# Patient Record
Sex: Female | Born: 1990 | Race: White | Hispanic: No | Marital: Single | Smoking: Former smoker
Health system: Southern US, Community
[De-identification: ages and names within clinical notes are randomized; demographics above are authoritative.]

## PROBLEM LIST (undated history)

## (undated) ENCOUNTER — Inpatient Hospital Stay: Payer: Self-pay

## (undated) DIAGNOSIS — M419 Scoliosis, unspecified: Secondary | ICD-10-CM

## (undated) DIAGNOSIS — J45909 Unspecified asthma, uncomplicated: Secondary | ICD-10-CM

## (undated) DIAGNOSIS — K589 Irritable bowel syndrome without diarrhea: Secondary | ICD-10-CM

## (undated) DIAGNOSIS — L732 Hidradenitis suppurativa: Secondary | ICD-10-CM

## (undated) DIAGNOSIS — N809 Endometriosis, unspecified: Secondary | ICD-10-CM

## (undated) DIAGNOSIS — M5430 Sciatica, unspecified side: Secondary | ICD-10-CM

## (undated) DIAGNOSIS — G932 Benign intracranial hypertension: Secondary | ICD-10-CM

## (undated) DIAGNOSIS — N1330 Unspecified hydronephrosis: Secondary | ICD-10-CM

## (undated) DIAGNOSIS — G43909 Migraine, unspecified, not intractable, without status migrainosus: Secondary | ICD-10-CM

## (undated) HISTORY — DX: Unspecified hydronephrosis: N13.30

## (undated) HISTORY — PX: TONSILLECTOMY: SUR1361

## (undated) HISTORY — PX: CHOLECYSTECTOMY: SHX55

## (undated) HISTORY — DX: Irritable bowel syndrome, unspecified: K58.9

## (undated) HISTORY — DX: Scoliosis, unspecified: M41.9

## (undated) HISTORY — PX: COLONOSCOPY: SHX174

## (undated) HISTORY — DX: Hidradenitis suppurativa: L73.2

---
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
## 2012-01-17 ENCOUNTER — Ambulatory Visit: Payer: Self-pay | Admitting: Physician Assistant

## 2012-04-20 ENCOUNTER — Ambulatory Visit: Payer: Self-pay | Admitting: Obstetrics and Gynecology

## 2012-04-20 DIAGNOSIS — M4120 Other idiopathic scoliosis, site unspecified: Secondary | ICD-10-CM | POA: Insufficient documentation

## 2012-07-04 ENCOUNTER — Ambulatory Visit: Payer: Self-pay

## 2012-09-08 ENCOUNTER — Emergency Department: Payer: Self-pay | Admitting: Emergency Medicine

## 2013-09-14 ENCOUNTER — Ambulatory Visit: Payer: Self-pay | Admitting: Physician Assistant

## 2013-11-14 ENCOUNTER — Emergency Department: Payer: Self-pay | Admitting: Emergency Medicine

## 2013-12-26 ENCOUNTER — Emergency Department: Payer: Self-pay | Admitting: Emergency Medicine

## 2014-01-02 ENCOUNTER — Emergency Department: Payer: Self-pay | Admitting: Emergency Medicine

## 2014-01-04 ENCOUNTER — Emergency Department: Payer: Self-pay | Admitting: Student

## 2014-01-05 ENCOUNTER — Emergency Department: Payer: Self-pay | Admitting: Student

## 2014-01-29 ENCOUNTER — Emergency Department: Payer: Self-pay | Admitting: Emergency Medicine

## 2014-02-24 ENCOUNTER — Emergency Department: Payer: Self-pay | Admitting: Internal Medicine

## 2014-04-01 ENCOUNTER — Emergency Department: Payer: Self-pay | Admitting: Physician Assistant

## 2014-04-04 ENCOUNTER — Emergency Department: Payer: Self-pay | Admitting: Emergency Medicine

## 2014-04-04 LAB — CBC
HCT: 45.6 % (ref 35.0–47.0)
HGB: 15.1 g/dL (ref 12.0–16.0)
MCH: 30.5 pg (ref 26.0–34.0)
MCHC: 33.1 g/dL (ref 32.0–36.0)
MCV: 92 fL (ref 80–100)
Platelet: 210 10*3/uL (ref 150–440)
RBC: 4.95 10*6/uL (ref 3.80–5.20)
RDW: 13.6 % (ref 11.5–14.5)
WBC: 8.4 10*3/uL (ref 3.6–11.0)

## 2014-04-04 LAB — GC/CHLAMYDIA PROBE AMP

## 2014-04-04 LAB — WET PREP, GENITAL

## 2014-05-25 ENCOUNTER — Emergency Department: Payer: Self-pay | Admitting: Emergency Medicine

## 2014-05-25 LAB — CBC WITH DIFFERENTIAL/PLATELET
Basophil #: 0.1 10*3/uL (ref 0.0–0.1)
Basophil %: 0.7 %
Eosinophil #: 0.4 10*3/uL (ref 0.0–0.7)
Eosinophil %: 4.4 %
HCT: 44.7 % (ref 35.0–47.0)
HGB: 15.1 g/dL (ref 12.0–16.0)
Lymphocyte #: 1.5 10*3/uL (ref 1.0–3.6)
Lymphocyte %: 18 %
MCH: 30.6 pg (ref 26.0–34.0)
MCHC: 33.9 g/dL (ref 32.0–36.0)
MCV: 91 fL (ref 80–100)
MONO ABS: 0.5 x10 3/mm (ref 0.2–0.9)
Monocyte %: 5.8 %
NEUTROS ABS: 6 10*3/uL (ref 1.4–6.5)
Neutrophil %: 71.1 %
Platelet: 228 10*3/uL (ref 150–440)
RBC: 4.94 10*6/uL (ref 3.80–5.20)
RDW: 13 % (ref 11.5–14.5)
WBC: 8.5 10*3/uL (ref 3.6–11.0)

## 2014-05-25 LAB — BASIC METABOLIC PANEL
Anion Gap: 4 — ABNORMAL LOW (ref 7–16)
BUN: 14 mg/dL
CHLORIDE: 110 mmol/L
CO2: 26 mmol/L
CREATININE: 0.81 mg/dL
Calcium, Total: 9.2 mg/dL
EGFR (Non-African Amer.): >60
Glucose: 94 mg/dL
POTASSIUM: 3.9 mmol/L
Sodium: 140 mmol/L

## 2014-06-19 ENCOUNTER — Ambulatory Visit
Admit: 2014-06-19 | Disposition: A | Discharge status: Home / Self Care | Payer: Self-pay | Attending: Family Medicine | Admitting: Family Medicine

## 2014-07-18 ENCOUNTER — Other Ambulatory Visit: Payer: Self-pay | Admitting: Family Medicine

## 2014-10-17 DIAGNOSIS — J45901 Unspecified asthma with (acute) exacerbation: Secondary | ICD-10-CM | POA: Insufficient documentation

## 2014-10-17 DIAGNOSIS — Z720 Tobacco use: Secondary | ICD-10-CM | POA: Insufficient documentation

## 2014-10-17 DIAGNOSIS — R079 Chest pain, unspecified: Secondary | ICD-10-CM | POA: Diagnosis not present

## 2014-10-17 DIAGNOSIS — F329 Major depressive disorder, single episode, unspecified: Secondary | ICD-10-CM | POA: Diagnosis not present

## 2014-10-17 DIAGNOSIS — F419 Anxiety disorder, unspecified: Secondary | ICD-10-CM | POA: Insufficient documentation

## 2014-10-18 ENCOUNTER — Emergency Department
Admission: EM | Admit: 2014-10-18 | Discharge: 2014-10-18 | Disposition: A | Discharge status: Home / Self Care | Payer: 59 | Attending: Emergency Medicine | Admitting: Emergency Medicine

## 2014-10-18 ENCOUNTER — Encounter: Payer: Self-pay | Admitting: Urgent Care

## 2014-10-18 DIAGNOSIS — F32A Depression, unspecified: Secondary | ICD-10-CM

## 2014-10-18 DIAGNOSIS — F419 Anxiety disorder, unspecified: Secondary | ICD-10-CM

## 2014-10-18 DIAGNOSIS — F329 Major depressive disorder, single episode, unspecified: Secondary | ICD-10-CM

## 2014-10-18 HISTORY — DX: Endometriosis, unspecified: N80.9

## 2014-10-18 HISTORY — DX: Unspecified asthma, uncomplicated: J45.909

## 2014-10-18 LAB — BASIC METABOLIC PANEL WITH GFR
Anion gap: 6 (ref 5–15)
BUN: 13 mg/dL (ref 6–20)
CO2: 28 mmol/L (ref 22–32)
Calcium: 9 mg/dL (ref 8.9–10.3)
Chloride: 108 mmol/L (ref 101–111)
Creatinine, Ser: 1.07 mg/dL — ABNORMAL HIGH (ref 0.44–1.00)
GFR calc Af Amer: >60 mL/min
GFR calc non Af Amer: >60 mL/min
Glucose, Bld: 79 mg/dL (ref 65–99)
Potassium: 3.8 mmol/L (ref 3.5–5.1)
Sodium: 142 mmol/L (ref 135–145)

## 2014-10-18 LAB — CBC
HCT: 42.8 % (ref 35.0–47.0)
Hemoglobin: 14.1 g/dL (ref 12.0–16.0)
MCH: 29.6 pg (ref 26.0–34.0)
MCHC: 33 g/dL (ref 32.0–36.0)
MCV: 89.9 fL (ref 80.0–100.0)
PLATELETS: 235 10*3/uL (ref 150–440)
RBC: 4.76 MIL/uL (ref 3.80–5.20)
RDW: 13.4 % (ref 11.5–14.5)
WBC: 11.3 10*3/uL — AB (ref 3.6–11.0)

## 2014-10-18 LAB — TROPONIN I: Troponin I: <0.03 ng/mL (ref <0.031)

## 2014-10-18 MED ORDER — CLONAZEPAM 0.5 MG PO TABS: 0.5000 mg | ORAL_TABLET | Freq: Three times a day (TID) | ORAL | Status: DC | PRN

## 2014-10-18 NOTE — ED Notes (Signed)
Pt seen by ED MD, see their notes for assessment.

## 2014-10-18 NOTE — Discharge Instructions (Signed)
1. You may take anxiety medicine as needed (Klonopin #15). 2. Return to the ER for worsening symptoms, persistent vomiting, difficulty breathing or other concerns. Please return to the ER if you're having thoughts of hurting herself or others.  Panic Attacks Panic attacks are sudden, short-livedsurges of severe anxiety, fear, or discomfort. They may occur for no reason when you are relaxed, when you are anxious, or when you are sleeping. Panic attacks may occur for a number of reasons:  1. Healthy people occasionally have panic attacks in extreme, life-threatening situations, such as war or natural disasters. Normal anxiety is a protective mechanism of the body that helps [place] react to danger (fight or flight response). 2. Panic attacks are often seen with anxiety disorders, such as panic disorder, social anxiety disorder, generalized anxiety disorder, and phobias. Anxiety disorders cause excessive or uncontrollable anxiety. They may interfere with your relationships or other life activities. 3. Panic attacks are sometimes seen with other mental illnesses, such as depression and posttraumatic stress disorder. 4. Certain medical conditions, prescription medicines, and drugs of abuse can cause panic attacks. SYMPTOMS  Panic attacks start suddenly, peak within 20 minutes, and are accompanied by four or more of the following symptoms:  Pounding heart or fast heart rate (palpitations).  Sweating.  Trembling or shaking.  Shortness of breath or feeling smothered.  Feeling choked.  Chest pain or discomfort.  Nausea or strange feeling in your stomach.  Dizziness, light-headedness, or feeling like you will faint.  Chills or hot flushes.  Numbness or tingling in your lips or hands and feet.  Feeling that things are not real or feeling that you are not yourself.  Fear of losing control or going crazy.  Fear of dying. Some of these symptoms can mimic serious medical conditions. For example,  you may think you are having a heart attack. Although panic attacks can be very scary, they are not life threatening. DIAGNOSIS  Panic attacks are diagnosed through an assessment by your health care provider. Your health care provider will ask questions about your symptoms, such as where and when they occurred. Your health care provider will also ask about your medical history and use of alcohol and drugs, including prescription medicines. Your health care provider may order blood tests or other studies to rule out a serious medical condition. Your health care provider may refer you to a mental health professional for further evaluation. TREATMENT   Most healthy people who have one or two panic attacks in an extreme, life-threatening situation will not require treatment.  The treatment for panic attacks associated with anxiety disorders or other mental illness typically involves counseling with a mental health professional, medicine, or a combination of both. Your health care provider will help determine what treatment is best for you.  Panic attacks due to physical illness usually go away with treatment of the illness. If prescription medicine is causing panic attacks, talk with your health care provider about stopping the medicine, decreasing the dose, or substituting another medicine.  Panic attacks due to alcohol or drug abuse go away with abstinence. Some adults need professional help in order to stop drinking or using drugs. HOME CARE INSTRUCTIONS   Take all medicines as directed by your health care provider.   Schedule and attend follow-up visits as directed by your health care provider. It is important to keep all your appointments. SEEK MEDICAL CARE IF:  You are not able to take your medicines as prescribed.  Your symptoms do not improve or  get worse. SEEK IMMEDIATE MEDICAL CARE IF:   You experience panic attack symptoms that are different than your usual symptoms.  You have  serious thoughts about hurting yourself or others.  You are taking medicine for panic attacks and have a serious side effect. MAKE SURE YOU:  Understand these instructions.  Will watch your condition.  Will get help right away if you are not doing well or get worse. Document Released: 02/17/2005 Document Revised: 02/22/2013 Document Reviewed: 10/01/2012 [facility] Patient Information 2015 [place], [place]. This information is not intended to replace advice given to you by your health care provider. Make sure you discuss any questions you have with your health care provider.  Depression Depression refers to feeling sad, low, down in the dumps, blue, gloomy, or empty. In general, there are two kinds of depression: 5. Normal sadness or normal grief. This kind of depression is one that we all feel from time to time after upsetting life experiences, such as the loss of a job or the ending of a relationship. This kind of depression is considered normal, is short lived, and resolves within a few days to 2 weeks. Depression experienced after the loss of a loved one (bereavement) often lasts longer than 2 weeks but normally gets better with time. 6. Clinical depression. This kind of depression lasts longer than normal sadness or normal grief or interferes with your ability to function at home, at work, and in school. It also interferes with your personal relationships. It affects almost every aspect of your life. Clinical depression is an illness. Symptoms of depression can also be caused by conditions other than those mentioned above, such as:  Physical illness. Some physical illnesses, including underactive thyroid gland (hypothyroidism), severe anemia, specific types of cancer, diabetes, uncontrolled seizures, heart and lung problems, strokes, and chronic pain are commonly associated with symptoms of depression.  Side effects of some prescription medicine. In some people, certain types of medicine can  cause symptoms of depression.  Substance abuse. Abuse of alcohol and illicit drugs can cause symptoms of depression. SYMPTOMS Symptoms of normal sadness and normal grief include the following:  Feeling sad or crying for short periods of time.  Not caring about anything (apathy).  Difficulty sleeping or sleeping too much.  No longer able to enjoy the things you used to enjoy.  Desire to be by oneself all the time (social isolation).  Lack of energy or motivation.  Difficulty concentrating or remembering.  Change in appetite or weight.  Restlessness or agitation. Symptoms of clinical depression include the same symptoms of normal sadness or normal grief and also the following symptoms:  Feeling sad or crying all the time.  Feelings of guilt or worthlessness.  Feelings of hopelessness or helplessness.  Thoughts of suicide or the desire to harm yourself (suicidal ideation).  Loss of touch with reality (psychotic symptoms). Seeing or hearing things that are not real (hallucinations) or having false beliefs about your life or the people around you (delusions and paranoia). DIAGNOSIS  The diagnosis of clinical depression is usually based on how bad the symptoms are and how long they have lasted. Your health care provider will also ask you questions about your medical history and substance use to find out if physical illness, use of prescription medicine, or substance abuse is causing your depression. Your health care provider may also order blood tests. TREATMENT  Often, normal sadness and normal grief do not require treatment. However, sometimes antidepressant medicine is given for bereavement to ease  the depressive symptoms until they resolve. The treatment for clinical depression depends on how bad the symptoms are but often includes antidepressant medicine, counseling with a mental health professional, or both. Your health care provider will help to determine what treatment is best  for you. Depression caused by physical illness usually goes away with appropriate medical treatment of the illness. If prescription medicine is causing depression, talk with your health care provider about stopping the medicine, decreasing the dose, or changing to another medicine. Depression caused by the abuse of alcohol or illicit drugs goes away when you stop using these substances. Some adults need professional help in order to stop drinking or using drugs. SEEK IMMEDIATE MEDICAL CARE IF:  You have thoughts about hurting yourself or others.  You lose touch with reality (have psychotic symptoms).  You are taking medicine for depression and have a serious side effect. FOR MORE INFORMATION  National Alliance on Mental Illness: www.nami.[employer] of Mental Health: http://www.maynard.net/ Document Released: 02/15/2000 Document Revised: 07/04/2013 Document Reviewed: 05/19/2011 [facility] Patient Information 2015 [place], [place]. This information is not intended to replace advice given to you by your health care provider. Make sure you discuss any questions you have with your health care provider.
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
## 2014-10-18 NOTE — ED Notes (Signed)
Patient presents with c/o chest pressure with (+) SOB and palpitations that began last night. Patient with remote h/o anxiety, however no longer is on medications.

## 2014-10-21 ENCOUNTER — Emergency Department
Admission: EM | Admit: 2014-10-21 | Discharge: 2014-10-21 | Disposition: A | Discharge status: Home / Self Care | Payer: 59 | Attending: Emergency Medicine | Admitting: Emergency Medicine

## 2014-10-21 DIAGNOSIS — M5416 Radiculopathy, lumbar region: Secondary | ICD-10-CM | POA: Insufficient documentation

## 2014-10-21 DIAGNOSIS — Z720 Tobacco use: Secondary | ICD-10-CM | POA: Diagnosis not present

## 2014-10-21 DIAGNOSIS — M545 Low back pain: Secondary | ICD-10-CM | POA: Diagnosis present

## 2014-10-21 MED ORDER — TRAMADOL HCL 50 MG PO TABS: 50.0000 mg | ORAL_TABLET | Freq: Four times a day (QID) | ORAL | Status: DC | PRN

## 2014-10-21 MED ORDER — PREDNISONE 20 MG PO TABS
60.0000 mg | ORAL_TABLET | Freq: Once | ORAL | Status: AC
  Administered 2014-10-21: 60 mg via ORAL
  Filled 2014-10-21: qty 3

## 2014-10-21 MED ORDER — PREDNISONE 10 MG PO TABS: 10.0000 mg | ORAL_TABLET | Freq: Every day | ORAL | Status: DC

## 2014-10-21 MED ORDER — TRAMADOL HCL 50 MG PO TABS
100.0000 mg | ORAL_TABLET | Freq: Once | ORAL | Status: AC
  Administered 2014-10-21: 100 mg via ORAL
  Filled 2014-10-21: qty 2

## 2014-10-21 NOTE — Discharge Instructions (Signed)
Back Exercises Back exercises help treat and prevent back injuries. The goal of back exercises is to increase the strength of your abdominal and back muscles and the flexibility of your back. These exercises should be started when you no longer have back pain. Back exercises include:  Pelvic Tilt. Lie on your back with your knees bent. Tilt your pelvis until the lower part of your back is against the floor. Hold this position 5 to 10 sec and repeat 5 to 10 times.  Knee to Chest. Pull first 1 knee up against your chest and hold for 20 to 30 seconds, repeat this with the other knee, and then both knees. This may be done with the other leg straight or bent, whichever feels better.  Sit-Ups or Curl-Ups. Bend your knees 90 degrees. Start with tilting your pelvis, and do a partial, slow sit-up, lifting your trunk only 30 to 45 degrees off the floor. Take at least 2 to 3 seconds for each sit-up. Do not do sit-ups with your knees out straight. If partial sit-ups are difficult, simply do the above but with only tightening your abdominal muscles and holding it as directed.  Hip-Lift. Lie on your back with your knees flexed 90 degrees. Push down with your feet and shoulders as you raise your hips a couple inches off the floor; hold for 10 seconds, repeat 5 to 10 times.  Back arches. Lie on your stomach, propping yourself up on bent elbows. Slowly press on your hands, causing an arch in your low back. Repeat 3 to 5 times. Any initial stiffness and discomfort should lessen with repetition over time.  Shoulder-Lifts. Lie face down with arms beside your body. Keep hips and torso pressed to floor as you slowly lift your head and shoulders off the floor. Do not overdo your exercises, especially in the beginning. Exercises may cause you some mild back discomfort which lasts for a few minutes; however, if the pain is more severe, or lasts for more than 15 minutes, do not continue exercises until you see your caregiver.  Improvement with exercise therapy for back problems is slow.  See your caregivers for assistance with developing a proper back exercise program. Document Released: 03/27/2004 Document Revised: 05/12/2011 Document Reviewed: 12/19/2010 [facility] Patient Information 2015 [place], [place]. This information is not intended to replace advice given to you by your health care provider. Make sure you discuss any questions you have with your health care provider.  Lumbosacral Radiculopathy Lumbosacral radiculopathy is a pinched nerve or nerves in the low back (lumbosacral area). When this happens you may have weakness in your legs and may not be able to stand on your toes. You may have pain going down into your legs. There may be difficulties with walking normally. There are many causes of this problem. Sometimes this may happen from an injury, or simply from arthritis or boney problems. It may also be caused by other illnesses such as diabetes. If there is no improvement after treatment, further studies may be done to find the exact cause. DIAGNOSIS  X-rays may be needed if the problems become long standing. Electromyograms may be done. This study is one in which the working of nerves and muscles is studied. HOME CARE INSTRUCTIONS   Applications of ice packs may be helpful. Ice can be used in a plastic bag with a towel around it to prevent frostbite to skin. This may be used every 2 hours for 20 to 30 minutes, or as needed, while awake, or  as directed by your caregiver. °· Only take over-the-counter or prescription medicines for pain, discomfort, or fever as directed by your caregiver. °· If physical therapy was prescribed, follow your caregiver's directions. °SEEK IMMEDIATE MEDICAL CARE IF:  °· You have pain not controlled with medications. °· You seem to be getting worse rather than better. °· You develop increasing weakness in your legs. °· You develop loss of bowel or bladder control. °· You have difficulty with  walking or balance, or develop clumsiness in the use of your legs. °· You have a fever. °MAKE SURE YOU:  °· Understand these instructions. °· Will watch your condition. °· Will get help right away if you are not doing well or get worse. °Document Released: 02/17/2005 Document Revised: 05/12/2011 Document Reviewed: 10/08/2007 °[employer]® Patient Information ©2015 [employer]. This information is not intended to replace advice given to you by your health care provider. Make sure you discuss any questions you have with your health care provider. ° °

## 2014-10-21 NOTE — ED Notes (Signed)
Pt c/o lower back pain radiating down left leg since last night. Pain has continued to increase. Reports lifting pt at work prior to symptoms.

## 2014-10-21 NOTE — ED Provider Notes (Signed)
CSN: [id number]     Arrival date & time 10/21/14  1951 History   First MD Initiated Contact with Patient 10/21/14 2236     Chief Complaint  Patient presents with  . Back Pain    Pt c/o pain to lower back radiating down left leg since Friday night. Pain has continued to increase. Reports lifting pt at work prior to symptoms.     (Consider location/radiation/quality/duration/timing/severity/associated sxs/prior Treatment) HPI  24 year old female presents to the emergency department for evaluation of left lower back pain with radiation down the left posterior lateral thigh. Symptoms have been present for 1 day. She describes the pain as pain numbness and tingling. Pain is constant and increased with activity No trauma or injury. She has had spinal fusion due to scoliosis and 2008. No complications from the surgery. She denies any weakness, abdominal pain, dysuria, loss of bowel or bladder symptoms. She's been taking Tylenol without any relief.  Past Medical History  Diagnosis Date  . Asthma   . Endometriosis    Past Surgical History  Procedure Laterality Date  . Spinal fusion    . Tonsillectomy     No family history on file. Social History  Substance Use Topics  . Smoking status: Current Every Day Smoker  . Smokeless tobacco: Not on file  . Alcohol Use: Yes   OB History    No data available     Review of Systems  Constitutional: Negative for fever, chills, activity change and fatigue.  HENT: Negative for congestion, sinus pressure and sore throat.   Eyes: Negative for visual disturbance.  Respiratory: Negative for cough, chest tightness and shortness of breath.   Cardiovascular: Negative for chest pain and leg swelling.  Gastrointestinal: Negative for nausea, vomiting, abdominal pain and diarrhea.  Genitourinary: Negative for dysuria.  Musculoskeletal: Positive for back pain. Negative for arthralgias and gait problem.  Skin: Negative for rash.  Neurological: Negative for  weakness, numbness and headaches.  Hematological: Negative for adenopathy.  Psychiatric/Behavioral: Negative for behavioral problems, confusion and agitation.      Allergies  Other  Home Medications   Prior to Admission medications   Medication Sig Start Date End Date Taking? Authorizing Provider  clonazePAM (KLONOPIN) 0.5 MG tablet Take 1 tablet (0.5 mg total) by mouth 3 (three) times daily as needed for anxiety. 10/18/14 10/18/15  Irean Hong, MD  predniSONE (DELTASONE) 10 MG tablet Take 1 tablet (10 mg total) by mouth daily. 6,5,4,3,2,1 six day taper 10/21/14   Evon Slack, PA-C  traMADol (ULTRAM) 50 MG tablet Take 1 tablet (50 mg total) by mouth every 6 (six) hours as needed. 10/21/14   Evon Slack, PA-C   BP 148/86 mmHg  Pulse 84  Temp(Src) 98 F (36.7 C) (Oral)  Resp 18  Ht 5\' 6"  (1.676 m)  Wt 220 lb (99.791 kg)  BMI 35.53 kg/m2  SpO2 99% Physical Exam  Constitutional: She is oriented to person, place, and time. She appears well-developed and well-nourished. No distress.  HENT:  Head: Normocephalic and atraumatic.  Mouth/Throat: Oropharynx is clear and moist.  Eyes: EOM are normal. Pupils are equal, round, and reactive to light. Right eye exhibits no discharge. Left eye exhibits no discharge.  Neck: Normal range of motion. Neck supple.  Cardiovascular: Normal rate, regular rhythm and intact distal pulses.   Pulmonary/Chest: Effort normal and breath sounds normal. No respiratory distress. She exhibits no tenderness.  Abdominal: Soft. She exhibits no distension. There is no tenderness.  Musculoskeletal:  Examination of the lumbar spine shows patient has no spinous process tenderness. There is left paravertebral muscle tenderness at the lumbosacral junction. She has lived range of motion of the lumbar spine with flexion and extension. Examination of the lower extremity shows patient has full range of motion of the hips knees and ankles. She is able to stand. 2+ patellar  reflexes. No weakness of the lower extremities. No sensation loss.  Neurological: She is alert and oriented to person, place, and time. She has normal reflexes.  Skin: Skin is warm and dry.  Psychiatric: She has a normal mood and affect. Her behavior is normal. Thought content normal.    ED Course  Procedures (including critical care time) Labs Review Labs Reviewed - No data to display  Imaging Review No results found. I have personally reviewed and evaluated these images and lab results as part of my medical decision-making.   EKG Interpretation None      MDM   Final diagnoses:  Acute left lumbar radiculopathy    24 year old female with 1 day history of acute left lumbar radiculopathy. She has pain numbness tingling rating down the left L5 nerve distribution without weakness or neurological deficits on exam. Patient was given 6 day prednisone taper and tramadol for pain and inflammation. She'll follow-up with orthopedics in 5-7 days if no improvement. She will return to the ER for any worsening symptoms urgent changes in her health    Evon Slack, PA-C 10/21/14 2259  Loleta Rose, MD 10/22/14 [phone]

## 2014-10-26 ENCOUNTER — Emergency Department
Admission: EM | Admit: 2014-10-26 | Discharge: 2014-10-27 | Disposition: A | Discharge status: Home / Self Care | Payer: 59 | Attending: Emergency Medicine | Admitting: Emergency Medicine

## 2014-10-26 DIAGNOSIS — M545 Low back pain: Secondary | ICD-10-CM | POA: Diagnosis present

## 2014-10-26 DIAGNOSIS — Z720 Tobacco use: Secondary | ICD-10-CM | POA: Diagnosis not present

## 2014-10-26 DIAGNOSIS — Z791 Long term (current) use of non-steroidal anti-inflammatories (NSAID): Secondary | ICD-10-CM | POA: Diagnosis not present

## 2014-10-26 DIAGNOSIS — M5416 Radiculopathy, lumbar region: Secondary | ICD-10-CM | POA: Diagnosis not present

## 2014-10-26 DIAGNOSIS — Z3202 Encounter for pregnancy test, result negative: Secondary | ICD-10-CM | POA: Diagnosis not present

## 2014-10-26 NOTE — ED Notes (Signed)
Pt complains of back pain since around the first time she came here on the 17th. She says the pain is getting progressively worse. It is going into left leg and states 'her finger tips and hand will start tingling' states it just stays on left side. States her 'left butt check even tingles' Pt states the last time she was here they said she had a pinched nerve and gave tramadol and prednisone. Pt says it has not touched the pain. Pt states she has taken Epson salt baths as well

## 2014-10-27 LAB — URINALYSIS COMPLETE WITH MICROSCOPIC (ARMC ONLY)
BILIRUBIN URINE: NEGATIVE
GLUCOSE, UA: NEGATIVE mg/dL
Hgb urine dipstick: NEGATIVE
Ketones, ur: NEGATIVE mg/dL
Leukocytes, UA: NEGATIVE
Nitrite: NEGATIVE
Protein, ur: NEGATIVE mg/dL
Specific Gravity, Urine: 1.025 (ref 1.005–1.030)
pH: 5 (ref 5.0–8.0)

## 2014-10-27 LAB — POCT PREGNANCY, URINE: PREG TEST UR: NEGATIVE

## 2014-10-27 MED ORDER — CYCLOBENZAPRINE HCL 10 MG PO TABS: 10.0000 mg | ORAL_TABLET | Freq: Three times a day (TID) | ORAL | Status: DC | PRN

## 2014-10-27 MED ORDER — CYCLOBENZAPRINE HCL 10 MG PO TABS
10.0000 mg | ORAL_TABLET | Freq: Once | ORAL | Status: AC
  Administered 2014-10-27: 10 mg via ORAL
  Filled 2014-10-27: qty 1

## 2014-10-27 MED ORDER — KETOROLAC TROMETHAMINE 10 MG PO TABS
10.0000 mg | ORAL_TABLET | Freq: Once | ORAL | Status: AC
  Administered 2014-10-27: 10 mg via ORAL
  Filled 2014-10-27: qty 1

## 2014-10-27 NOTE — Discharge Instructions (Signed)

## 2014-10-27 NOTE — ED Notes (Signed)
Pt reports severe lower left back pain w/ radiation down left leg. 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 Pt is A&O, in NAD, respirations even and unlabored, with s/o at bedside.

## 2014-10-27 NOTE — ED Provider Notes (Signed)
[facility] Emergency Department Provider Note  ____________________________________________  Time seen: 1:15 AM  I have reviewed the triage vital signs and the nursing notes.   HISTORY  Chief Complaint Back Pain     HPI Courtney Sanchez is a 24 y.o. female presents with left low back pain with radiation down the posterior left leg 6 days. Patient denies any leg weakness. Of note patient was seen on 10/21/2014 for the same and referred to PMD/orthopedist however patient was unable to make that appointment.     Past Medical History  Diagnosis Date  . Asthma   . Endometriosis     There are no active problems to display for this patient.   Past Surgical History  Procedure Laterality Date  . Spinal fusion    . Tonsillectomy      Current Outpatient Rx  Name  Route  Sig  Dispense  Refill  . clonazePAM (KLONOPIN) 0.5 MG tablet   Oral   Take 1 tablet (0.5 mg total) by mouth 3 (three) times daily as needed for anxiety.   15 tablet   0   . predniSONE (DELTASONE) 10 MG tablet   Oral   Take 1 tablet (10 mg total) by mouth daily. 6,5,4,3,2,1 six day taper   21 tablet   0   . traMADol (ULTRAM) 50 MG tablet   Oral   Take 1 tablet (50 mg total) by mouth every 6 (six) hours as needed.   20 tablet   0     Allergies Other  No family history on file.  Social History Social History  Substance Use Topics  . Smoking status: Current Every Day Smoker  . Smokeless tobacco: None  . Alcohol Use: Yes    Review of Systems  Constitutional: Negative for fever. Eyes: Negative for visual changes. ENT: Negative for sore throat. Cardiovascular: Negative for chest pain. Respiratory: Negative for shortness of breath. Gastrointestinal: Negative for abdominal pain, vomiting and diarrhea. Genitourinary: Negative for dysuria. Musculoskeletal: Positive for back pain. Skin: Negative for rash. Neurological: Negative for headaches, focal weakness or  numbness.   10-point ROS otherwise negative.  ____________________________________________   PHYSICAL EXAM:  VITAL SIGNS: ED Triage Vitals  Enc Vitals Group     BP 10/26/14 2220 130/69 mmHg     Pulse Rate 10/26/14 2220 87     Resp 10/27/14 0050 16     Temp 10/26/14 2220 98.4 F (36.9 C)     Temp Source 10/26/14 2220 Oral     SpO2 10/26/14 2220 98 %     Weight 10/26/14 2220 220 lb (99.791 kg)     Height 10/26/14 2220 5\' 6"  (1.676 m)     Head Cir --      Peak Flow --      Pain Score 10/26/14 2220 8     Pain Loc --      Pain Edu? --      Excl. in GC? --      Constitutional: Alert and oriented. Well appearing and in no distress. Eyes: Conjunctivae are normal. PERRL. Normal extraocular movements. ENT   Head: Normocephalic and atraumatic.   Nose: No congestion/rhinnorhea.   Mouth/Throat: Mucous membranes are moist.   Neck: No stridor. Cardiovascular: Normal rate, regular rhythm. Normal and symmetric distal pulses are present in all extremities. No murmurs, rubs, or gallops. Respiratory: Normal respiratory effort without tachypnea nor retractions. Breath sounds are clear and equal bilaterally. No wheezes/rales/rhonchi. Gastrointestinal: Soft and nontender. No distention. There is  no CVA tenderness. Genitourinary: deferred Musculoskeletal: Nontender with normal range of motion in all extremities. No joint effusions.  No lower extremity tenderness nor edema. No palpation left paraspinal muscles in the lumbar region. Neurologic:  Normal speech and language. No gross focal neurologic deficits are appreciated. Speech is normal.  Skin:  Skin is warm, dry and intact. No rash noted. Psychiatric: Mood and affect are normal. Speech and behavior are normal. Patient exhibits appropriate insight and judgment.  ____________________________________________    LABS (pertinent positives/negatives)  Labs Reviewed  URINALYSIS COMPLETEWITH MICROSCOPIC ([facility] ONLY) - Abnormal;  Notable for the following:    Color, Urine YELLOW (*)    APPearance CLOUDY (*)    Bacteria, UA RARE (*)    Squamous Epithelial / LPF 6-30 (*)    All other components within normal limits     INITIAL IMPRESSION / ASSESSMENT AND PLAN / ED COURSE  Pertinent labs & imaging results that were available during my care of the patient were reviewed by me and considered in my medical decision making (see chart for details).    ____________________________________________   FINAL CLINICAL IMPRESSION(S) / ED DIAGNOSES  Final diagnoses:  Lumbar radiculopathy, acute      Darci Current, MD 10/27/14 [phone]

## 2014-10-27 NOTE — ED Notes (Signed)
POC Urine preg resulted= NEGATIVE 

## 2014-10-27 NOTE — ED Notes (Signed)
MD at bedside. 

## 2014-12-20 ENCOUNTER — Emergency Department
Admission: EM | Admit: 2014-12-20 | Discharge: 2014-12-20 | Disposition: A | Discharge status: Home / Self Care | Payer: 59 | Attending: Emergency Medicine | Admitting: Emergency Medicine

## 2014-12-20 ENCOUNTER — Encounter: Payer: Self-pay | Admitting: *Deleted

## 2014-12-20 DIAGNOSIS — Z720 Tobacco use: Secondary | ICD-10-CM | POA: Insufficient documentation

## 2014-12-20 DIAGNOSIS — R21 Rash and other nonspecific skin eruption: Secondary | ICD-10-CM | POA: Diagnosis present

## 2014-12-20 DIAGNOSIS — B356 Tinea cruris: Secondary | ICD-10-CM | POA: Diagnosis not present

## 2014-12-20 MED ORDER — NAFTIFINE HCL 1 % EX CREA: TOPICAL_CREAM | Freq: Every day | CUTANEOUS | Status: DC

## 2014-12-20 NOTE — ED Provider Notes (Signed)
[facility] Emergency Department Provider Note  ____________________________________________  Time seen: Approximately 6:49 PM  I have reviewed the triage vital signs and the nursing notes.   HISTORY  Chief Complaint Rash    HPI Courtney Sanchez is a 24 y.o. female presents for evaluation of a rash in her groin area 2 weeks.   Past Medical History  Diagnosis Date  . Asthma   . Endometriosis     There are no active problems to display for this patient.   Past Surgical History  Procedure Laterality Date  . Spinal fusion    . Tonsillectomy      Current Outpatient Rx  Name  Route  Sig  Dispense  Refill  . clonazePAM (KLONOPIN) 0.5 MG tablet   Oral   Take 1 tablet (0.5 mg total) by mouth 3 (three) times daily as needed for anxiety.   15 tablet   0   . cyclobenzaprine (FLEXERIL) 10 MG tablet   Oral   Take 1 tablet (10 mg total) by mouth 3 (three) times daily as needed for muscle spasms.   30 tablet   0   . naftifine (NAFTIN) 1 % cream   Topical   Apply topically daily.   60 g   0   . predniSONE (DELTASONE) 10 MG tablet   Oral   Take 1 tablet (10 mg total) by mouth daily. 6,5,4,3,2,1 six day taper   21 tablet   0   . traMADol (ULTRAM) 50 MG tablet   Oral   Take 1 tablet (50 mg total) by mouth every 6 (six) hours as needed.   20 tablet   0     Allergies Other  No family history on file.  Social History Social History  Substance Use Topics  . Smoking status: Current Every Day Smoker  . Smokeless tobacco: None  . Alcohol Use: Yes    Review of Systems Constitutional: No fever/chills Eyes: No visual changes. ENT: No sore throat. Cardiovascular: Denies chest pain. Respiratory: Denies shortness of breath. Gastrointestinal: No abdominal pain.  No nausea, no vomiting.  No diarrhea.  No constipation. Genitourinary: Negative for dysuria. Musculoskeletal: Negative for back pain. Skin: Positive for rash in her  groin Neurological: Negative for headaches, focal weakness or numbness.  10-point ROS otherwise negative.  ____________________________________________   PHYSICAL EXAM:  VITAL SIGNS: ED Triage Vitals  Enc Vitals Group     BP 12/20/14 1746 133/85 mmHg     Pulse Rate 12/20/14 1746 100     Resp 12/20/14 1746 20     Temp 12/20/14 1746 98.6 F (37 C)     Temp Source 12/20/14 1746 Oral     SpO2 12/20/14 1746 98 %     Weight 12/20/14 1746 220 lb (99.791 kg)     Height 12/20/14 1746  (1.676 m)     Head Cir --      Peak Flow --      Pain Score 12/20/14 1747 5     Pain Loc --      Pain Edu? --      Excl. in GC? --     Constitutional: Alert and oriented. Well appearing and in no acute distress. Neurologic:  Normal speech and language. No gross focal neurologic deficits are appreciated. No gait instability. Skin:  Skin is warm, dry and intact. Erythematous rash noted on both groins with sharp demarcated borders. Psychiatric: Mood and affect are normal. Speech and behavior are normal.  ____________________________________________  LABS (all labs ordered are listed, but only abnormal results are displayed)  Labs Reviewed - No data to display ____________________________________________   PROCEDURES  Procedure(s) performed: None  Critical Care performed: No  ____________________________________________   INITIAL IMPRESSION / ASSESSMENT AND PLAN / ED COURSE  Pertinent labs & imaging results that were available during my care of the patient were reviewed by me and considered in my medical decision making (see chart for details).  Tinea cruris. Rx given for Naftin cream patient to follow up with PCP or return to ER with any worsening symptomology. Patient voices no other emergency medical complaint at this visit. ____________________________________________   FINAL CLINICAL IMPRESSION(S) / ED DIAGNOSES  Final diagnoses:  Tinea cruris      Evangeline Dakinharles M Taiga Lupinacci,  PA-C 12/20/14 1854  Jeanmarie PlantJames A McShane, MD 12/20/14 [phone]

## 2014-12-20 NOTE — ED Notes (Signed)
Pt reports a rash on inner thighs with pain and burning.  Sx for 2 weeks. Sx worse now.

## 2015-01-11 ENCOUNTER — Emergency Department
Admission: EM | Admit: 2015-01-11 | Discharge: 2015-01-12 | Disposition: A | Discharge status: Home / Self Care | Payer: 59 | Attending: Emergency Medicine | Admitting: Emergency Medicine

## 2015-01-11 DIAGNOSIS — J45909 Unspecified asthma, uncomplicated: Secondary | ICD-10-CM | POA: Insufficient documentation

## 2015-01-11 DIAGNOSIS — Z3202 Encounter for pregnancy test, result negative: Secondary | ICD-10-CM | POA: Diagnosis not present

## 2015-01-11 DIAGNOSIS — Z79899 Other long term (current) drug therapy: Secondary | ICD-10-CM | POA: Diagnosis not present

## 2015-01-11 DIAGNOSIS — Z720 Tobacco use: Secondary | ICD-10-CM | POA: Diagnosis not present

## 2015-01-11 DIAGNOSIS — J209 Acute bronchitis, unspecified: Secondary | ICD-10-CM

## 2015-01-11 DIAGNOSIS — R05 Cough: Secondary | ICD-10-CM | POA: Diagnosis present

## 2015-01-11 NOTE — ED Notes (Signed)
Pt in with co cough since last week, now having rib pain.  Also co chills and unsure of fever.

## 2015-01-12 ENCOUNTER — Emergency Department: Payer: 59

## 2015-01-12 ENCOUNTER — Encounter: Payer: Self-pay | Admitting: Emergency Medicine

## 2015-01-12 LAB — POCT PREGNANCY, URINE: Preg Test, Ur: NEGATIVE

## 2015-01-12 LAB — INFLUENZA PANEL BY PCR (TYPE A & B)
H1N1FLUPCR: NOT DETECTED
Influenza A By PCR: NEGATIVE
Influenza B By PCR: NEGATIVE

## 2015-01-12 MED ORDER — DOXYCYCLINE HYCLATE 100 MG PO TABS: 100.0000 mg | ORAL_TABLET | Freq: Two times a day (BID) | ORAL | Status: AC

## 2015-01-12 MED ORDER — DOXYCYCLINE HYCLATE 100 MG PO TABS
ORAL_TABLET | ORAL | Status: AC
  Administered 2015-01-12: 100 mg via ORAL
  Filled 2015-01-12: qty 1

## 2015-01-12 MED ORDER — ACETAMINOPHEN-CODEINE #3 300-30 MG PO TABS: 1.0000 | ORAL_TABLET | ORAL | Status: DC | PRN

## 2015-01-12 MED ORDER — ALBUTEROL SULFATE HFA 108 (90 BASE) MCG/ACT IN AERS: 2.0000 | INHALATION_SPRAY | Freq: Four times a day (QID) | RESPIRATORY_TRACT | Status: DC | PRN

## 2015-01-12 MED ORDER — AZITHROMYCIN 250 MG PO TABS
500.0000 mg | ORAL_TABLET | Freq: Once | ORAL | Status: DC
  Filled 2015-01-12: qty 2

## 2015-01-12 MED ORDER — DOXYCYCLINE HYCLATE 100 MG PO TABS
100.0000 mg | ORAL_TABLET | Freq: Once | ORAL | Status: AC
  Administered 2015-01-12: 100 mg via ORAL

## 2015-01-12 MED ORDER — ACETAMINOPHEN-CODEINE #3 300-30 MG PO TABS
1.0000 | ORAL_TABLET | Freq: Once | ORAL | Status: AC
  Administered 2015-01-12: 1 via ORAL
  Filled 2015-01-12: qty 1

## 2015-01-12 MED ORDER — PREDNISONE 20 MG PO TABS
60.0000 mg | ORAL_TABLET | Freq: Once | ORAL | Status: AC
  Administered 2015-01-12: 60 mg via ORAL
  Filled 2015-01-12: qty 3

## 2015-01-12 NOTE — ED Notes (Signed)
Patient back from  X-ray 

## 2015-01-12 NOTE — ED Notes (Signed)
Patient transported to X-ray 

## 2015-01-12 NOTE — Discharge Instructions (Signed)

## 2015-01-12 NOTE — ED Provider Notes (Signed)
[facility] Emergency Department Provider Note  ____________________________________________  Time seen: 12:00 AM  I have reviewed the triage vital signs and the nursing notes.   HISTORY  Chief Complaint Cough     HPI Katarina Arwa Yero is a 24 y.o. female presents with cough and chills times one week as well as bilateral rib pain. Patient admits to smoking cigarettes 1 pack per day.     Past Medical History  Diagnosis Date  . Asthma   . Endometriosis     There are no active problems to display for this patient.   Past Surgical History  Procedure Laterality Date  . Spinal fusion    . Tonsillectomy      Current Outpatient Rx  Name  Route  Sig  Dispense  Refill  . clonazePAM (KLONOPIN) 0.5 MG tablet   Oral   Take 1 tablet (0.5 mg total) by mouth 3 (three) times daily as needed for anxiety.   15 tablet   0   . cyclobenzaprine (FLEXERIL) 10 MG tablet   Oral   Take 1 tablet (10 mg total) by mouth 3 (three) times daily as needed for muscle spasms.   30 tablet   0   . naftifine (NAFTIN) 1 % cream   Topical   Apply topically daily.   60 g   0   . predniSONE (DELTASONE) 10 MG tablet   Oral   Take 1 tablet (10 mg total) by mouth daily. 6,5,4,3,2,1 six day taper   21 tablet   0   . traMADol (ULTRAM) 50 MG tablet   Oral   Take 1 tablet (50 mg total) by mouth every 6 (six) hours as needed.   20 tablet   0     Allergies Other  No family history on file.  Social History Social History  Substance Use Topics  . Smoking status: Current Every Day Smoker  . Smokeless tobacco: None  . Alcohol Use: Yes    Review of Systems  Constitutional: Negative for fever. Positive for chills Eyes: Negative for visual changes. ENT: Negative for sore throat. Cardiovascular: Negative for chest pain. Respiratory: Positive for cough Gastrointestinal: Negative for abdominal pain, vomiting and diarrhea. Genitourinary: Negative for  dysuria. Musculoskeletal: Negative for back pain. Skin: Negative for rash. Neurological: Negative for headaches, focal weakness or numbness.   10-point ROS otherwise negative.  ____________________________________________   PHYSICAL EXAM:  VITAL SIGNS: ED Triage Vitals  Enc Vitals Group     BP 01/11/15 2336 120/78 mmHg     Pulse Rate 01/11/15 2336 90     Resp 01/11/15 2336 18     Temp 01/11/15 2336 98.7 F (37.1 C)     Temp Source 01/11/15 2336 Oral     SpO2 01/11/15 2336 95 %     Weight 01/11/15 2336 230 lb (104.327 kg)     Height 01/11/15 2336  (1.651 m)     Head Cir --      Peak Flow --      Pain Score 01/11/15 2336 7     Pain Loc --      Pain Edu? --      Excl. in GC? --      Constitutional: Alert and oriented. Well appearing and in no distress. Eyes: Conjunctivae are normal. PERRL. Normal extraocular movements. ENT   Head: Normocephalic and atraumatic.   Nose: No congestion/rhinnorhea.   Mouth/Throat: Mucous membranes are moist.   Neck: No stridor. Hematological/Lymphatic/Immunilogical: No cervical lymphadenopathy.  Cardiovascular: Normal rate, regular rhythm. Normal and symmetric distal pulses are present in all extremities. No murmurs, rubs, or gallops. Respiratory: Normal respiratory effort without tachypnea nor retractions. Breath sounds are clear and equal bilaterally. No wheezes/rales/rhonchi. Gastrointestinal: Soft and nontender. No distention. There is no CVA tenderness. Genitourinary: deferred Musculoskeletal: Nontender with normal range of motion in all extremities. No joint effusions.  No lower extremity tenderness nor edema. Neurologic:  Normal speech and language. No gross focal neurologic deficits are appreciated. Speech is normal.  Skin:  Skin is warm, dry and intact. No rash noted. Psychiatric: Mood and affect are normal. Speech and behavior are normal. Patient exhibits appropriate insight and judgment.    RADIOLOGY      DG  Chest 2 View (Final result) Result time: 01/12/15 00:46:56   Final result by Rad Results In Interface (01/12/15 00:46:56)   Narrative:   CLINICAL DATA: Cough and chills  EXAM: CHEST 2 VIEW  COMPARISON: 01/17/2012  FINDINGS: Normal heart size and mediastinal contours. No acute infiltrate or edema. No effusion or pneumothorax. Scoliosis with post thoracolumbar fixation. No acute osseous findings.  IMPRESSION: No acute finding.   Electronically Signed By: Marnee SpringJonathon Watts M.D. On: 01/12/2015 00:46        ECG Results        INITIAL IMPRESSION / ASSESSMENT AND PLAN / ED COURSE  Pertinent labs & imaging results that were available during my care of the patient were reviewed by me and considered in my medical decision making (see chart for details).  History and physical exam consistent with acute bronchitis. Patient received Tylenol with Codeine azithromycin and prednisone.  ____________________________________________   FINAL CLINICAL IMPRESSION(S) / ED DIAGNOSES  Final diagnoses:  Acute bronchitis, unspecified organism      Darci Currentandolph N Juanantonio Stolar, MD 01/12/15 [phone]

## 2015-03-07 ENCOUNTER — Emergency Department
Admission: EM | Admit: 2015-03-07 | Discharge: 2015-03-07 | Disposition: A | Discharge status: Home / Self Care | Payer: 59 | Attending: Emergency Medicine | Admitting: Emergency Medicine

## 2015-03-07 ENCOUNTER — Encounter: Payer: Self-pay | Admitting: Urgent Care

## 2015-03-07 DIAGNOSIS — F17200 Nicotine dependence, unspecified, uncomplicated: Secondary | ICD-10-CM | POA: Insufficient documentation

## 2015-03-07 DIAGNOSIS — J069 Acute upper respiratory infection, unspecified: Secondary | ICD-10-CM | POA: Insufficient documentation

## 2015-03-07 DIAGNOSIS — J45909 Unspecified asthma, uncomplicated: Secondary | ICD-10-CM | POA: Insufficient documentation

## 2015-03-07 DIAGNOSIS — R05 Cough: Secondary | ICD-10-CM | POA: Diagnosis present

## 2015-03-07 HISTORY — DX: Migraine, unspecified, not intractable, without status migrainosus: G43.909

## 2015-03-07 MED ORDER — GUAIFENESIN-CODEINE 100-10 MG/5ML PO SOLN: 10.0000 mL | ORAL | Status: DC | PRN

## 2015-03-07 MED ORDER — AZITHROMYCIN 250 MG PO TABS: ORAL_TABLET | ORAL | Status: DC

## 2015-03-07 MED ORDER — PSEUDOEPHEDRINE HCL 60 MG PO TABS: 60.0000 mg | ORAL_TABLET | ORAL | Status: DC | PRN

## 2015-03-07 MED ORDER — IBUPROFEN 800 MG PO TABS: 800.0000 mg | ORAL_TABLET | Freq: Three times a day (TID) | ORAL | Status: DC | PRN

## 2015-03-07 NOTE — ED Provider Notes (Signed)
[facility] Emergency Department Provider Note  ____________________________________________  Time seen: Approximately 8:40 PM  I have reviewed the triage vital signs and the nursing notes.   HISTORY  Chief Complaint Cough    HPI Courtney Dennard NipRenee Bucker is a 25 y.o. female resents for evaluation of cough and chest congestion and head congestion and back pain for 3 days. Patient complains of a sore throat secondary to coughing so much. Reports fever on and off. Has been taking Tylenol at home.   Past Medical History  Diagnosis Date  . Asthma   . Endometriosis   . Migraine     There are no active problems to display for this patient.   Past Surgical History  Procedure Laterality Date  . Spinal fusion    . Tonsillectomy      Current Outpatient Rx  Name  Route  Sig  Dispense  Refill  . albuterol (PROVENTIL HFA;VENTOLIN HFA) 108 (90 BASE) MCG/ACT inhaler   Inhalation   Inhale 2 puffs into the lungs every 6 (six) hours as needed for wheezing or shortness of breath.   1 Inhaler   2   . azithromycin (ZITHROMAX Z-PAK) 250 MG tablet      Take 2 tablets (500 mg) on  Day 1,  followed by 1 tablet (250 mg) once daily on Days 2 through 5.   6 each   0   . clonazePAM (KLONOPIN) 0.5 MG tablet   Oral   Take 1 tablet (0.5 mg total) by mouth 3 (three) times daily as needed for anxiety.   15 tablet   0   . guaiFENesin-codeine 100-10 MG/5ML syrup   Oral   Take 10 mLs by mouth every 4 (four) hours as needed for cough.   180 mL   0   . ibuprofen (ADVIL,MOTRIN) 800 MG tablet   Oral   Take 1 tablet (800 mg total) by mouth every 8 (eight) hours as needed.   30 tablet   0   . pseudoephedrine (SUDAFED) 60 MG tablet   Oral   Take 1 tablet (60 mg total) by mouth every 4 (four) hours as needed for congestion.   24 tablet   0     Allergies Other  No family history on file.  Social History Social History  Substance Use Topics  . Smoking status:  Current Every Day Smoker  . Smokeless tobacco: None  . Alcohol Use: Yes    Review of Systems Constitutional: No fever/chills Eyes: No visual changes. ENT: Positive sore throat. Cardiovascular: Denies chest pain. Respiratory: Denies shortness of breath. Positive for cough. Gastrointestinal: No abdominal pain.  No nausea, no vomiting.  No diarrhea.  No constipation. Genitourinary: Negative for dysuria. Musculoskeletal: Negative for back pain. Skin: Negative for rash. Neurological: Negative for headaches, focal weakness or numbness.  10-point ROS otherwise negative.  ____________________________________________   PHYSICAL EXAM:  VITAL SIGNS: ED Triage Vitals  Enc Vitals Group     BP 03/07/15 1949 125/65 mmHg     Pulse Rate 03/07/15 1949 88     Resp 03/07/15 1949 20     Temp 03/07/15 1949 98.3 F (36.8 C)     Temp Source 03/07/15 1949 Oral     SpO2 03/07/15 1949 96 %     Weight 03/07/15 1949 230 lb (104.327 kg)     Height 03/07/15 1949 5\' 5"  (1.651 m)     Head Cir --      Peak Flow --  Pain Score 03/07/15 1949 7     Pain Loc --      Pain Edu? --      Excl. in GC? --     Constitutional: Alert and oriented. Well appearing and in no acute distress. Eyes: Conjunctivae are normal. PERRL. EOMI. Head: Atraumatic. Nose: No congestion/rhinnorhea. Mouth/Throat: Mucous membranes are moist.  Oropharynx non-erythematous. Neck: No stridor.   Cardiovascular: Normal rate, regular rhythm. Grossly normal heart sounds.  Good peripheral circulation. Respiratory: Normal respiratory effort.  No retractions. Lungs with coarse rhonchi scattered. Musculoskeletal: No lower extremity tenderness nor edema.  No joint effusions. Neurologic:  Normal speech and language. No gross focal neurologic deficits are appreciated. No gait instability. Skin:  Skin is warm, dry and intact. No rash noted. Psychiatric: Mood and affect are normal. Speech and behavior are  normal.  ____________________________________________   LABS (all labs ordered are listed, but only abnormal results are displayed)  Labs Reviewed - No data to display ____________________________________________  RADIOLOGY  Deferred at this visit. ____________________________________________   PROCEDURES  Procedure(s) performed: None  Critical Care performed: No  ____________________________________________   INITIAL IMPRESSION / ASSESSMENT AND PLAN / ED COURSE  Pertinent labs & imaging results that were available during my care of the patient were reviewed by me and considered in my medical decision making (see chart for details).  Acute upper respiratory infection. Rx given for Zithromax, Sudafed, Robitussin-AC, and Motrin 800 mg 3 times a day. Patient follow-up PCP or return here with any worsening symptomology. Work excuse 1 day given. ____________________________________________   FINAL CLINICAL IMPRESSION(S) / ED DIAGNOSES  Final diagnoses:  URI, acute      Evangeline Dakin, PA-C 03/07/15 2046  Phineas Semen, MD 03/07/15 2324

## 2015-03-07 NOTE — ED Notes (Signed)
Patient presents with cough (productive) and cold symptoms since Sunday. Patient denies fever. Patient advises that she has aggravated a previous back injury with all of the coughing that she has been doing.

## 2015-03-07 NOTE — Discharge Instructions (Signed)
Upper Respiratory Infection, Adult Most upper respiratory infections (URIs) are a viral infection of the air passages leading to the lungs. A URI affects the nose, throat, and upper air passages. The most common type of URI is nasopharyngitis and is typically referred to as "the common cold." URIs run their course and usually go away on their own. Most of the time, a URI does not require medical attention, but sometimes a bacterial infection in the upper airways can follow a viral infection. This is called a secondary infection. Sinus and middle ear infections are common types of secondary upper respiratory infections. Bacterial pneumonia can also complicate a URI. A URI can worsen asthma and chronic obstructive pulmonary disease (COPD). Sometimes, these complications can require emergency medical care and may be life threatening.  CAUSES Almost all URIs are caused by viruses. A virus is a type of germ and can spread from one person to another.  RISKS FACTORS You may be at risk for a URI if:   You smoke.   You have chronic heart or lung disease.  You have a weakened defense (immune) system.   You are very young or very old.   You have nasal allergies or asthma.  You work in crowded or poorly ventilated areas.  You work in health care facilities or schools. SIGNS AND SYMPTOMS  Symptoms typically develop 2-3 days after you come in contact with a cold virus. Most viral URIs last 7-10 days. However, viral URIs from the influenza virus (flu virus) can last 14-18 days and are typically more severe. Symptoms may include:   Runny or stuffy (congested) nose.   Sneezing.   Cough.   Sore throat.   Headache.   Fatigue.   Fever.   Loss of appetite.   Pain in your forehead, behind your eyes, and over your cheekbones (sinus pain).  Muscle aches.  DIAGNOSIS  Your health care provider may diagnose a URI by:  Physical exam.  Tests to check that your symptoms are not due to  another condition such as:  Strep throat.  Sinusitis.  Pneumonia.  Asthma. TREATMENT  A URI goes away on its own with time. It cannot be cured with medicines, but medicines may be prescribed or recommended to relieve symptoms. Medicines may help:  Reduce your fever.  Reduce your cough.  Relieve nasal congestion. HOME CARE INSTRUCTIONS   Take medicines only as directed by your health care provider.   Gargle warm saltwater or take cough drops to comfort your throat as directed by your health care provider.  Use a warm mist humidifier or inhale steam from a shower to increase air moisture. This may make it easier to breathe.  Drink enough fluid to keep your urine clear or pale yellow.   Eat soups and other clear broths and maintain good nutrition.   Rest as needed.   Return to work when your temperature has returned to normal or as your health care provider advises. You may need to stay home longer to avoid infecting others. You can also use a face mask and careful hand washing to prevent spread of the virus.  Increase the usage of your inhaler if you have asthma.   Do not use any tobacco products, including cigarettes, chewing tobacco, or electronic cigarettes. If you need help quitting, ask your health care provider. PREVENTION  The best way to protect yourself from getting a cold is to practice good hygiene.   Avoid oral or hand contact with people with cold   symptoms.   Wash your hands often if contact occurs.  There is no clear evidence that vitamin C, vitamin E, echinacea, or exercise reduces the chance of developing a cold. However, it is always recommended to get plenty of rest, exercise, and practice good nutrition.  SEEK MEDICAL CARE IF:   You are getting worse rather than better.   Your symptoms are not controlled by medicine.   You have chills.  You have worsening shortness of breath.  You have brown or red mucus.  You have yellow or brown nasal  discharge.  You have pain in your face, especially when you bend forward.  You have a fever.  You have swollen neck glands.  You have pain while swallowing.  You have white areas in the back of your throat. SEEK IMMEDIATE MEDICAL CARE IF:   You have severe or persistent:  Headache.  Ear pain.  Sinus pain.  Chest pain.  You have chronic lung disease and any of the following:  Wheezing.  Prolonged cough.  Coughing up blood.  A change in your usual mucus.  You have a stiff neck.  You have changes in your:  Vision.  Hearing.  Thinking.  Mood. MAKE SURE YOU:   Understand these instructions.  Will watch your condition.  Will get help right away if you are not doing well or get worse.   This information is not intended to replace advice given to you by your health care provider. Make sure you discuss any questions you have with your health care provider.   Document Released: 08/13/2000 Document Revised: 07/04/2014 Document Reviewed: 05/25/2013 Elsevier Interactive Patient Education 2016 Elsevier Inc.  

## 2015-03-08 ENCOUNTER — Encounter: Payer: Self-pay | Admitting: Emergency Medicine

## 2015-03-08 ENCOUNTER — Emergency Department
Admission: EM | Admit: 2015-03-08 | Discharge: 2015-03-08 | Disposition: A | Discharge status: Home / Self Care | Payer: 59 | Attending: Emergency Medicine | Admitting: Emergency Medicine

## 2015-03-08 DIAGNOSIS — J45909 Unspecified asthma, uncomplicated: Secondary | ICD-10-CM | POA: Insufficient documentation

## 2015-03-08 DIAGNOSIS — F17210 Nicotine dependence, cigarettes, uncomplicated: Secondary | ICD-10-CM | POA: Insufficient documentation

## 2015-03-08 DIAGNOSIS — M545 Low back pain: Secondary | ICD-10-CM | POA: Diagnosis present

## 2015-03-08 DIAGNOSIS — Z792 Long term (current) use of antibiotics: Secondary | ICD-10-CM | POA: Insufficient documentation

## 2015-03-08 DIAGNOSIS — M5440 Lumbago with sciatica, unspecified side: Secondary | ICD-10-CM | POA: Diagnosis not present

## 2015-03-08 DIAGNOSIS — Z3202 Encounter for pregnancy test, result negative: Secondary | ICD-10-CM | POA: Diagnosis not present

## 2015-03-08 LAB — POCT PREGNANCY, URINE: PREG TEST UR: NEGATIVE

## 2015-03-08 MED ORDER — KETOROLAC TROMETHAMINE 60 MG/2ML IM SOLN
60.0000 mg | Freq: Once | INTRAMUSCULAR | Status: AC
  Administered 2015-03-08: 60 mg via INTRAMUSCULAR
  Filled 2015-03-08: qty 2

## 2015-03-08 NOTE — Discharge Instructions (Signed)
Go to the pharmacy and pick up your prescriptions that were written for you yesterday. Moist heat or ice to back as needed for comfort. Follow-up with your doctor in [place] if any continued problems.

## 2015-03-08 NOTE — ED Notes (Signed)
States she was seen last pm for URI sx's   Unable to get rx's filled b/c drug store were closed.. Then had a hard cough and felt pain to lower back and into both legs  Ambulates well.. Denies any recent fall or urinary sx's

## 2015-03-08 NOTE — ED Notes (Signed)
Pt states was seen here last night and dx with an URI. Pt states that she has been unable to fill her prescription for cough medicine and abx. Pt states since she was seen the coughing has not gotten any better. Pt states pain to her lower back that goes to R leg. Pt presents as tearful to triage. Pt states previous back injury. NAD noted at this time.

## 2015-03-08 NOTE — ED Provider Notes (Signed)
[facility] Emergency Department Provider Note  ____________________________________________  Time seen: Approximately 7:32 AM  I have reviewed the triage vital signs and the nursing notes.   HISTORY  Chief Complaint Back Pain and Leg Pain  HPI Courtney Sanchez is a 25 y.o. female seen here yesterday for upper respiratory symptoms, cough and pain to back and bilateral legs. Today she returns because she did not get her prescriptions filled and her cough is worse. She states her back pain radiates into her right leg. She denies any injury to her back and states is "cause she's been coughing".Currently she rates her pain a 10 out of 10. She states pain is worse with walking and standing. Nothing has helped her pain. Patient yesterday was discharged on Zithromax, guaifenesin with codeine cough syrup, ibuprofen 800 mg, and was told to get pseudoephedrine 60 mg tablets. Patient states they left the emergency room at 10:00 and went home.   Past Medical History  Diagnosis Date  . Asthma   . Endometriosis   . Migraine     There are no active problems to display for this patient.   Past Surgical History  Procedure Laterality Date  . Spinal fusion    . Tonsillectomy      Current Outpatient Rx  Name  Route  Sig  Dispense  Refill  . albuterol (PROVENTIL HFA;VENTOLIN HFA) 108 (90 BASE) MCG/ACT inhaler   Inhalation   Inhale 2 puffs into the lungs every 6 (six) hours as needed for wheezing or shortness of breath.   1 Inhaler   2   . azithromycin (ZITHROMAX Z-PAK) 250 MG tablet      Take 2 tablets (500 mg) on  Day 1,  followed by 1 tablet (250 mg) once daily on Days 2 through 5.   6 each   0   . clonazePAM (KLONOPIN) 0.5 MG tablet   Oral   Take 1 tablet (0.5 mg total) by mouth 3 (three) times daily as needed for anxiety.   15 tablet   0   . guaiFENesin-codeine 100-10 MG/5ML syrup   Oral   Take 10 mLs by mouth every 4 (four) hours as needed for  cough.   180 mL   0   . ibuprofen (ADVIL,MOTRIN) 800 MG tablet   Oral   Take 1 tablet (800 mg total) by mouth every 8 (eight) hours as needed.   30 tablet   0   . pseudoephedrine (SUDAFED) 60 MG tablet   Oral   Take 1 tablet (60 mg total) by mouth every 4 (four) hours as needed for congestion.   24 tablet   0     Allergies Other  History reviewed. No pertinent family history.  Social History Social History  Substance Use Topics  . Smoking status: Current Every Day Smoker -- 0.50 packs/day    Types: Cigarettes  . Smokeless tobacco: Never Used  . Alcohol Use: No    Review of Systems Constitutional: No fever/chills ENT: No sore throat. Cardiovascular: Denies chest pain. Respiratory: Denies shortness of breath. Positive cough. Positive congestion. Gastrointestinal: No abdominal pain.  No nausea, no vomiting.   Genitourinary: Negative for dysuria. Musculoskeletal: Positive for back pain. Skin: Negative for rash. Neurological: Negative for headaches, focal weakness or numbness.  10-point ROS otherwise negative.  ____________________________________________   PHYSICAL EXAM:  VITAL SIGNS: ED Triage Vitals  Enc Vitals Group     BP 03/08/15 0709 132/75 mmHg     Pulse Rate 03/08/15 0709  105     Resp 03/08/15 0709 20     Temp 03/08/15 0709 97.9 F (36.6 C)     Temp Source 03/08/15 0709 Oral     SpO2 03/08/15 0709 96 %     Weight 03/08/15 0709 230 lb (104.327 kg)     Height 03/08/15 0709 5\' 5"  (1.651 m)     Head Cir --      Peak Flow --      Pain Score 03/08/15 0710 10     Pain Loc --      Pain Edu? --      Excl. in GC? --     Constitutional: Alert and oriented. Well appearing and in no acute distress. Eyes: Conjunctivae are normal. PERRL. EOMI. Head: Atraumatic. Nose: Mild congestion/rhinnorhea. Mouth/Throat: Mucous membranes are moist.  Oropharynx non-erythematous. Neck: No stridor.  Supple. Hematological/Lymphatic/Immunilogical: No cervical  lymphadenopathy. Cardiovascular: Normal rate, regular rhythm. Grossly normal heart sounds.  Good peripheral circulation. Respiratory: Normal respiratory effort.  No retractions. Lungs CTAB. Coarse cough. Gastrointestinal: Soft and nontender. No distention.  Musculoskeletal: No lower extremity tenderness nor edema.  No joint effusions. Neurologic:  Normal speech and language. No gross focal neurologic deficits are appreciated. No gait instability. Skin:  Skin is warm, dry and intact. No rash noted. Psychiatric: Mood and affect are normal. Speech and behavior are normal.  ____________________________________________   LABS (all labs ordered are listed, but only abnormal results are displayed)  Labs Reviewed  POC URINE PREG, ED  POCT PREGNANCY, URINE      PROCEDURES  Procedure(s) performed: None  Critical Care performed: No  ____________________________________________   INITIAL IMPRESSION / ASSESSMENT AND PLAN / ED COURSE  Pertinent labs & imaging results that were available during my care of the patient were reviewed by me and considered in my medical decision making (see chart for details).  Pregnancy test was noted to be negative patient was given Toradol 60 mg IM in the emergency room and told to go to the pharmacy to pick up her prescriptions. He is to follow-up with her PCP in [place] if any continued problems. ____________________________________________   FINAL CLINICAL IMPRESSION(S) / ED DIAGNOSES  Final diagnoses:  Midline low back pain with sciatica, sciatica laterality unspecified      Tommi Rumps, PA-C 03/08/15 9147  Governor Rooks, MD 03/08/15 [phone]

## 2015-03-27 DIAGNOSIS — M5432 Sciatica, left side: Secondary | ICD-10-CM | POA: Insufficient documentation

## 2015-03-27 DIAGNOSIS — M5442 Lumbago with sciatica, left side: Secondary | ICD-10-CM | POA: Insufficient documentation

## 2015-04-27 ENCOUNTER — Emergency Department: Payer: 59

## 2015-04-27 ENCOUNTER — Encounter: Payer: Self-pay | Admitting: *Deleted

## 2015-04-27 ENCOUNTER — Emergency Department
Admission: EM | Admit: 2015-04-27 | Discharge: 2015-04-27 | Disposition: A | Discharge status: Home / Self Care | Payer: 59 | Attending: Student | Admitting: Student

## 2015-04-27 DIAGNOSIS — M25461 Effusion, right knee: Secondary | ICD-10-CM | POA: Diagnosis not present

## 2015-04-27 DIAGNOSIS — Y998 Other external cause status: Secondary | ICD-10-CM | POA: Insufficient documentation

## 2015-04-27 DIAGNOSIS — Y9289 Other specified places as the place of occurrence of the external cause: Secondary | ICD-10-CM | POA: Diagnosis not present

## 2015-04-27 DIAGNOSIS — S8991XA Unspecified injury of right lower leg, initial encounter: Secondary | ICD-10-CM

## 2015-04-27 DIAGNOSIS — Z792 Long term (current) use of antibiotics: Secondary | ICD-10-CM | POA: Diagnosis not present

## 2015-04-27 DIAGNOSIS — S30810A Abrasion of lower back and pelvis, initial encounter: Secondary | ICD-10-CM | POA: Insufficient documentation

## 2015-04-27 DIAGNOSIS — Y9389 Activity, other specified: Secondary | ICD-10-CM | POA: Insufficient documentation

## 2015-04-27 DIAGNOSIS — F17210 Nicotine dependence, cigarettes, uncomplicated: Secondary | ICD-10-CM | POA: Diagnosis not present

## 2015-04-27 DIAGNOSIS — S299XXA Unspecified injury of thorax, initial encounter: Secondary | ICD-10-CM | POA: Diagnosis not present

## 2015-04-27 MED ORDER — MELOXICAM 15 MG PO TABS: 15.0000 mg | ORAL_TABLET | Freq: Every day | ORAL | Status: DC

## 2015-04-27 MED ORDER — CLONAZEPAM 1 MG PO TABS: 1.0000 mg | ORAL_TABLET | Freq: Three times a day (TID) | ORAL | Status: DC | PRN

## 2015-04-27 MED ORDER — HYDROCODONE-ACETAMINOPHEN 5-325 MG PO TABS: 1.0000 | ORAL_TABLET | ORAL | Status: DC | PRN
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
## 2015-04-27 NOTE — Discharge Instructions (Signed)
Knee Effusion °Knee effusion means that you have extra fluid in your knee. This can cause pain. Your knee may be more difficult to bend and move. °HOME CARE °· Use crutches as told by your doctor. °· Wear a knee brace as told by your doctor. °· Apply ice to the swollen area: °¨ Put ice in a plastic bag. °¨ Place a towel between your skin and the bag. °¨ Leave the ice on for 20 minutes, 2-3 times per day. °· Keep your knee raised (elevated) when you are sitting or lying down. °· Take medicines only as told by your doctor. °· Do any rehabilitation or strengthening exercises as told by your doctor. °· Rest your knee as told by your doctor. You may start doing your normal activities again when your doctor says it is okay. °· Keep all follow-up visits as told by your doctor. This is important. °GET HELP IF:  °· You continue to have pain in your knee. °GET HELP RIGHT AWAY IF: °· You have increased swelling or redness of your knee. °· You have severe pain in your knee. °· You have a fever. °  °This information is not intended to replace advice given to you by your health care provider. Make sure you discuss any questions you have with your health care provider. °  °Document Released: 03/22/2010 Document Revised: 03/10/2014 Document Reviewed: 10/03/2013 °Elsevier Interactive Patient Education ©2016 Elsevier Inc. ° °
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
## 2015-05-23 ENCOUNTER — Encounter: Payer: Self-pay | Admitting: Emergency Medicine

## 2015-05-23 ENCOUNTER — Emergency Department
Admission: EM | Admit: 2015-05-23 | Discharge: 2015-05-23 | Disposition: A | Discharge status: Home / Self Care | Payer: 59 | Attending: Emergency Medicine | Admitting: Emergency Medicine

## 2015-05-23 DIAGNOSIS — Z79899 Other long term (current) drug therapy: Secondary | ICD-10-CM | POA: Insufficient documentation

## 2015-05-23 DIAGNOSIS — Z5321 Procedure and treatment not carried out due to patient leaving prior to being seen by health care provider: Secondary | ICD-10-CM | POA: Diagnosis not present

## 2015-05-23 DIAGNOSIS — R51 Headache: Secondary | ICD-10-CM | POA: Diagnosis present

## 2015-05-23 DIAGNOSIS — Z791 Long term (current) use of non-steroidal anti-inflammatories (NSAID): Secondary | ICD-10-CM | POA: Diagnosis not present

## 2015-05-23 NOTE — ED Notes (Signed)
Pt. Called to take back to room. No answer
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
## 2015-05-28 DIAGNOSIS — Z5321 Procedure and treatment not carried out due to patient leaving prior to being seen by health care provider: Secondary | ICD-10-CM | POA: Insufficient documentation

## 2015-05-28 DIAGNOSIS — Z79899 Other long term (current) drug therapy: Secondary | ICD-10-CM | POA: Diagnosis not present

## 2015-05-28 DIAGNOSIS — F17210 Nicotine dependence, cigarettes, uncomplicated: Secondary | ICD-10-CM | POA: Insufficient documentation

## 2015-05-28 DIAGNOSIS — Z041 Encounter for examination and observation following transport accident: Secondary | ICD-10-CM | POA: Diagnosis present

## 2015-05-28 DIAGNOSIS — J45909 Unspecified asthma, uncomplicated: Secondary | ICD-10-CM | POA: Insufficient documentation

## 2015-05-28 MED ORDER — ACETAMINOPHEN 325 MG PO TABS
ORAL_TABLET | ORAL | Status: AC
  Filled 2015-05-28: qty 2

## 2015-05-28 MED ORDER — ACETAMINOPHEN 325 MG PO TABS
650.0000 mg | ORAL_TABLET | Freq: Once | ORAL | Status: AC
  Administered 2015-05-28: 650 mg via ORAL

## 2015-05-28 NOTE — ED Notes (Signed)
[facility] Was told she had a concussion and that if she started having vomiting to return to the ER and be evaluated. Patient states she started to vomit about 45 mins ago. States two episodes of vomiting in 45 mins. States she also has a headache. Denies other symptoms of pain states she "just doesn't feel good.

## 2015-05-29 ENCOUNTER — Emergency Department
Admission: EM | Admit: 2015-05-29 | Discharge: 2015-05-29 | Disposition: A | Discharge status: Home / Self Care | Payer: 59 | Attending: Emergency Medicine | Admitting: Emergency Medicine

## 2016-01-19 ENCOUNTER — Ambulatory Visit
Admission: EM | Admit: 2016-01-19 | Discharge: 2016-01-19 | Disposition: A | Discharge status: Home / Self Care | Payer: 59 | Attending: Family Medicine | Admitting: Family Medicine

## 2016-01-19 DIAGNOSIS — J45901 Unspecified asthma with (acute) exacerbation: Secondary | ICD-10-CM

## 2016-01-19 DIAGNOSIS — Z720 Tobacco use: Secondary | ICD-10-CM

## 2016-01-19 DIAGNOSIS — J209 Acute bronchitis, unspecified: Secondary | ICD-10-CM

## 2016-01-19 DIAGNOSIS — J069 Acute upper respiratory infection, unspecified: Secondary | ICD-10-CM

## 2016-01-19 MED ORDER — PREDNISONE 10 MG (21) PO TBPK: ORAL_TABLET | ORAL | 0 refills | Status: DC

## 2016-01-19 MED ORDER — IPRATROPIUM-ALBUTEROL 0.5-2.5 (3) MG/3ML IN SOLN
3.0000 mL | Freq: Once | RESPIRATORY_TRACT | Status: AC
Start: 2016-01-19 — End: 2016-01-19
  Administered 2016-01-19: 3 mL via RESPIRATORY_TRACT

## 2016-01-19 MED ORDER — AZITHROMYCIN 500 MG PO TABS: ORAL_TABLET | ORAL | 0 refills | Status: DC

## 2016-01-19 MED ORDER — HYDROCOD POLST-CPM POLST ER 10-8 MG/5ML PO SUER: 5.0000 mL | Freq: Two times a day (BID) | ORAL | 0 refills | Status: DC | PRN

## 2016-01-19 MED ORDER — METHYLPREDNISOLONE SODIUM SUCC 125 MG IJ SOLR
125.0000 mg | Freq: Once | INTRAMUSCULAR | Status: AC
  Administered 2016-01-19: 125 mg via INTRAMUSCULAR

## 2016-01-19 NOTE — ED Provider Notes (Addendum)
[facility]    CSN: [id number] Arrival date & time: 01/19/16  0807     History   Chief Complaint Chief Complaint  Patient presents with  . Cough    HPI Courtney Sanchez is a 25 y.o. female.   Patient is a 25 year old white female set history of asthma who still smokes states he was first sick about 12 days ago. Last Monday she saw her PCP Hooper on Atrovent nasal spray 6 day course of prednisone. She was also placed on Tessalon Perles for the cough. She states that the prolapse didn't do much for the coughing the prednisone and Atrovent did seem to help initially with the bronchospasms but the last few days has come back with a vengeance. She states she's never was completely cured the coughing has gotten worse now that her ribs hurt and she is coughing yellowish-green phlegm as well. She denies any fever but does report sweating at night and unable to sleep at night because of the coughing. She does have a history of asthma. She smokes about pack a day and states that it was smoked 2-3 cigarettes a day now. Stress to her the importance of not smoking at all. She's had a spinal fusion and tonsillectomy in the past. Medical problems include asthma migraines and endometriosis. Father has had kidney cancer she's had proper kidney removed as well.   The history is provided by the patient. No language interpreter was used.  Cough  Cough characteristics:  Productive Sputum characteristics:  Green and yellow Severity:  Severe Duration:  12 days Progression:  Worsening Chronicity:  New Smoker: yes   Context: smoke exposure, upper respiratory infection and with activity   Relieved by:  Nothing Ineffective treatments:  Ipratropium inhaler, cough suppressants and beta-agonist inhaler Associated symptoms: chest pain and shortness of breath   Associated symptoms: no sinus congestion     Past Medical History:  Diagnosis Date  . Asthma   . Endometriosis   . Migraine      There are no active problems to display for this patient.   Past Surgical History:  Procedure Laterality Date  . SPINAL FUSION    . TONSILLECTOMY      OB History    No data available       Home Medications    Prior to Admission medications   Medication Sig Start Date End Date Taking? Authorizing Provider  ipratropium (ATROVENT) 0.06 % nasal spray Place 2 sprays into both nostrils 4 (four) times daily.   Yes Historical Provider, MD  predniSONE (DELTASONE) 20 MG tablet Take 20 mg by mouth daily with breakfast.   Yes Historical Provider, MD  albuterol (PROVENTIL HFA;VENTOLIN HFA) 108 (90 BASE) MCG/ACT inhaler Inhale 2 puffs into the lungs every 6 (six) hours as needed for wheezing or shortness of breath. 01/12/15   Darci Current, MD  azithromycin (ZITHROMAX Z-PAK) 250 MG tablet Take 2 tablets (500 mg) on  Day 1,  followed by 1 tablet (250 mg) once daily on Days 2 through 5. 03/07/15   Evangeline Dakin, PA-C  clonazePAM (KLONOPIN) 1 MG tablet Take 1 tablet (1 mg total) by mouth 3 (three) times daily as needed for anxiety. 04/27/15 04/26/16  Chinita Pester, FNP  guaiFENesin-codeine 100-10 MG/5ML syrup Take 10 mLs by mouth every 4 (four) hours as needed for cough. 03/07/15   Evangeline Dakin, PA-C  HYDROcodone-acetaminophen (NORCO/VICODIN) 5-325 MG tablet Take 1 tablet by mouth every 4 (four) hours as needed  for moderate pain. 04/27/15   Chinita Pesterari B Triplett, FNP  ibuprofen (ADVIL,MOTRIN) 800 MG tablet Take 1 tablet (800 mg total) by mouth every 8 (eight) hours as needed. 03/07/15   Evangeline Dakinharles M Beers, PA-C  meloxicam (MOBIC) 15 MG tablet Take 1 tablet (15 mg total) by mouth daily. 04/27/15   Chinita Pesterari B Triplett, FNP  pseudoephedrine (SUDAFED) 60 MG tablet Take 1 tablet (60 mg total) by mouth every 4 (four) hours as needed for congestion. 03/07/15   Evangeline Dakinharles M Beers, PA-C    Family History History reviewed. No pertinent family history.  Social History Social History  Substance Use Topics  . Smoking  status: Current Every Day Smoker    Packs/day: 1.00    Types: Cigarettes  . Smokeless tobacco: Never Used  . Alcohol use No     Allergies   Other   Review of Systems Review of Systems  Respiratory: Positive for cough and shortness of breath.   Cardiovascular: Positive for chest pain.  All other systems reviewed and are negative.    Physical Exam Triage Vital Signs ED Triage Vitals  Enc Vitals Group     BP 01/19/16 0828 135/73     Pulse Rate 01/19/16 0828 98     Resp 01/19/16 0828 20     Temp 01/19/16 0828 98.2 F (36.8 C)     Temp Source 01/19/16 0828 Oral     SpO2 01/19/16 0828 100 %     Weight 01/19/16 0829 230 lb (104.3 kg)     Height 01/19/16 0829 5\' 5"  (1.651 m)     Head Circumference --      Peak Flow --      Pain Score 01/19/16 0833 4     Pain Loc --      Pain Edu? --      Excl. in GC? --    No data found.   Updated Vital Signs BP 135/73 (BP Location: Right Arm)   Pulse 98   Temp 98.2 F (36.8 C) (Oral)   Resp 20   Ht 5\' 5"  (1.651 m)   Wt 230 lb (104.3 kg)   LMP 12/25/2015   SpO2 100%   BMI 38.27 kg/m   Visual Acuity Right Eye Distance:   Left Eye Distance:   Bilateral Distance:    Right Eye Near:   Left Eye Near:    Bilateral Near:     Physical Exam  Constitutional: She is oriented to person, place, and time. She appears well-developed and well-nourished.  HENT:  Head: Normocephalic and atraumatic.  Right Ear: Hearing, tympanic membrane, external ear and ear canal normal.  Left Ear: Hearing, tympanic membrane, external ear and ear canal normal.  Mouth/Throat: Uvula is midline. Posterior oropharyngeal erythema present.  Eyes: EOM are normal. Pupils are equal, round, and reactive to light.  Neck: Normal range of motion. Neck supple.  Cardiovascular: Normal rate and regular rhythm.   Pulmonary/Chest: Breath sounds normal. No respiratory distress.  Actively coughing  Musculoskeletal: Normal range of motion.  Lymphadenopathy:    She  has cervical adenopathy.  Neurological: She is alert and oriented to person, place, and time.  Skin: Skin is warm and dry. No rash noted.  Psychiatric: She has a normal mood and affect.  Vitals reviewed.    UC Treatments / Results  Labs (all labs ordered are listed, but only abnormal results are displayed) Labs Reviewed - No data to display  EKG  EKG Interpretation None  Radiology No results found.  Procedures Procedures (including critical care time)  Medications Ordered in UC Medications  methylPREDNISolone sodium succinate (SOLU-MEDROL) 125 mg/2 mL injection 125 mg (125 mg Intramuscular Given 01/19/16 0939)  ipratropium-albuterol (DUONEB) 0.5-2.5 (3) MG/3ML nebulizer solution 3 mL (3 mLs Nebulization Given 01/19/16 0935)     Initial Impression / Assessment and Plan / UC Course  I have reviewed the triage vital signs and the nursing notes.  Pertinent labs & imaging results that were available during my care of the patient were reviewed by me and considered in my medical decision making (see chart for details).  Clinical Course     Discussed by getting a chest x-ray and will hold off chest x-ray at this time but she's not improved she'll need to get chest x-ray. Will give her 125 mg Solu-Medrol IM in the DuoNeb breathing treatment at this time. Will give a work note for today and tomorrow. Place on Tussionex 1 teaspoon twice a day Zithromax 500 mg daily for 5 days and will going to place her on another 6 day course of prednisone. Warned that she keeps smoking she is oriented to have trouble.  Final Clinical Impressions(s) / UC Diagnoses   Final diagnoses:  Acute bronchitis with bronchospasm  Upper respiratory tract infection, unspecified type  Exacerbation of asthma, unspecified asthma severity, unspecified whether persistent  Tobacco abuse    New Prescriptions New Prescriptions   No medications on file     Hassan RowanEugene Calieb Lichtman, MD 01/19/16 [id number]    Hassan RowanEugene  Prarthana Parlin, MD 01/19/16 [phone]

## 2016-01-19 NOTE — ED Triage Notes (Signed)
Pt with cough and congestion x 2 weeks. Pt has been taking medication from PCP and was starting to improve but then returned. Pt reports her Dr. Was most concerned about her smoking. Taking Prednisone, Atrovent nasal spray and Tessalon.

## 2016-01-31 IMAGING — CR DG CHEST 2V
1 series · 3 of 3 positions shown · non-contrast
Comparison: 01/17/2012

CLINICAL DATA: Cough and chills

EXAM:
CHEST  2 VIEW

[Series 1: dg chest 2 view · 0.14mm/px · 3 of 3 slices shown]
[im 1/3]
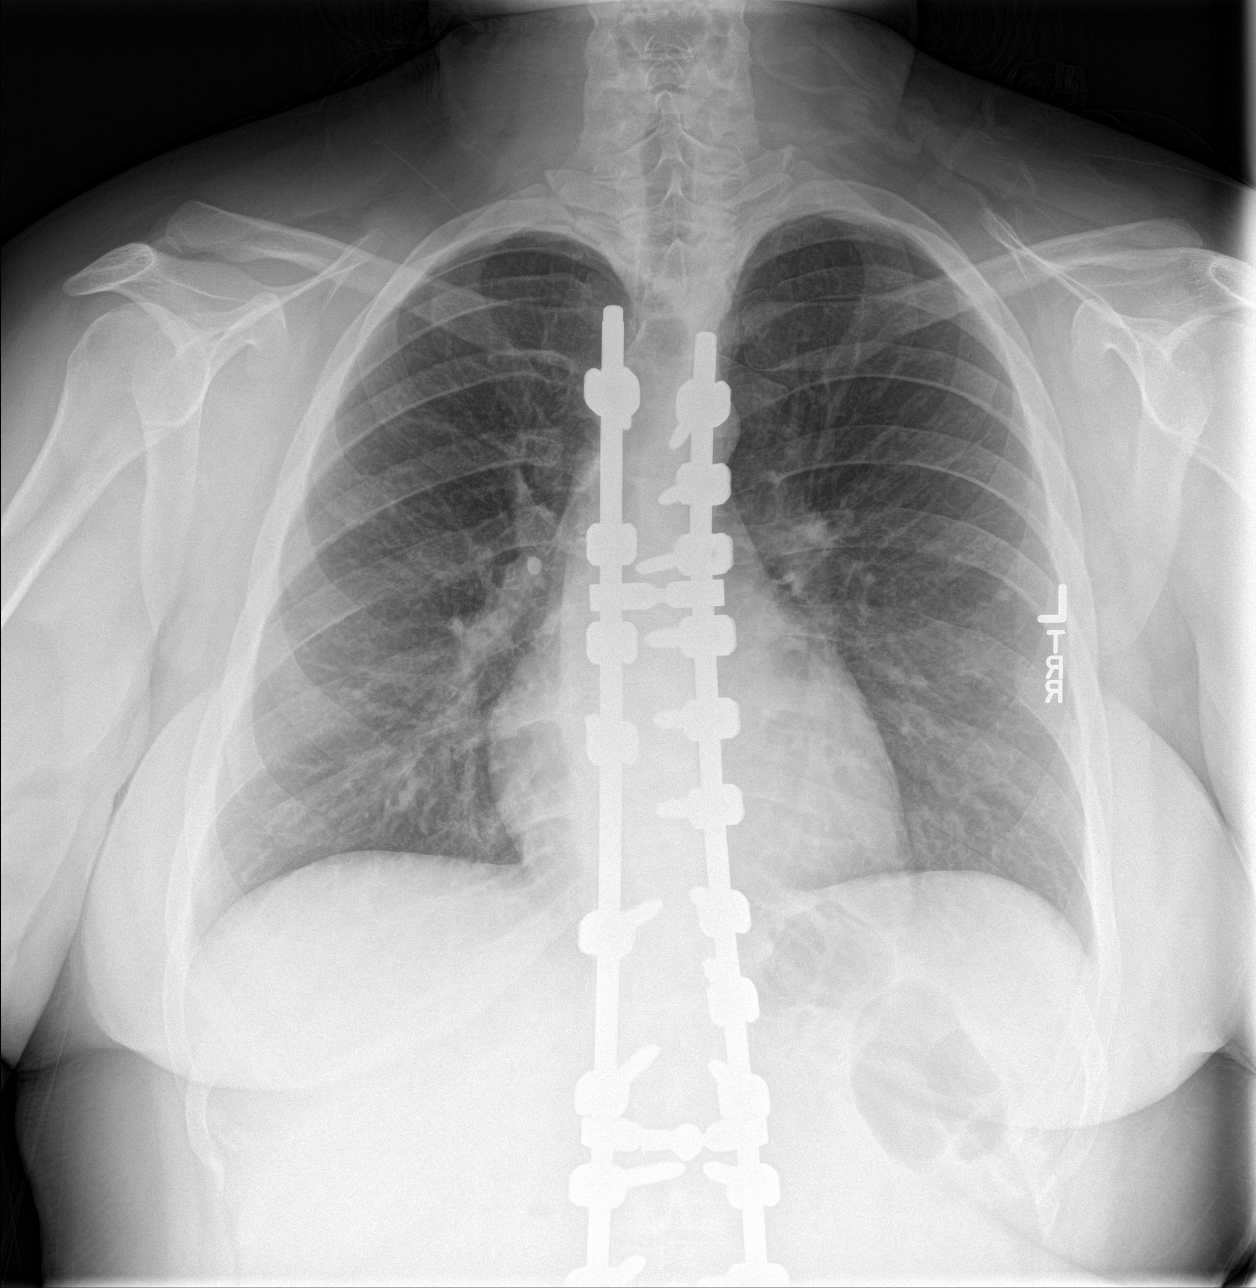
[im 2/3]
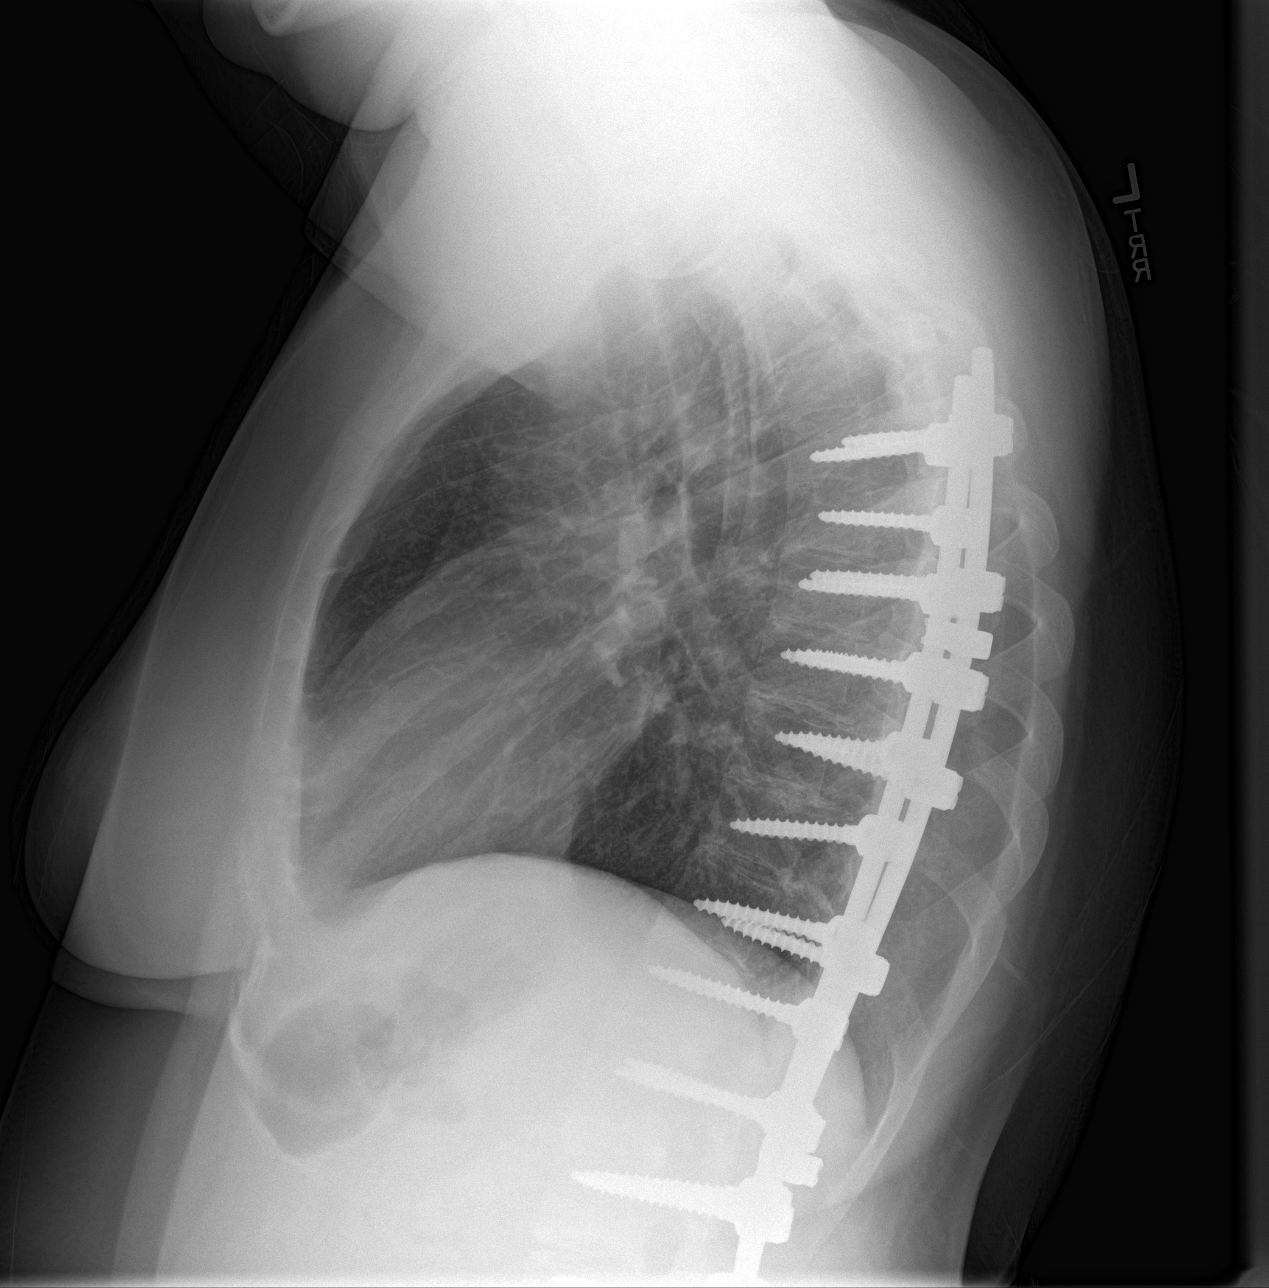
[im 3/3]
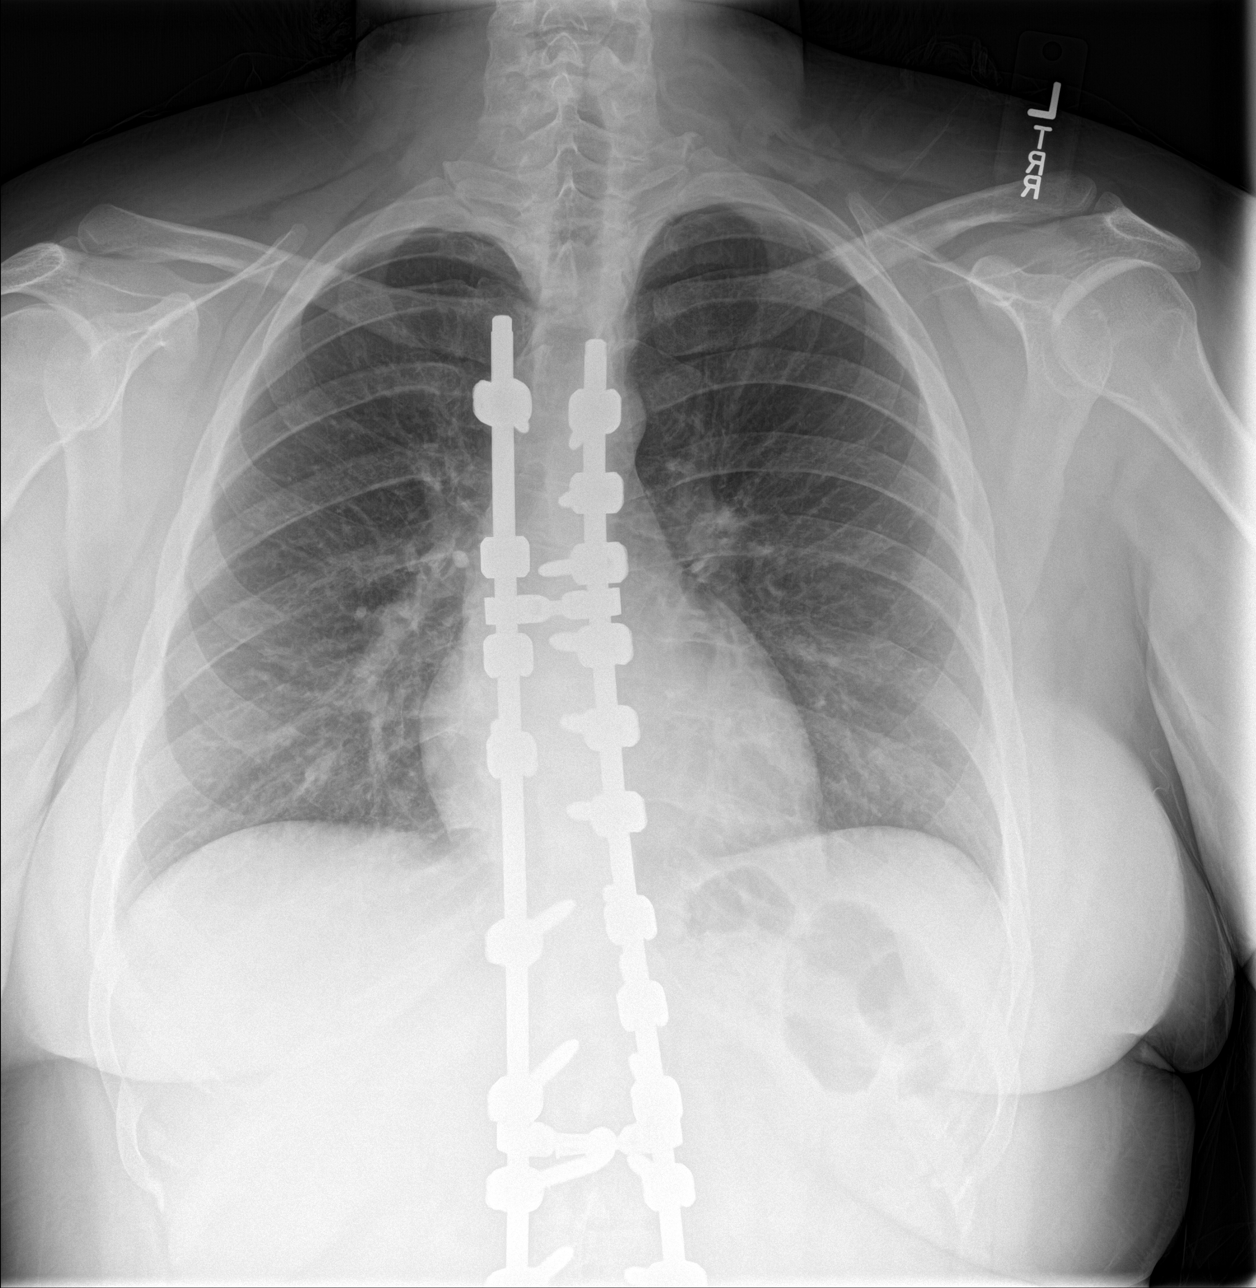

[3 of 3 positions shown; findings below may reference images not displayed]

FINDINGS: Normal heart size and mediastinal contours. No acute infiltrate or
edema. No effusion or pneumothorax. Scoliosis with post
thoracolumbar fixation. No acute osseous findings.
IMPRESSION: No acute finding.

## 2016-02-26 ENCOUNTER — Encounter: Payer: Self-pay | Admitting: Emergency Medicine

## 2016-02-26 ENCOUNTER — Emergency Department
Admission: EM | Admit: 2016-02-26 | Discharge: 2016-02-26 | Disposition: A | Discharge status: Home / Self Care | Payer: 59 | Attending: Emergency Medicine | Admitting: Emergency Medicine

## 2016-02-26 ENCOUNTER — Emergency Department: Payer: 59

## 2016-02-26 DIAGNOSIS — O26891 Other specified pregnancy related conditions, first trimester: Secondary | ICD-10-CM | POA: Diagnosis not present

## 2016-02-26 DIAGNOSIS — R109 Unspecified abdominal pain: Secondary | ICD-10-CM

## 2016-02-26 DIAGNOSIS — J45909 Unspecified asthma, uncomplicated: Secondary | ICD-10-CM | POA: Diagnosis not present

## 2016-02-26 DIAGNOSIS — F17210 Nicotine dependence, cigarettes, uncomplicated: Secondary | ICD-10-CM | POA: Insufficient documentation

## 2016-02-26 DIAGNOSIS — R102 Pelvic and perineal pain: Secondary | ICD-10-CM | POA: Insufficient documentation

## 2016-02-26 DIAGNOSIS — Z3A01 Less than 8 weeks gestation of pregnancy: Secondary | ICD-10-CM | POA: Insufficient documentation

## 2016-02-26 DIAGNOSIS — Z79899 Other long term (current) drug therapy: Secondary | ICD-10-CM | POA: Insufficient documentation

## 2016-02-26 DIAGNOSIS — O26899 Other specified pregnancy related conditions, unspecified trimester: Secondary | ICD-10-CM

## 2016-02-26 DIAGNOSIS — O99331 Smoking (tobacco) complicating pregnancy, first trimester: Secondary | ICD-10-CM | POA: Diagnosis not present

## 2016-02-26 LAB — CBC WITH DIFFERENTIAL/PLATELET
BASOS ABS: 0.1 10*3/uL (ref 0–0.1)
BASOS PCT: 1 %
EOS ABS: 0.3 10*3/uL (ref 0–0.7)
Eosinophils Relative: 2 %
HCT: 42.6 % (ref 35.0–47.0)
HEMOGLOBIN: 14.1 g/dL (ref 12.0–16.0)
Lymphocytes Relative: 18 %
Lymphs Abs: 2.1 10*3/uL (ref 1.0–3.6)
MCH: 29.9 pg (ref 26.0–34.0)
MCHC: 33.1 g/dL (ref 32.0–36.0)
MCV: 90.2 fL (ref 80.0–100.0)
Monocytes Absolute: 0.7 10*3/uL (ref 0.2–0.9)
Monocytes Relative: 6 %
NEUTROS ABS: 8.7 10*3/uL — AB (ref 1.4–6.5)
Neutrophils Relative %: 73 %
Platelets: 278 10*3/uL (ref 150–440)
RBC: 4.73 MIL/uL (ref 3.80–5.20)
RDW: 13.7 % (ref 11.5–14.5)
WBC: 11.9 10*3/uL — AB (ref 3.6–11.0)

## 2016-02-26 LAB — URINALYSIS, COMPLETE (UACMP) WITH MICROSCOPIC
BACTERIA UA: NONE SEEN
BILIRUBIN URINE: NEGATIVE
Glucose, UA: NEGATIVE mg/dL
HGB URINE DIPSTICK: NEGATIVE
KETONES UR: NEGATIVE mg/dL
LEUKOCYTES UA: NEGATIVE
NITRITE: NEGATIVE
Protein, ur: NEGATIVE mg/dL
Specific Gravity, Urine: 1.013 (ref 1.005–1.030)
pH: 7 (ref 5.0–8.0)

## 2016-02-26 LAB — COMPREHENSIVE METABOLIC PANEL
ALBUMIN: 3.9 g/dL (ref 3.5–5.0)
ALK PHOS: 68 U/L (ref 38–126)
ALT: 16 U/L (ref 14–54)
ANION GAP: 6 (ref 5–15)
AST: 18 U/L (ref 15–41)
BUN: 11 mg/dL (ref 6–20)
CALCIUM: 9.1 mg/dL (ref 8.9–10.3)
CO2: 25 mmol/L (ref 22–32)
Chloride: 104 mmol/L (ref 101–111)
Creatinine, Ser: 0.89 mg/dL (ref 0.44–1.00)
GFR calc Af Amer: >60 mL/min (ref >60)
GFR calc non Af Amer: >60 mL/min (ref >60)
GLUCOSE: 76 mg/dL (ref 65–99)
Potassium: 4 mmol/L (ref 3.5–5.1)
SODIUM: 135 mmol/L (ref 135–145)
Total Bilirubin: 0.8 mg/dL (ref 0.3–1.2)
Total Protein: 6.9 g/dL (ref 6.5–8.1)

## 2016-02-26 LAB — HCG, QUANTITATIVE, PREGNANCY: hCG, Beta Chain, Quant, S: 2907 m[IU]/mL — ABNORMAL HIGH (ref <5)

## 2016-02-26 MED ORDER — ACETAMINOPHEN 500 MG PO TABS
1000.0000 mg | ORAL_TABLET | Freq: Once | ORAL | Status: AC
  Administered 2016-02-26: 1000 mg via ORAL
  Filled 2016-02-26: qty 2

## 2016-02-26 MED ORDER — ONDANSETRON 4 MG PO TBDP: 4.0000 mg | ORAL_TABLET | Freq: Three times a day (TID) | ORAL | 0 refills | Status: DC | PRN

## 2016-02-26 NOTE — ED Notes (Signed)
[facility] sent  

## 2016-02-26 NOTE — ED Provider Notes (Signed)
[facility] Emergency Department Provider Note        Time seen: ----------------------------------------- 7:39 PM on 02/26/2016 -----------------------------------------    I have reviewed the triage vital signs and the nursing notes.   HISTORY  Chief Complaint Abdominal Cramping    HPI Leyda Dennard NipRenee Haag is a 25 y.o. female who presents to the ER thinking she is around 4-[redacted] weeks pregnant. She reports right-sided groin and pelvic pain for the last 3 days. Patient states pain comes and goes and moves around. She denies any vaginal bleeding but does have a history of endometriosis. She denies any urinary symptoms.   Past Medical History:  Diagnosis Date  . Asthma   . Endometriosis   . Migraine     There are no active problems to display for this patient.   Past Surgical History:  Procedure Laterality Date  . SPINAL FUSION    . TONSILLECTOMY      Allergies Other  Social History Social History  Substance Use Topics  . Smoking status: Current Some Day Smoker    Packs/day: 1.00    Types: Cigarettes  . Smokeless tobacco: Never Used  . Alcohol use No    Review of Systems Constitutional: Negative for fever. Cardiovascular: Negative for chest pain. Respiratory: Negative for shortness of breath. Gastrointestinal: Positive for abdominal pain, vomiting and diarrhea. Genitourinary: Positive for pelvic pain, negative for bleeding Musculoskeletal: Negative for back pain. Skin: Negative for rash. Neurological: Negative for headaches, focal weakness or numbness.  10-point ROS otherwise negative.  ____________________________________________   PHYSICAL EXAM:  VITAL SIGNS: ED Triage Vitals  Enc Vitals Group     BP 02/26/16 1805 126/79     Pulse Rate 02/26/16 1805 100     Resp 02/26/16 1805 16     Temp 02/26/16 1805 98 F (36.7 C)     Temp Source 02/26/16 1805 Oral     SpO2 02/26/16 1805 100 %     Weight 02/26/16 1806 230 lb (104.3  kg)     Height 02/26/16 1806 5\' 5"  (1.651 m)     Head Circumference --      Peak Flow --      Pain Score 02/26/16 1806 5     Pain Loc --      Pain Edu? --      Excl. in GC? --     Constitutional: Alert and oriented. Well appearing and in no distress. Eyes: Conjunctivae are normal. PERRL. Normal extraocular movements. ENT   Head: Normocephalic and atraumatic.   Nose: No congestion/rhinnorhea.   Mouth/Throat: Mucous membranes are moist.   Neck: No stridor. Cardiovascular: Normal rate, regular rhythm. No murmurs, rubs, or gallops. Respiratory: Normal respiratory effort without tachypnea nor retractions. Breath sounds are clear and equal bilaterally. No wheezes/rales/rhonchi. Gastrointestinal: Soft and nontender. Normal bowel sounds Musculoskeletal: Nontender with normal range of motion in all extremities. No lower extremity tenderness nor edema. Neurologic:  Normal speech and language. No gross focal neurologic deficits are appreciated.  Skin:  Skin is warm, dry and intact. No rash noted. Psychiatric: Mood and affect are normal. Speech and behavior are normal.  ____________________________________________  ED COURSE:  Pertinent labs & imaging results that were available during my care of the patient were reviewed by me and considered in my medical decision making (see chart for details). Clinical Course   Patient presents to the ER in no distress with abdominal pain and pregnancy. We will assess with labs and ultrasound.  Procedures ____________________________________________  LABS (pertinent positives/negatives)  Labs Reviewed  HCG, QUANTITATIVE, PREGNANCY - Abnormal; Notable for the following:       Result Value   hCG, Beta Chain, Quant, S 2,907 (*)    All other components within normal limits  URINALYSIS, COMPLETE (UACMP) WITH MICROSCOPIC - Abnormal; Notable for the following:    Color, Urine YELLOW (*)    APPearance CLEAR (*)    Squamous Epithelial / LPF  0-5 (*)    All other components within normal limits  CBC WITH DIFFERENTIAL/PLATELET - Abnormal; Notable for the following:    WBC 11.9 (*)    Neutro Abs 8.7 (*)    All other components within normal limits  COMPREHENSIVE METABOLIC PANEL    RADIOLOGY  Pregnancy ultrasound IMPRESSION: 1. Early intrauterine gestational sac with a yolk sac; no fetal pole, or cardiac activity yet visualized. No subchorionic hemorrhage. Recommend follow-up quantitative B-HCG levels and follow-up US in 10 days to assess viability. This recommendation follows SRU consensus guidelines: Diagnostic Criteria for Nonviable Pregnancy Early in the First Trimester. Malva Limes Engl J Med 2013; 161:0960-45; 369:1443-51. 2. Normal appearance of both ovaries without torsion. Normal blood flow. ____________________________________________  FINAL ASSESSMENT AND PLAN  Abdominal pain and pregnancy  Plan: Patient with labs and imaging as dictated above. No clear etiology for the patient's symptoms. This is likely normal early pregnancy. We will advise follow-up in 10 days for repeat ultrasound. She is not bleeding at this time. She is stable for outpatient follow-up.   Emily FilbertWilliams, Mala Gibbard E, MD   Note: This dictation was prepared with Dragon dictation. Any transcriptional errors that result from this process are unintentional    Emily FilbertJonathan E Shatasha Lambing, MD 02/26/16 2126

## 2016-02-26 NOTE — ED Triage Notes (Addendum)
Pt reports 10113w6d pregant, reports right groin/pelvic pain and RUQ pain x3 days. Pt denies any vaginal bleeding, reports hx of endometriosis. Pt denies any urinary symptoms.

## 2016-02-26 NOTE — ED Notes (Signed)

## 2016-03-03 NOTE — L&D Delivery Note (Signed)
Dr. Jean RosenthalJackson given report on patient order given for SVE and call back

## 2016-03-03 NOTE — L&D Delivery Note (Signed)
Dr. Jean RosenthalJackson present to evaluate pt and discuss plan of care at this time

## 2016-03-03 NOTE — L&D Delivery Note (Signed)
Dr. Jean RosenthalJackson notified of SVE states he will be around to see pt to cycle blood pressures

## 2016-03-03 NOTE — L&D Delivery Note (Signed)
Report given to oncoming shift.

## 2016-03-13 ENCOUNTER — Emergency Department
Admission: EM | Admit: 2016-03-13 | Discharge: 2016-03-13 | Disposition: A | Discharge status: Home / Self Care | Payer: 59 | Attending: Emergency Medicine | Admitting: Emergency Medicine

## 2016-03-13 ENCOUNTER — Encounter: Payer: Self-pay | Admitting: Emergency Medicine

## 2016-03-13 DIAGNOSIS — O99331 Smoking (tobacco) complicating pregnancy, first trimester: Secondary | ICD-10-CM | POA: Insufficient documentation

## 2016-03-13 DIAGNOSIS — Z3A01 Less than 8 weeks gestation of pregnancy: Secondary | ICD-10-CM | POA: Insufficient documentation

## 2016-03-13 DIAGNOSIS — R1030 Lower abdominal pain, unspecified: Secondary | ICD-10-CM | POA: Insufficient documentation

## 2016-03-13 DIAGNOSIS — Z79899 Other long term (current) drug therapy: Secondary | ICD-10-CM | POA: Insufficient documentation

## 2016-03-13 DIAGNOSIS — F17210 Nicotine dependence, cigarettes, uncomplicated: Secondary | ICD-10-CM | POA: Insufficient documentation

## 2016-03-13 DIAGNOSIS — J45909 Unspecified asthma, uncomplicated: Secondary | ICD-10-CM | POA: Insufficient documentation

## 2016-03-13 DIAGNOSIS — R109 Unspecified abdominal pain: Secondary | ICD-10-CM

## 2016-03-13 DIAGNOSIS — O26891 Other specified pregnancy related conditions, first trimester: Secondary | ICD-10-CM | POA: Diagnosis present

## 2016-03-13 DIAGNOSIS — R102 Pelvic and perineal pain: Secondary | ICD-10-CM | POA: Insufficient documentation

## 2016-03-13 LAB — BASIC METABOLIC PANEL
Anion gap: 7 (ref 5–15)
BUN: 11 mg/dL (ref 6–20)
CALCIUM: 9.3 mg/dL (ref 8.9–10.3)
CO2: 24 mmol/L (ref 22–32)
CREATININE: 0.76 mg/dL (ref 0.44–1.00)
Chloride: 107 mmol/L (ref 101–111)
GFR calc Af Amer: >60 mL/min (ref >60)
Glucose, Bld: 90 mg/dL (ref 65–99)
Potassium: 3.7 mmol/L (ref 3.5–5.1)
SODIUM: 138 mmol/L (ref 135–145)

## 2016-03-13 LAB — CBC WITH DIFFERENTIAL/PLATELET
Basophils Absolute: 0.1 10*3/uL (ref 0–0.1)
Basophils Relative: 1 %
EOS ABS: 0.3 10*3/uL (ref 0–0.7)
EOS PCT: 3 %
HCT: 40.9 % (ref 35.0–47.0)
Hemoglobin: 13.9 g/dL (ref 12.0–16.0)
LYMPHS ABS: 1.8 10*3/uL (ref 1.0–3.6)
Lymphocytes Relative: 20 %
MCH: 30.5 pg (ref 26.0–34.0)
MCHC: 33.9 g/dL (ref 32.0–36.0)
MCV: 89.9 fL (ref 80.0–100.0)
MONOS PCT: 7 %
Monocytes Absolute: 0.6 10*3/uL (ref 0.2–0.9)
Neutro Abs: 6.3 10*3/uL (ref 1.4–6.5)
Neutrophils Relative %: 69 %
PLATELETS: 253 10*3/uL (ref 150–440)
RBC: 4.55 MIL/uL (ref 3.80–5.20)
RDW: 13.4 % (ref 11.5–14.5)
WBC: 9.1 10*3/uL (ref 3.6–11.0)

## 2016-03-13 LAB — ABO/RH: ABO/RH(D): O POS

## 2016-03-13 LAB — HCG, QUANTITATIVE, PREGNANCY: hCG, Beta Chain, Quant, S: 54137 m[IU]/mL — ABNORMAL HIGH (ref <5)

## 2016-03-13 NOTE — ED Provider Notes (Signed)
[facility] Emergency Department Provider Note  Time seen: 9:42 PM  I have reviewed the triage vital signs and the nursing notes.   HISTORY  Chief Complaint Abdominal Cramping and Hypotension    HPI Courtney Sanchez is a 26 y.o. female with a past medical history of asthma, endometriosis, migraines who presents to the emergency department G1 P0 at [redacted] weeks pregnant. According to the patient she has been experiencing cramping for the past 2-3 weeks. Patient was seen in the emergency department 02/26/16 and diagnosed with a likely early intrauterine pregnancy. Patient is going to see [facility] for her OB care. Patient states she went last week and had a repeat ultrasound performed but had not heard the results of that ultrasound. Patient states continued cramping today and took her blood pressure which was in the 90s so she came to the emergency department for evaluation. Currently patient's blood pressure is normal 126/65. Patient has no complaints besides mild abdominal cramping and nausea which she states it been ongoing fairly constantly for the past 2-3 weeks. Denies any vaginal bleeding or discharge.  Past Medical History:  Diagnosis Date  . Asthma   . Endometriosis   . Migraine     There are no active problems to display for this patient.   Past Surgical History:  Procedure Laterality Date  . SPINAL FUSION    . TONSILLECTOMY      Prior to Admission medications   Medication Sig Start Date End Date Taking? Authorizing Provider  albuterol (PROVENTIL HFA;VENTOLIN HFA) 108 (90 BASE) MCG/ACT inhaler Inhale 2 puffs into the lungs every 6 (six) hours as needed for wheezing or shortness of breath. 01/12/15   Darci Current, MD  azithromycin (ZITHROMAX Z-PAK) 250 MG tablet Take 2 tablets (500 mg) on  Day 1,  followed by 1 tablet (250 mg) once daily on Days 2 through 5. 03/07/15   Evangeline Dakin, PA-C  azithromycin (ZITHROMAX) 500 MG tablet 1 tablet  daily 01/19/16   Hassan Rowan, MD  chlorpheniramine-HYDROcodone [facility] PENNKINETIC ER) 10-8 MG/5ML SUER Take 5 mLs by mouth every 12 (twelve) hours as needed for cough. 01/19/16   Hassan Rowan, MD  clonazePAM (KLONOPIN) 1 MG tablet Take 1 tablet (1 mg total) by mouth 3 (three) times daily as needed for anxiety. 04/27/15 04/26/16  Chinita Pester, FNP  guaiFENesin-codeine 100-10 MG/5ML syrup Take 10 mLs by mouth every 4 (four) hours as needed for cough. 03/07/15   Evangeline Dakin, PA-C  HYDROcodone-acetaminophen (NORCO/VICODIN) 5-325 MG tablet Take 1 tablet by mouth every 4 (four) hours as needed for moderate pain. 04/27/15   Chinita Pester, FNP  ibuprofen (ADVIL,MOTRIN) 800 MG tablet Take 1 tablet (800 mg total) by mouth every 8 (eight) hours as needed. 03/07/15   Charmayne Sheer Beers, PA-C  ipratropium (ATROVENT) 0.06 % nasal spray Place 2 sprays into both nostrils 4 (four) times daily.    Historical Provider, MD  meloxicam (MOBIC) 15 MG tablet Take 1 tablet (15 mg total) by mouth daily. 04/27/15   Chinita Pester, FNP  ondansetron (ZOFRAN ODT) 4 MG disintegrating tablet Take 1 tablet (4 mg total) by mouth every 8 (eight) hours as needed for nausea or vomiting. 02/26/16   Emily Filbert, MD  predniSONE (DELTASONE) 20 MG tablet Take 20 mg by mouth daily with breakfast.    Historical Provider, MD  predniSONE (STERAPRED UNI-PAK 21 TAB) 10 MG (21) TBPK tablet Sig 6 tablet day 1, 5 tablets day  2, 4 tablets day 3,,3tablets day 4, 2 tablets day 5, 1 tablet day 6 take all tablets orally 01/19/16   Hassan RowanEugene Wade, MD  pseudoephedrine (SUDAFED) 60 MG tablet Take 1 tablet (60 mg total) by mouth every 4 (four) hours as needed for congestion. 03/07/15   Evangeline Dakinharles M Beers, PA-C    Allergies  Allergen Reactions  . Other     "Pretty much every antibiotic there is"     No family history on file.  Social History Social History  Substance Use Topics  . Smoking status: Current Some Day Smoker    Packs/day: 1.00     Types: Cigarettes  . Smokeless tobacco: Never Used  . Alcohol use No    Review of Systems Constitutional: Negative for fever. Cardiovascular: Negative for chest pain. Respiratory: Negative for shortness of breath. Gastrointestinal: Positive for lower abdominal cramping, states a history of endometriosis. Denies any vomiting or diarrhea. Does state nausea for the past 2-3 weeks. Genitourinary: Negative for dysuria. Denies vaginal bleeding or discharge. Neurological: Negative for headache 10-point ROS otherwise negative.  ____________________________________________   PHYSICAL EXAM:  VITAL SIGNS: ED Triage Vitals  Enc Vitals Group     BP 03/13/16 1929 126/65     Pulse Rate 03/13/16 1929 89     Resp --      Temp 03/13/16 1929 98.2 F (36.8 C)     Temp Source 03/13/16 1929 Oral     SpO2 03/13/16 1929 99 %     Weight 03/13/16 1930 230 lb (104.3 kg)     Height 03/13/16 1930 5\' 5"  (1.651 m)     Head Circumference --      Peak Flow --      Pain Score 03/13/16 1930 7     Pain Loc --      Pain Edu? --      Excl. in GC? --     Constitutional: Alert and oriented. Well appearing and in no distress. Eyes: Normal exam ENT   Head: Normocephalic and atraumatic.   Mouth/Throat: Mucous membranes are moist. Cardiovascular: Normal rate, regular rhythm. No murmu Respiratory: Normal respiratory effort without tachypnea nor retractions. Breath sounds are clear Gastrointestinal: Soft, mild lower abdominal tenderness palpation without rebound or guarding. No distention. Musculoskeletal: Nontender with normal range of motion in all extremities. Neurologic:  Normal speech and language. No gross focal neurologic deficits are appreciated. Skin:  Skin is warm, dry and intact.  Psychiatric: Mood and affect are normal.   ____________________________________________   INITIAL IMPRESSION / ASSESSMENT AND PLAN / ED COURSE  Pertinent labs & imaging results that were available during my care  of the patient were reviewed by me and considered in my medical decision making (see chart for details).  The patient presents to the emergency department approximately [redacted] weeks pregnant. Patient states abdominal cramping but states this is been present for 2-3 weeks. States lower blood pressure today in the 90s currently normotensive. Patient's labs are within normal limits with a reassuring beta-hCG. Largely nontender abdomen with minimal lower abdominal tenderness, again the patient states largely unchanged and has a history of endometriosis and states she is used to having abdominal pain. Patient states she had an ultrasound performed last week. I am able to see the ultrasound from 02/26/16 which appears most consistent with an early intrauterine pregnancy. We'll discuss with [facility] to hopefully obtain results from the ultrasound she had last week. Overall the patient appears very well, no distress. Anticipate discharge home with OB follow-up.  Discussed the patient with the on-call OB who recommends office follow-up. Overall the patient appears well, no distress. Discussed return precautions for any bleeding or significant abdominal pain.  ____________________________________________   FINAL CLINICAL IMPRESSION(S) / ED DIAGNOSES  Abdominal cramping during pregnancy    Minna Antis, MD 03/13/16 2220

## 2016-03-13 NOTE — ED Notes (Addendum)
Pt sts that abd cramping has continued last 2 weeks w/o change.  Denies any worsening in cramping, denies vaginal bleeding or discharge.  Pt sts that she stopped taking HTN medications 2 months ago, has not been monitoring BP since.

## 2016-03-13 NOTE — Discharge Instructions (Signed)
Please call your OB to arrange follow-up. Her workup today including blood work is normal. Return to the emergency department for any increased abdominal pain, vaginal bleeding, or any other symptom personally concerning to yourself.

## 2016-03-13 NOTE — ED Triage Notes (Signed)
Patient to ER for c/o pelvic cramping and low BP. [facility]. Patient states she was seen here a few weeks ago when she was approx [redacted] weeks pregnant for cramping as well. Cramping has continued through pregnancy (now [redacted] weeks pregnant). States BP earlier today was 94/60. Denies any vaginal bleeding.

## 2016-03-17 LAB — OB RESULTS CONSOLE HGB/HCT, BLOOD
HEMATOCRIT: 42 %
HEMOGLOBIN: 14.4 g/dL

## 2016-03-17 LAB — OB RESULTS CONSOLE PLATELET COUNT: PLATELETS: 301 10*3/uL

## 2016-03-17 LAB — OB RESULTS CONSOLE RUBELLA ANTIBODY, IGM: Rubella: IMMUNE

## 2016-03-17 LAB — OB RESULTS CONSOLE RPR: RPR: NONREACTIVE

## 2016-03-17 LAB — OB RESULTS CONSOLE HIV ANTIBODY (ROUTINE TESTING): HIV: NONREACTIVE

## 2016-03-17 LAB — OB RESULTS CONSOLE GC/CHLAMYDIA
Chlamydia: NEGATIVE
GC PROBE AMP, GENITAL: NEGATIVE

## 2016-03-17 LAB — OB RESULTS CONSOLE VARICELLA ZOSTER ANTIBODY, IGG: Varicella: IMMUNE

## 2016-03-17 LAB — OB RESULTS CONSOLE ANTIBODY SCREEN: ANTIBODY SCREEN: NEGATIVE

## 2016-03-17 LAB — OB RESULTS CONSOLE ABO/RH: RH Type: POSITIVE

## 2016-03-17 LAB — OB RESULTS CONSOLE HEPATITIS B SURFACE ANTIGEN: Hepatitis B Surface Ag: NEGATIVE

## 2016-03-21 ENCOUNTER — Emergency Department
Admission: EM | Admit: 2016-03-21 | Discharge: 2016-03-22 | Disposition: A | Discharge status: Home / Self Care | Payer: 59 | Attending: Emergency Medicine | Admitting: Emergency Medicine

## 2016-03-21 DIAGNOSIS — J45909 Unspecified asthma, uncomplicated: Secondary | ICD-10-CM | POA: Diagnosis not present

## 2016-03-21 DIAGNOSIS — F17210 Nicotine dependence, cigarettes, uncomplicated: Secondary | ICD-10-CM | POA: Diagnosis not present

## 2016-03-21 DIAGNOSIS — O208 Other hemorrhage in early pregnancy: Secondary | ICD-10-CM | POA: Diagnosis not present

## 2016-03-21 DIAGNOSIS — Z79899 Other long term (current) drug therapy: Secondary | ICD-10-CM | POA: Insufficient documentation

## 2016-03-21 DIAGNOSIS — Z3A08 8 weeks gestation of pregnancy: Secondary | ICD-10-CM | POA: Diagnosis not present

## 2016-03-21 DIAGNOSIS — N898 Other specified noninflammatory disorders of vagina: Secondary | ICD-10-CM | POA: Diagnosis not present

## 2016-03-21 DIAGNOSIS — O4690 Antepartum hemorrhage, unspecified, unspecified trimester: Secondary | ICD-10-CM

## 2016-03-21 DIAGNOSIS — O418X10 Other specified disorders of amniotic fluid and membranes, first trimester, not applicable or unspecified: Secondary | ICD-10-CM

## 2016-03-21 DIAGNOSIS — Z3491 Encounter for supervision of normal pregnancy, unspecified, first trimester: Secondary | ICD-10-CM

## 2016-03-21 DIAGNOSIS — O468X1 Other antepartum hemorrhage, first trimester: Secondary | ICD-10-CM

## 2016-03-21 LAB — URINALYSIS, ROUTINE W REFLEX MICROSCOPIC
BILIRUBIN URINE: NEGATIVE
Glucose, UA: NEGATIVE mg/dL
Hgb urine dipstick: NEGATIVE
KETONES UR: NEGATIVE mg/dL
Leukocytes, UA: NEGATIVE
NITRITE: NEGATIVE
PH: 6 (ref 5.0–8.0)
PROTEIN: NEGATIVE mg/dL
Specific Gravity, Urine: 1.003 — ABNORMAL LOW (ref 1.005–1.030)

## 2016-03-21 LAB — HCG, QUANTITATIVE, PREGNANCY: HCG, BETA CHAIN, QUANT, S: 77330 m[IU]/mL — AB (ref <5)

## 2016-03-21 LAB — ABO/RH: ABO/RH(D): O POS

## 2016-03-21 NOTE — ED Notes (Signed)
Lab called regarding ABO/Rh, states pt had lab completed a week ago, verbal order from Dr. Pershing ProudSchaevitz to discontinue, however in Epic, unable to discontinue.  Lab aware to discontinue

## 2016-03-21 NOTE — ED Triage Notes (Signed)
Pt presents to ED with c/o vaginal bleeding that started at 730-8pm tonight. Pt reports being 6543w1d pregnant. Pt reports RLQ abdominal pain, (+) nausea, denies emesis, SHOB or CP. Pt denies needing to use a sanitary pad, reports bleeding was contained with a "quarter sized spot" when she wiped after urinating.

## 2016-03-21 NOTE — ED Provider Notes (Signed)
[facility] Emergency Department Provider Note  ____________________________________________   First MD Initiated Contact with Patient 03/21/16 2255     (approximate)  I have reviewed the triage vital signs and the nursing notes.   HISTORY  Chief Complaint Vaginal Bleeding    HPI Manuella Renatta Shrieves is a 26 y.o. female G1 P0at approximately [redacted] weeks gestation presents for 1 episode of vaginal bleeding.  She has had 3 prior emergency department visits for abdominal cramping since the start of this pregnancy.  This is the first time she reports any vaginal bleeding.  She goes to [facility] had an ultrasound performed about 3 weeks ago which showed a very early gestation.  She presents tonight because when she went to the bathroom to urinate when she wiped she found a "quarter size spot of blood".  She has not had any persistent bleeding is that time and when she went to the bathroom in the emergency department she saw no blood.  She continues to have intermittent mild to moderate abdominal cramping most notable on the right side but that has been constant for the last few weeks.  She has had some nausea but no vomiting.  She denies fever/chills, chest pain, shortness of breath, dysuria.  She describes the episode of vaginal bleeding as mild and only occurring that one time earlier this evening.   Past Medical History:  Diagnosis Date  . Asthma   . Endometriosis   . Migraine     There are no active problems to display for this patient.   Past Surgical History:  Procedure Laterality Date  . SPINAL FUSION    . TONSILLECTOMY      Prior to Admission medications   Medication Sig Start Date End Date Taking? Authorizing Provider  albuterol (PROVENTIL HFA;VENTOLIN HFA) 108 (90 BASE) MCG/ACT inhaler Inhale 2 puffs into the lungs every 6 (six) hours as needed for wheezing or shortness of breath. 01/12/15   Darci Current, MD  azithromycin (ZITHROMAX Z-PAK) 250  MG tablet Take 2 tablets (500 mg) on  Day 1,  followed by 1 tablet (250 mg) once daily on Days 2 through 5. 03/07/15   Evangeline Dakin, PA-C  azithromycin (ZITHROMAX) 500 MG tablet 1 tablet daily 01/19/16   Hassan Rowan, MD  chlorpheniramine-HYDROcodone [facility] PENNKINETIC ER) 10-8 MG/5ML SUER Take 5 mLs by mouth every 12 (twelve) hours as needed for cough. 01/19/16   Hassan Rowan, MD  clonazePAM (KLONOPIN) 1 MG tablet Take 1 tablet (1 mg total) by mouth 3 (three) times daily as needed for anxiety. 04/27/15 04/26/16  Chinita Pester, FNP  guaiFENesin-codeine 100-10 MG/5ML syrup Take 10 mLs by mouth every 4 (four) hours as needed for cough. 03/07/15   Evangeline Dakin, PA-C  HYDROcodone-acetaminophen (NORCO/VICODIN) 5-325 MG tablet Take 1 tablet by mouth every 4 (four) hours as needed for moderate pain. 04/27/15   Chinita Pester, FNP  ibuprofen (ADVIL,MOTRIN) 800 MG tablet Take 1 tablet (800 mg total) by mouth every 8 (eight) hours as needed. 03/07/15   Charmayne Sheer Beers, PA-C  ipratropium (ATROVENT) 0.06 % nasal spray Place 2 sprays into both nostrils 4 (four) times daily.    Historical Provider, MD  meloxicam (MOBIC) 15 MG tablet Take 1 tablet (15 mg total) by mouth daily. 04/27/15   Chinita Pester, FNP  ondansetron (ZOFRAN ODT) 4 MG disintegrating tablet Take 1 tablet (4 mg total) by mouth every 8 (eight) hours as needed for nausea or vomiting.  02/26/16   Emily Filbert, MD  predniSONE (DELTASONE) 20 MG tablet Take 20 mg by mouth daily with breakfast.    Historical Provider, MD  predniSONE (STERAPRED UNI-PAK 21 TAB) 10 MG (21) TBPK tablet Sig 6 tablet day 1, 5 tablets day 2, 4 tablets day 3,,3tablets day 4, 2 tablets day 5, 1 tablet day 6 take all tablets orally 01/19/16   Hassan Rowan, MD  pseudoephedrine (SUDAFED) 60 MG tablet Take 1 tablet (60 mg total) by mouth every 4 (four) hours as needed for congestion. 03/07/15   Evangeline Dakin, PA-C    Allergies Other  No family history on file.  Social  History Social History  Substance Use Topics  . Smoking status: Current Some Day Smoker    Packs/day: 1.00    Types: Cigarettes  . Smokeless tobacco: Never Used  . Alcohol use No    Review of Systems Constitutional: No fever/chills Eyes: No visual changes. ENT: No sore throat. Cardiovascular: Denies chest pain. Respiratory: Denies shortness of breath. Gastrointestinal: Persistent abdominal cramping for several weeks.  Nausea, no vomiting.  No diarrhea.  No constipation. Genitourinary: Quarter-sized spot of vaginal bleeding noted after urinating.  Negative for dysuria. Musculoskeletal: Negative for back pain. Skin: Negative for rash. Neurological: Negative for headaches, focal weakness or numbness.  10-point ROS otherwise negative.  ____________________________________________   PHYSICAL EXAM:  VITAL SIGNS: ED Triage Vitals   Enc Vitals Group     BP 125/67     Pulse Rate 88     Resp 18     Temp 98.9 F (37.2 C)     Temp Source Oral     SpO2 99 %     Weight 240 lb (108.9 kg)     Height 5\' 5"  (1.651 m)     Head Circumference      Peak Flow      Pain Score 5     Pain Loc      Pain Edu?      Excl. in GC?     Constitutional: Alert and oriented. Well appearing and in no acute distress. Eyes: Conjunctivae are normal. PERRL. EOMI. Head: Atraumatic. Nose: No congestion/rhinnorhea. Mouth/Throat: Mucous membranes are moist.  Oropharynx non-erythematous. Neck: No stridor.  No meningeal signs.   Cardiovascular: Normal rate, regular rhythm. Good peripheral circulation. Grossly normal heart sounds. Respiratory: Normal respiratory effort.  No retractions. Lungs CTAB. Gastrointestinal: Soft and nontender. No distention.  GU:  deferred Musculoskeletal: No lower extremity tenderness nor edema. No gross deformities of extremities. Neurologic:  Normal speech and language. No gross focal neurologic deficits are appreciated.  Skin:  Skin is warm, dry and intact. No rash  noted. Psychiatric: Mood and affect are normal. Speech and behavior are normal.  ____________________________________________   LABS (all labs ordered are listed, but only abnormal results are displayed)  Labs Reviewed  HCG, QUANTITATIVE, PREGNANCY - Abnormal; Notable for the following:       Result Value   hCG, Beta Chain, Quant, S 77,330 (*)    All other components within normal limits  URINALYSIS, ROUTINE W REFLEX MICROSCOPIC - Abnormal; Notable for the following:    Color, Urine STRAW (*)    APPearance CLEAR (*)    Specific Gravity, Urine 1.003 (*)    All other components within normal limits  ABO/RH   ____________________________________________  EKG  None - EKG not ordered by ED physician ____________________________________________  RADIOLOGY   US Ob Comp < 14 Wks  Result Date: 03/22/2016 CLINICAL DATA:  Pregnant patient in first-trimester pregnancy with vaginal bleeding. EXAM: OBSTETRIC <14 WK [place] AND TRANSVAGINAL OB US TECHNIQUE: Both transabdominal and transvaginal ultrasound examinations were performed for complete evaluation of the gestation as well as the maternal uterus, adnexal regions, and pelvic cul-de-sac. Transvaginal technique was performed to assess early pregnancy. COMPARISON:  Obstetric ultrasound 02/26/2016 FINDINGS: Intrauterine gestational sac: Single Yolk sac:  Visualized. Embryo:  Visualized. Cardiac Activity: Visualized. Heart Rate: 173  bpm CRL:  19  mm   8 w   3 d                  [place] EDC: 10/29/2016 Subchorionic hemorrhage: Small superior lateral to the gestational sac measuring 0.8 x 1.2 x 2.2 cm. Maternal uterus/adnexae: Both ovaries are visualized and are normal. There is no pelvic free fluid. IMPRESSION: Single live intrauterine pregnancy estimated gestational age [redacted] weeks 3 days for estimated date of delivery 10/29/2016. Small subchorionic hemorrhage. Electronically Signed   By: Rubye Oaks M.D.   On: 03/22/2016 01:57   US Ob  Transvaginal  Result Date: 03/22/2016 CLINICAL DATA:  Pregnant patient in first-trimester pregnancy with vaginal bleeding. EXAM: OBSTETRIC <14 WK [place] AND TRANSVAGINAL OB US TECHNIQUE: Both transabdominal and transvaginal ultrasound examinations were performed for complete evaluation of the gestation as well as the maternal uterus, adnexal regions, and pelvic cul-de-sac. Transvaginal technique was performed to assess early pregnancy. COMPARISON:  Obstetric ultrasound 02/26/2016 FINDINGS: Intrauterine gestational sac: Single Yolk sac:  Visualized. Embryo:  Visualized. Cardiac Activity: Visualized. Heart Rate: 173  bpm CRL:  19  mm   8 w   3 d                  [place] EDC: 10/29/2016 Subchorionic hemorrhage: Small superior lateral to the gestational sac measuring 0.8 x 1.2 x 2.2 cm. Maternal uterus/adnexae: Both ovaries are visualized and are normal. There is no pelvic free fluid. IMPRESSION: Single live intrauterine pregnancy estimated gestational age [redacted] weeks 3 days for estimated date of delivery 10/29/2016. Small subchorionic hemorrhage. Electronically Signed   By: Rubye Oaks M.D.   On: 03/22/2016 01:57    ____________________________________________   PROCEDURES  Procedure(s) performed:   Procedures   Critical Care performed: No ____________________________________________   INITIAL IMPRESSION / ASSESSMENT AND PLAN / ED COURSE  Pertinent labs & imaging results that were available during my care of the patient were reviewed by me and considered in my medical decision making (see chart for details).  Rh+, no indication for RhoGAM.  I explained to the patient that in the absence of any persistent bleeding we would defer the pelvic exam.  We will obtain ultrasounds to evaluate for the possibility of any acute changes since the last ultrasound and I had my usual and customary threatened abortion discussion with the patient.  I will reassess after the ultrasound results are available.   Clinical  Course as of Mar 22 213  Sat Mar 22, 2016  0212 Small subchorionic hemorrhage.  I updated the patient and her father with the information and had my usual customary discussion.  I encouraged outpatient follow-up with [facility].  She understands and agrees with plan.  [CF]    Clinical Course User Index [CF] Loleta Rose, MD    ____________________________________________  FINAL CLINICAL IMPRESSION(S) / ED DIAGNOSES  Final diagnoses:  First trimester pregnancy  Vaginal bleeding during pregnancy, antepartum  Subchorionic hemorrhage of placenta in first trimester, single or unspecified fetus     MEDICATIONS GIVEN DURING THIS  VISIT:  Medications - No data to display   NEW OUTPATIENT MEDICATIONS STARTED DURING THIS VISIT:  New Prescriptions   No medications on file    Modified Medications   No medications on file    Discontinued Medications   No medications on file     Note:  This document was prepared using Dragon voice recognition software and may include unintentional dictation errors.    Loleta Roseory Keonna Raether, MD 03/22/16 [phone]

## 2016-03-22 ENCOUNTER — Emergency Department: Payer: 59

## 2016-03-22 NOTE — ED Notes (Signed)
Pt. Requested ginger ale and graham crackers, dr. Jovita GammaGave ok, pt. Given ginger ale and crackers.

## 2016-03-22 NOTE — Discharge Instructions (Signed)
Please read through the included information about subchorionic hematoma/hemorrhage.  This is likely the reason for the small amount of vaginal bleeding you saw today.  Your ultrasound showed a single live fetus at about 8 weeks and 3 days gestation.  Please follow up with [facility] at your scheduled appointment or call them and explain the situation and asked if they feel he should be seen before the scheduled date.  Return to the emergency department if you develop new or worsening symptoms that concern you.

## 2016-03-22 NOTE — ED Notes (Signed)
Dr. York CeriseForbach at bedside to review results w/ patient

## 2016-04-11 ENCOUNTER — Encounter: Payer: Self-pay | Admitting: Emergency Medicine

## 2016-04-11 ENCOUNTER — Emergency Department
Admission: EM | Admit: 2016-04-11 | Discharge: 2016-04-11 | Disposition: A | Discharge status: Home / Self Care | Payer: 59 | Attending: Emergency Medicine | Admitting: Emergency Medicine

## 2016-04-11 ENCOUNTER — Emergency Department: Payer: 59

## 2016-04-11 DIAGNOSIS — Z79899 Other long term (current) drug therapy: Secondary | ICD-10-CM | POA: Diagnosis not present

## 2016-04-11 DIAGNOSIS — O99331 Smoking (tobacco) complicating pregnancy, first trimester: Secondary | ICD-10-CM | POA: Diagnosis not present

## 2016-04-11 DIAGNOSIS — Z3A11 11 weeks gestation of pregnancy: Secondary | ICD-10-CM | POA: Diagnosis not present

## 2016-04-11 DIAGNOSIS — O209 Hemorrhage in early pregnancy, unspecified: Secondary | ICD-10-CM | POA: Diagnosis present

## 2016-04-11 DIAGNOSIS — F17210 Nicotine dependence, cigarettes, uncomplicated: Secondary | ICD-10-CM | POA: Diagnosis not present

## 2016-04-11 DIAGNOSIS — J45909 Unspecified asthma, uncomplicated: Secondary | ICD-10-CM | POA: Insufficient documentation

## 2016-04-11 DIAGNOSIS — O468X1 Other antepartum hemorrhage, first trimester: Secondary | ICD-10-CM

## 2016-04-11 DIAGNOSIS — O200 Threatened abortion: Secondary | ICD-10-CM

## 2016-04-11 DIAGNOSIS — O4690 Antepartum hemorrhage, unspecified, unspecified trimester: Secondary | ICD-10-CM

## 2016-04-11 DIAGNOSIS — O418X10 Other specified disorders of amniotic fluid and membranes, first trimester, not applicable or unspecified: Secondary | ICD-10-CM

## 2016-04-11 LAB — CBC WITH DIFFERENTIAL/PLATELET
BASOS ABS: 0.1 10*3/uL (ref 0–0.1)
Basophils Relative: 1 %
EOS ABS: 0.2 10*3/uL (ref 0–0.7)
EOS PCT: 2 %
HCT: 38.5 % (ref 35.0–47.0)
Hemoglobin: 13.3 g/dL (ref 12.0–16.0)
LYMPHS PCT: 19 %
Lymphs Abs: 2.2 10*3/uL (ref 1.0–3.6)
MCH: 30.5 pg (ref 26.0–34.0)
MCHC: 34.7 g/dL (ref 32.0–36.0)
MCV: 87.9 fL (ref 80.0–100.0)
Monocytes Absolute: 0.8 10*3/uL (ref 0.2–0.9)
Monocytes Relative: 7 %
Neutro Abs: 8 10*3/uL — ABNORMAL HIGH (ref 1.4–6.5)
Neutrophils Relative %: 71 %
PLATELETS: 244 10*3/uL (ref 150–440)
RBC: 4.38 MIL/uL (ref 3.80–5.20)
RDW: 13.4 % (ref 11.5–14.5)
WBC: 11.3 10*3/uL — AB (ref 3.6–11.0)

## 2016-04-11 LAB — HCG, QUANTITATIVE, PREGNANCY: HCG, BETA CHAIN, QUANT, S: 97294 m[IU]/mL — AB (ref <5)

## 2016-04-11 NOTE — Discharge Instructions (Signed)
1. Pelvic rest this weekend until seen by your doctor - no tampons, douching or sexual intercourse. 2. Drink plenty of fluids daily. 3. Return to the ER for worsening symptoms, soaking more than 1 pad per hour, fainting or other concerns.

## 2016-04-11 NOTE — ED Provider Notes (Signed)
[facility] Emergency Department Provider Note   ____________________________________________   First MD Initiated Contact with Patient 04/11/16 [phone]     (approximate)  I have reviewed the triage vital signs and the nursing notes.   HISTORY  Chief Complaint Vaginal Bleeding    HPI Courtney Sanchez is a 26 y.o. female who presents to the ED from home with a chief complaint of vaginal bleeding. Patient is G1 P0 approximately [redacted] weeks pregnant. She has had several ED as well as OB visits so far during this pregnancy for vaginal bleeding. 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 Symptoms associated with pelvic cramping and nausea.Last sexual intercourse 2 days ago. Denies fever, chills, chest pain, shortness of breath, vomiting, diarrhea, dysuria. Denies recent travel or trauma. Nothing makes her symptoms better or worse.   Past Medical History:  Diagnosis Date  . Asthma   . Endometriosis   . Migraine     There are no active problems to display for this patient.   Past Surgical History:  Procedure Laterality Date  . SPINAL FUSION    . TONSILLECTOMY      Prior to Admission medications   Medication Sig Start Date End Date Taking? Authorizing Provider  Prenatal Vit-Fe Fumarate-FA (MULTIVITAMIN-PRENATAL) 27-0.8 MG TABS tablet Take 1 tablet by mouth daily at 12 noon.   Yes Historical Provider, MD    Allergies Other  History reviewed. No pertinent family history.  Social History Social History  Substance Use Topics  . Smoking status: Current Some Day Smoker    Packs/day: 1.00    Types: Cigarettes  . Smokeless tobacco: Never Used  . Alcohol use No    Review of Systems  Constitutional: No fever/chills. Eyes: No visual changes. ENT: No sore throat. Cardiovascular: Denies chest pain. Respiratory: Denies shortness of breath. Gastrointestinal: Positive for pelvic cramping. No abdominal pain.  No nausea,  no vomiting.  No diarrhea.  No constipation. Genitourinary: Positive for vaginal bleeding. Negative for dysuria. Musculoskeletal: Negative for back pain. Skin: Negative for rash. Neurological: Negative for headaches, focal weakness or numbness.  10-point ROS otherwise negative.  ____________________________________________   PHYSICAL EXAM:  VITAL SIGNS: ED Triage Vitals  Enc Vitals Group     BP 04/11/16 0105 127/74     Pulse Rate 04/11/16 0105 89     Resp 04/11/16 0105 18     Temp 04/11/16 0105 98.9 F (37.2 C)     Temp Source 04/11/16 0105 Oral     SpO2 04/11/16 0514 100 %     Weight 04/11/16 0105 240 lb (108.9 kg)     Height 04/11/16 0105 5\' 5"  (1.651 m)     Head Circumference --      Peak Flow --      Pain Score 04/11/16 0105 6     Pain Loc --      Pain Edu? --      Excl. in GC? --     Constitutional: Alert and oriented. Well appearing and in no acute distress. Eyes: Conjunctivae are normal. PERRL. EOMI. Head: Atraumatic. Nose: No congestion/rhinnorhea. Mouth/Throat: Mucous membranes are moist.  Oropharynx non-erythematous. Neck: No stridor.   Cardiovascular: Normal rate, regular rhythm. Grossly normal heart sounds.  Good peripheral circulation. Respiratory: Normal respiratory effort.  No retractions. Lungs CTAB. Gastrointestinal: Soft and nontender to light and deep palpation. No distention. No abdominal bruits. No CVA tenderness. Musculoskeletal: No lower extremity tenderness nor edema.  No joint effusions. Neurologic:  Normal speech and language. No gross focal neurologic deficits are appreciated. No gait instability. Skin:  Skin is warm, dry and intact. No rash noted. Psychiatric: Mood and affect are normal. Speech and behavior are normal.  ____________________________________________   LABS (all labs ordered are listed, but only abnormal results are displayed)  Labs Reviewed  HCG, QUANTITATIVE, PREGNANCY - Abnormal; Notable for the following:       Result  Value   hCG, Beta Chain, Quant, S 97,294 (*)    All other components within normal limits  CBC WITH DIFFERENTIAL/PLATELET - Abnormal; Notable for the following:    WBC 11.3 (*)    Neutro Abs 8.0 (*)    All other components within normal limits   ____________________________________________  EKG  None ____________________________________________  RADIOLOGY  OB ultrasound interpreted per Dr. Andria Meuse: Single intrauterine pregnancy. Estimated gestational age by  crown-rump length is 11 weeks 1 day, representing appropriate growth  since the previous study. Small subchorionic hemorrhage is  demonstrated, increased in size since prior study.   ____________________________________________   PROCEDURES  Procedure(s) performed:   Pelvic exam: External exam WNL without rashes, lesions or vesicles. Speculum exam reveals no bleeding; cervical os closed. Bimanual exam WNL.  Procedures  Critical Care performed: No  ____________________________________________   INITIAL IMPRESSION / ASSESSMENT AND PLAN / ED COURSE  Pertinent labs & imaging results that were available during my care of the patient were reviewed by me and considered in my medical decision making (see chart for details).  26 year old female G1 P0 at 11 weeks 1 day pregnancy with vaginal bleeding with increasing size of subchorionic hemorrhage. She has a scheduled follow-up visit with her OB/GYN on Monday. Pelvic rest as well as strict return precautions given. Patient verbalizes understanding and agrees with plan of care.      ____________________________________________   FINAL CLINICAL IMPRESSION(S) / ED DIAGNOSES  Final diagnoses:  Threatened miscarriage  Vaginal bleeding in pregnancy  Subchorionic hematoma in first trimester, single or unspecified fetus      NEW MEDICATIONS STARTED DURING THIS VISIT:  New Prescriptions   No medications on file     Note:  This document was prepared using Dragon  voice recognition software and may include unintentional dictation errors.    Irean Hong, MD 04/11/16 [id number]

## 2016-04-11 NOTE — ED Triage Notes (Signed)
Pt ambulatory to triage with steady gait, no distress noted. Pt c/o vaginal bleeding that started at 2330 last night. Pt is [redacted] week gestation, first pregnancy. Pt used 1 sanitary pad since, with "quarter sized" spot.

## 2016-04-11 NOTE — ED Notes (Signed)
Patient c/o vaginal bleeding described as spotting at 2330 on 2/8. Pt reports she is [redacted] weeks pregnant.  Pt c/o LLQ abdominal pain and nausea.

## 2016-04-14 ENCOUNTER — Other Ambulatory Visit: Payer: Self-pay | Admitting: Family Medicine

## 2016-04-14 DIAGNOSIS — Z3682 Encounter for antenatal screening for nuchal translucency: Secondary | ICD-10-CM

## 2016-04-29 ENCOUNTER — Encounter: Payer: Self-pay | Admitting: Obstetrics & Gynecology

## 2016-04-29 ENCOUNTER — Ambulatory Visit: Payer: 59

## 2016-04-29 ENCOUNTER — Ambulatory Visit (INDEPENDENT_AMBULATORY_CARE_PROVIDER_SITE_OTHER): Payer: 59 | Admitting: Obstetrics & Gynecology

## 2016-04-29 VITALS — BP 120/70 | Wt 247.0 lb

## 2016-04-29 DIAGNOSIS — Z3A13 13 weeks gestation of pregnancy: Secondary | ICD-10-CM

## 2016-04-29 DIAGNOSIS — Z981 Arthrodesis status: Secondary | ICD-10-CM | POA: Insufficient documentation

## 2016-04-29 DIAGNOSIS — O99212 Obesity complicating pregnancy, second trimester: Secondary | ICD-10-CM

## 2016-04-29 DIAGNOSIS — Z6841 Body Mass Index (BMI) 40.0 and over, adult: Secondary | ICD-10-CM | POA: Insufficient documentation

## 2016-04-29 NOTE — Progress Notes (Signed)
Pain in epigastrium over old lap scar; monitor for hernia.  No signs or exam findings c/w hernia today. PNV.  Tums for GERD.

## 2016-05-28 ENCOUNTER — Ambulatory Visit (INDEPENDENT_AMBULATORY_CARE_PROVIDER_SITE_OTHER): Payer: 59 | Admitting: Obstetrics & Gynecology

## 2016-05-28 VITALS — BP 120/70 | Wt 244.0 lb

## 2016-05-28 DIAGNOSIS — Z6841 Body Mass Index (BMI) 40.0 and over, adult: Secondary | ICD-10-CM

## 2016-05-28 DIAGNOSIS — O0993 Supervision of high risk pregnancy, unspecified, third trimester: Secondary | ICD-10-CM | POA: Insufficient documentation

## 2016-05-28 DIAGNOSIS — Z3490 Encounter for supervision of normal pregnancy, unspecified, unspecified trimester: Secondary | ICD-10-CM

## 2016-05-28 DIAGNOSIS — Z3A18 18 weeks gestation of pregnancy: Secondary | ICD-10-CM

## 2016-05-28 NOTE — Progress Notes (Signed)
Low back pain, sciatica.  Rest, support, heat, Tylenol. US nv.

## 2016-06-05 ENCOUNTER — Emergency Department
Admission: EM | Admit: 2016-06-05 | Discharge: 2016-06-05 | Disposition: A | Discharge status: Home / Self Care | Payer: 59 | Attending: Emergency Medicine | Admitting: Emergency Medicine

## 2016-06-05 ENCOUNTER — Encounter: Payer: Self-pay | Admitting: Emergency Medicine

## 2016-06-05 DIAGNOSIS — J45909 Unspecified asthma, uncomplicated: Secondary | ICD-10-CM | POA: Diagnosis not present

## 2016-06-05 DIAGNOSIS — Z3A19 19 weeks gestation of pregnancy: Secondary | ICD-10-CM | POA: Insufficient documentation

## 2016-06-05 DIAGNOSIS — F17210 Nicotine dependence, cigarettes, uncomplicated: Secondary | ICD-10-CM | POA: Insufficient documentation

## 2016-06-05 DIAGNOSIS — M5442 Lumbago with sciatica, left side: Secondary | ICD-10-CM | POA: Diagnosis not present

## 2016-06-05 DIAGNOSIS — O99332 Smoking (tobacco) complicating pregnancy, second trimester: Secondary | ICD-10-CM | POA: Diagnosis not present

## 2016-06-05 DIAGNOSIS — Z79899 Other long term (current) drug therapy: Secondary | ICD-10-CM | POA: Insufficient documentation

## 2016-06-05 DIAGNOSIS — O26892 Other specified pregnancy related conditions, second trimester: Secondary | ICD-10-CM | POA: Insufficient documentation

## 2016-06-05 DIAGNOSIS — I10 Essential (primary) hypertension: Secondary | ICD-10-CM | POA: Diagnosis not present

## 2016-06-05 LAB — URINALYSIS, COMPLETE (UACMP) WITH MICROSCOPIC
BILIRUBIN URINE: NEGATIVE
Bacteria, UA: NONE SEEN
GLUCOSE, UA: NEGATIVE mg/dL
HGB URINE DIPSTICK: NEGATIVE
Ketones, ur: NEGATIVE mg/dL
Leukocytes, UA: NEGATIVE
NITRITE: NEGATIVE
PH: 5 (ref 5.0–8.0)
Protein, ur: NEGATIVE mg/dL
Specific Gravity, Urine: 1.02 (ref 1.005–1.030)

## 2016-06-05 MED ORDER — LIDOCAINE 5 % EX PTCH
1.0000 | MEDICATED_PATCH | CUTANEOUS | Status: DC
  Administered 2016-06-05: 1 via TRANSDERMAL
  Filled 2016-06-05: qty 1

## 2016-06-05 MED ORDER — LIDOCAINE 5 % EX PTCH: 1.0000 | MEDICATED_PATCH | CUTANEOUS | 0 refills | Status: DC

## 2016-06-05 NOTE — ED Provider Notes (Signed)
[facility] Emergency Department Provider Note  ____________________________________________  Time seen: Approximately 9:20 PM  I have reviewed the triage vital signs and the nursing notes.   HISTORY  Chief Complaint Back Pain    HPI Courtney Sanchez is a 26 y.o. female presents to the emergency department with low back pain for several weeks. Pain begins in lower left back and radiates down the back of her left leg. Pain feels like a shooting pain.  Patient states that she has a history of a bulging disc and sees the orthopedic surgeon. She spoke with her OB/GYN about back pain and has been told to see her surgeon. She is [redacted] weeks pregnant. She saw her OB/GYN on March 22. She has a follow-up appointment on Wednesday. She denies headache, cough, shortness of breath, chest pain, nausea, vomiting, abdominal pain, contractions, dysuria, urgency, frequency, vaginal discharge.   Past Medical History:  Diagnosis Date  . Asthma   . Endometriosis   . Hidradenitis   . Hydronephrosis   . Hypertension   . IBS (irritable bowel syndrome)   . Migraine   . Migraine   . Scoliosis     Patient Active Problem List   Diagnosis Date Noted  . Encounter for supervision of low-risk pregnancy, antepartum 05/28/2016  . History of spinal fusion 04/29/2016  . BMI 40.0-44.9, adult (HCC) 04/29/2016    Past Surgical History:  Procedure Laterality Date  . COLONOSCOPY  2001;2008  . LAPAROSCOPY  04/2007  . SPINAL FUSION    . SPINAL FUSION  2008  . TONSILLECTOMY      Prior to Admission medications   Medication Sig Start Date End Date Taking? Authorizing Provider  lidocaine (LIDODERM) 5 % Place 1 patch onto the skin daily. Remove & Discard patch within 12 hours or as directed by MD 06/05/16 06/05/17  Enid Derry, PA-C  Prenatal Vit-Fe Fumarate-FA (MULTIVITAMIN-PRENATAL) 27-0.8 MG TABS tablet Take 1 tablet by mouth daily at 12 noon.    Historical Provider, MD     Allergies Amoxicillin; Cefaclor; Cefadroxil; Cefuroxime; Cephalexin; Clarithromycin; Clindamycin; Doxycycline; Erythromycin; Penicillins; Sulfamethoxazole-trimethoprim; Vitamin a; Dextromethorphan hbr; Morphine; Other; Azithromycin; and Red dye  History reviewed. No pertinent family history.  Social History Social History  Substance Use Topics  . Smoking status: Current Some Day Smoker    Packs/day: 1.00    Types: Cigarettes  . Smokeless tobacco: Never Used  . Alcohol use No     Review of Systems  Constitutional: No fever/chills ENT: No upper respiratory complaints. Cardiovascular: No chest pain. Respiratory: No cough. No SOB. Gastrointestinal: No abdominal pain.  No nausea, no vomiting.  Genitourinary: Negative for dysuria. Musculoskeletal: Positive for low back pain. Skin: Negative for rash, abrasions, lacerations, ecchymosis. Neurological: Negative for headaches, numbness or tingling   ____________________________________________   PHYSICAL EXAM:  VITAL SIGNS: ED Triage Vitals  Enc Vitals Group     BP 06/05/16 2029 (!) 153/53     Pulse Rate 06/05/16 2029 87     Resp 06/05/16 2029 18     Temp 06/05/16 2029 98.1 F (36.7 C)     Temp Source 06/05/16 2029 Oral     SpO2 06/05/16 2029 99 %     Weight 06/05/16 2030 242 lb (109.8 kg)     Height 06/05/16 2030  (1.651 m)     Head Circumference --      Peak Flow --      Pain Score 06/05/16 2029 8     Pain Loc --  Pain Edu? --      Excl. in GC? --      Constitutional: Alert and oriented. Well appearing and in no acute distress. Eyes: Conjunctivae are normal. PERRL. EOMI. Head: Atraumatic. ENT:      Ears:      Nose: No congestion/rhinnorhea.      Mouth/Throat: Mucous membranes are moist.  Neck: No stridor.  Cardiovascular: Normal rate, regular rhythm.  Good peripheral circulation. Respiratory: Normal respiratory effort without tachypnea or retractions. Lungs CTAB. Good air entry to the bases with no  decreased or absent breath sounds. Gastrointestinal: Bowel sounds 4 quadrants. No CVA tenderness. Musculoskeletal: Full range of motion to all extremities. No gross deformities appreciated.Tenderness to palpation over lumbar spine. Tender to palpation over left SI joint. Positive straight leg raise. Negative cross leg raise. Neurologic:  Normal speech and language. No gross focal neurologic deficits are appreciated.  Skin:  Skin is warm, dry and intact. No rash noted.   ____________________________________________   LABS (all labs ordered are listed, but only abnormal results are displayed)  Labs Reviewed  URINALYSIS, COMPLETE (UACMP) WITH MICROSCOPIC - Abnormal; Notable for the following:       Result Value   Color, Urine YELLOW (*)    APPearance HAZY (*)    Squamous Epithelial / LPF 6-30 (*)    All other components within normal limits   ____________________________________________  EKG   ____________________________________________  RADIOLOGY  No results found.  ____________________________________________    PROCEDURES  Procedure(s) performed:    Procedures    Medications  lidocaine (LIDODERM) 5 % 1 patch (1 patch Transdermal Patch Applied 06/05/16 2138)     ____________________________________________   INITIAL IMPRESSION / ASSESSMENT AND PLAN / ED COURSE  Pertinent labs & imaging results that were available during my care of the patient were reviewed by me and considered in my medical decision making (see chart for details).  Review of the [place] CSRS was performed in accordance of the [facility] prior to dispensing any controlled drugs.   Patient's diagnosis is consistent with low back pain with sciatica. Vital signs and exam are reassuring. No indication of infection on urinalysis. Fetal heart tones assessed at 151 bpm.  Patient will be discharged home with prescriptions for lidocaine patch. Patient is to follow up with OB/GYN and orthopedics as directed.  Patient is given ED precautions to return to the ED for any worsening or new symptoms.     ____________________________________________  FINAL CLINICAL IMPRESSION(S) / ED DIAGNOSES  Final diagnoses:  Acute left-sided low back pain with left-sided sciatica      NEW MEDICATIONS STARTED DURING THIS VISIT:  Discharge Medication List as of 06/05/2016  9:32 PM    START taking these medications   Details  lidocaine (LIDODERM) 5 % Place 1 patch onto the skin daily. Remove & Discard patch within 12 hours or as directed by MD, Starting Thu 06/05/2016, Until Fri 06/05/2017, Print            This chart was dictated using voice recognition software/Dragon. Despite best efforts to proofread, errors can occur which can change the meaning. Any change was purely unintentional.    Enid Derry, PA-C 06/05/16 2207    Phineas Semen, MD 06/05/16 2246

## 2016-06-05 NOTE — ED Triage Notes (Signed)
Pt ambulatory to triage in NAD, report [redacted] weeks pregnant, report lower back pain, radiating down left hip and leg.

## 2016-06-06 ENCOUNTER — Telehealth: Payer: Self-pay

## 2016-06-06 NOTE — Telephone Encounter (Signed)
Pt calling was seen in ED last night for back pain.  Is 19wks preg.  Was told she has a bulging disk and sciatica.  Was given lidocaine patches and wants to know if okay to use.  Left detailed msg AMS said okay to use.

## 2016-06-11 ENCOUNTER — Ambulatory Visit (INDEPENDENT_AMBULATORY_CARE_PROVIDER_SITE_OTHER): Payer: 59

## 2016-06-11 ENCOUNTER — Ambulatory Visit (INDEPENDENT_AMBULATORY_CARE_PROVIDER_SITE_OTHER): Payer: 59 | Admitting: Obstetrics and Gynecology

## 2016-06-11 VITALS — BP 118/78 | Wt 250.0 lb

## 2016-06-11 DIAGNOSIS — Z6841 Body Mass Index (BMI) 40.0 and over, adult: Secondary | ICD-10-CM | POA: Diagnosis not present

## 2016-06-11 DIAGNOSIS — Z3A20 20 weeks gestation of pregnancy: Secondary | ICD-10-CM

## 2016-06-11 DIAGNOSIS — Z3A18 18 weeks gestation of pregnancy: Secondary | ICD-10-CM

## 2016-06-11 DIAGNOSIS — Z362 Encounter for other antenatal screening follow-up: Secondary | ICD-10-CM | POA: Diagnosis not present

## 2016-06-11 DIAGNOSIS — M5432 Sciatica, left side: Secondary | ICD-10-CM

## 2016-06-11 DIAGNOSIS — O99213 Obesity complicating pregnancy, third trimester: Secondary | ICD-10-CM | POA: Insufficient documentation

## 2016-06-11 DIAGNOSIS — O0992 Supervision of high risk pregnancy, unspecified, second trimester: Secondary | ICD-10-CM

## 2016-06-11 DIAGNOSIS — Z3490 Encounter for supervision of normal pregnancy, unspecified, unspecified trimester: Secondary | ICD-10-CM | POA: Diagnosis not present

## 2016-06-11 DIAGNOSIS — O99212 Obesity complicating pregnancy, second trimester: Secondary | ICD-10-CM

## 2016-06-11 MED ORDER — CYCLOBENZAPRINE HCL 10 MG PO TABS: 10.0000 mg | ORAL_TABLET | Freq: Three times a day (TID) | ORAL | 0 refills | Status: DC | PRN

## 2016-06-11 NOTE — Progress Notes (Signed)
Anatomy scan today incomplete. Will get f/u anatomy at [facility]. No vb. No lof.  Flexeril for sciatica

## 2016-06-14 ENCOUNTER — Observation Stay
Admission: RE | Admit: 2016-06-14 | Discharge: 2016-06-14 | Disposition: A | Discharge status: Home / Self Care | Payer: 59 | Source: Ambulatory Visit | Attending: Obstetrics & Gynecology | Admitting: Obstetrics & Gynecology

## 2016-06-14 DIAGNOSIS — N390 Urinary tract infection, site not specified: Secondary | ICD-10-CM

## 2016-06-14 DIAGNOSIS — N3000 Acute cystitis without hematuria: Secondary | ICD-10-CM | POA: Insufficient documentation

## 2016-06-14 DIAGNOSIS — Z880 Allergy status to penicillin: Secondary | ICD-10-CM | POA: Diagnosis not present

## 2016-06-14 DIAGNOSIS — J45909 Unspecified asthma, uncomplicated: Secondary | ICD-10-CM | POA: Diagnosis not present

## 2016-06-14 DIAGNOSIS — Z3A00 Weeks of gestation of pregnancy not specified: Secondary | ICD-10-CM | POA: Diagnosis not present

## 2016-06-14 DIAGNOSIS — O2392 Unspecified genitourinary tract infection in pregnancy, second trimester: Secondary | ICD-10-CM | POA: Diagnosis not present

## 2016-06-14 DIAGNOSIS — R109 Unspecified abdominal pain: Secondary | ICD-10-CM | POA: Diagnosis not present

## 2016-06-14 DIAGNOSIS — O99332 Smoking (tobacco) complicating pregnancy, second trimester: Secondary | ICD-10-CM | POA: Insufficient documentation

## 2016-06-14 DIAGNOSIS — O2340 Unspecified infection of urinary tract in pregnancy, unspecified trimester: Secondary | ICD-10-CM | POA: Diagnosis not present

## 2016-06-14 DIAGNOSIS — Z885 Allergy status to narcotic agent status: Secondary | ICD-10-CM | POA: Diagnosis not present

## 2016-06-14 DIAGNOSIS — Z9102 Food additives allergy status: Secondary | ICD-10-CM | POA: Insufficient documentation

## 2016-06-14 DIAGNOSIS — O162 Unspecified maternal hypertension, second trimester: Secondary | ICD-10-CM | POA: Insufficient documentation

## 2016-06-14 DIAGNOSIS — Z882 Allergy status to sulfonamides status: Secondary | ICD-10-CM | POA: Insufficient documentation

## 2016-06-14 DIAGNOSIS — O26899 Other specified pregnancy related conditions, unspecified trimester: Secondary | ICD-10-CM

## 2016-06-14 DIAGNOSIS — Z881 Allergy status to other antibiotic agents status: Secondary | ICD-10-CM | POA: Diagnosis not present

## 2016-06-14 DIAGNOSIS — Z3A20 20 weeks gestation of pregnancy: Secondary | ICD-10-CM | POA: Diagnosis not present

## 2016-06-14 HISTORY — DX: Sciatica, unspecified side: M54.30

## 2016-06-14 LAB — URINALYSIS, COMPLETE (UACMP) WITH MICROSCOPIC
Bacteria, UA: NONE SEEN
Bilirubin Urine: NEGATIVE
GLUCOSE, UA: NEGATIVE mg/dL
Hgb urine dipstick: NEGATIVE
Ketones, ur: NEGATIVE mg/dL
Leukocytes, UA: NEGATIVE
Nitrite: NEGATIVE
Protein, ur: 100 mg/dL — AB
SPECIFIC GRAVITY, URINE: 1.028 (ref 1.005–1.030)
pH: 5 (ref 5.0–8.0)

## 2016-06-14 MED ORDER — ACETAMINOPHEN 325 MG PO TABS: 650.0000 mg | ORAL_TABLET | ORAL | Status: DC | PRN

## 2016-06-14 MED ORDER — ONDANSETRON HCL 4 MG/2ML IJ SOLN: 4.0000 mg | Freq: Four times a day (QID) | INTRAMUSCULAR | Status: DC | PRN

## 2016-06-14 MED ORDER — NITROFURANTOIN MONOHYD MACRO 100 MG PO CAPS: 100.0000 mg | ORAL_CAPSULE | Freq: Two times a day (BID) | ORAL | 0 refills | Status: DC

## 2016-06-14 MED ORDER — NITROFURANTOIN MONOHYD MACRO 100 MG PO CAPS
100.0000 mg | ORAL_CAPSULE | Freq: Two times a day (BID) | ORAL | Status: DC
  Administered 2016-06-14: 100 mg via ORAL
  Filled 2016-06-14: qty 1

## 2016-06-14 NOTE — Final Progress Note (Signed)
Physician Final Progress Note  Patient ID: Courtney Sanchez MRN: [id number] DOB/AGE: [birth year] 26 y.o.  Admit date: 06/14/2016 Admitting provider: Nadara Mustard, MD Discharge date: 06/14/2016   Admission Diagnoses: Abdominal pain in pregnancy  Discharge Diagnoses:  Principal Problem:   UTI (urinary tract infection) Active Problems:   Abdominal pain affecting pregnancy  Consults: None  Significant Findings/ Diagnostic Studies:  HPI:      [name] is a 26 y.o. G1P0 who LMP was Patient's last menstrual period was 01/23/2016 (exact date)., presents today for a problem visit.    Urinary Tract Infection: Patient complains of pain in the RLQ . She has had symptoms for 1 day. Patient also complains of back pain and epigastic pain. Patient denies fever and headache. Patient does not have a history of recurrent UTI.  Patient does not have a history of pyelonephritis.   PMHx: She  has a past medical history of Asthma; Endometriosis; Endometriosis; Hidradenitis; Hydronephrosis; Hypertension; IBS (irritable bowel syndrome); Migraine; Migraine; Sciatica; and Scoliosis. Also,  has a past surgical history that includes Spinal fusion; Tonsillectomy; Colonoscopy (5621;3086); laparoscopy (04/2007); Spinal fusion (2008); and Kidney surgery (Right, 2012)., family history is not on file.,  reports that she has been smoking Cigarettes.  She has been smoking about 0.50 packs per day. She has never used smokeless tobacco. She reports that she does not drink alcohol or use drugs.  She takes PNV.  Also, is allergic to amoxicillin; cefaclor; cefadroxil; cefuroxime; cephalexin; clarithromycin; clindamycin; doxycycline; erythromycin; penicillins; sulfamethoxazole-trimethoprim; vitamin a; dextromethorphan hbr; morphine; other; azithromycin; and red dye.  Review of Systems  Constitutional: Negative for chills, fever and malaise/fatigue.  HENT: Negative for congestion, sinus pain and sore  throat.   Eyes: Negative for blurred vision and pain.  Respiratory: Negative for cough and wheezing.   Cardiovascular: Negative for chest pain and leg swelling.  Gastrointestinal: Negative for abdominal pain, constipation, diarrhea, heartburn, nausea and vomiting.  Genitourinary: Negative for dysuria, frequency, hematuria and urgency.  Musculoskeletal: Negative for back pain, joint pain, myalgias and neck pain.  Skin: Negative for itching and rash.  Neurological: Negative for dizziness, tremors and weakness.  Endo/Heme/Allergies: Does not bruise/bleed easily.  Psychiatric/Behavioral: Negative for depression. The patient is not nervous/anxious and does not have insomnia.   - neg except as above  Objective: BP 117/65   Pulse 89   Temp 98.1 F (36.7 C) (Oral)   Resp 16   Ht  (1.651 m)   Wt 243 lb (110.2 kg)   LMP 01/23/2016 (Exact Date)   BMI 40.44 kg/m  Physical Exam  Nursing note and vitals reviewed.   Physical examination Constitutional NAD, Conversant  Thyroid/Neck Smooth without nodularity or enlargement. Normal ROM.  Neck Supple.  Skin No rashes, lesions or ulceration. Normal palpated skin turgor. No nodularity.  Lungs: Clear to auscultation.No rales or wheezes. Normal Respiratory effort, no retractions.  Heart: NSR.  No murmurs or rubs appreciated. No periferal edema  Abdomen: Soft.  Mild right sided tenderness.  No masses.  No HSM. No hernia. Uterus gravid and firm.  FHT 140s.  Extremities: Moves all appropriately.  Normal ROM for age. No lymphadenopathy.  Neuro: Grossly intact  Psych: Oriented to PPT.  Normal mood. Normal affect.   ASSESSMENT/PLAN:   Acute cystitis  Procedures: FHT 140s  Discharge Condition: good  Disposition: 01-Home or Self Care  Diet: Regular diet  Discharge Activity: Activity as tolerated   Allergies as of 06/14/2016      Reactions  Amoxicillin Rash   Cefaclor Rash   Cefadroxil Rash   Cefuroxime Rash   Cephalexin Rash    Clarithromycin Rash   Clindamycin Anaphylaxis   Doxycycline Swelling   Optic pressure_fluid swelling   Erythromycin Rash   Penicillins Rash   Sulfamethoxazole-trimethoprim Rash   Vitamin A    Other reaction(s): Other (See Comments) Causes pressure in brain to go up and she has to have lumbar puncture to draw fluid off.   Dextromethorphan Hbr Rash, Swelling   Morphine Itching   Other    Other reaction(s): Other (See Comments) "Pretty much every antibiotic there is" "Pretty much every antibiotic there is"   Azithromycin Rash   Red Dye Rash      Medication List    TAKE these medications   cyclobenzaprine 10 MG tablet Commonly known as:  FLEXERIL Take 1 tablet (10 mg total) by mouth every 8 (eight) hours as needed for muscle spasms.   lidocaine 5 % Commonly known as:  LIDODERM Place 1 patch onto the skin daily. Remove & Discard patch within 12 hours or as directed by MD   multivitamin-prenatal 27-0.8 MG Tabs tablet Take 1 tablet by mouth daily at 12 noon.   nitrofurantoin (macrocrystal-monohydrate) 100 MG capsule Commonly known as:  MACROBID Take 1 capsule (100 mg total) by mouth every 12 (twelve) hours.      Follow-up Information    Letitia Libra, MD. Go in 3 week(s).   Specialty:  Obstetrics and Gynecology Contact information: [address] [place] [address] [phone]           Total time spent taking care of this patient: 15 minutes  Signed: Letitia Libra 06/14/2016, 9:23 AM

## 2016-06-14 NOTE — Discharge Summary (Signed)
  See FPN 

## 2016-06-14 NOTE — Discharge Instructions (Signed)

## 2016-07-03 ENCOUNTER — Other Ambulatory Visit: Payer: Self-pay | Admitting: Obstetrics & Gynecology

## 2016-07-03 ENCOUNTER — Observation Stay
Admission: EM | Admit: 2016-07-03 | Discharge: 2016-07-03 | Disposition: A | Discharge status: Home / Self Care | Payer: 59 | Attending: Obstetrics & Gynecology | Admitting: Obstetrics & Gynecology

## 2016-07-03 ENCOUNTER — Encounter: Payer: Self-pay | Admitting: *Deleted

## 2016-07-03 DIAGNOSIS — O368120 Decreased fetal movements, second trimester, not applicable or unspecified: Secondary | ICD-10-CM | POA: Diagnosis present

## 2016-07-03 DIAGNOSIS — Z3A23 23 weeks gestation of pregnancy: Secondary | ICD-10-CM | POA: Insufficient documentation

## 2016-07-03 DIAGNOSIS — Z3490 Encounter for supervision of normal pregnancy, unspecified, unspecified trimester: Secondary | ICD-10-CM

## 2016-07-03 DIAGNOSIS — O99212 Obesity complicating pregnancy, second trimester: Secondary | ICD-10-CM

## 2016-07-03 DIAGNOSIS — O26899 Other specified pregnancy related conditions, unspecified trimester: Secondary | ICD-10-CM

## 2016-07-03 DIAGNOSIS — Z881 Allergy status to other antibiotic agents status: Secondary | ICD-10-CM | POA: Diagnosis not present

## 2016-07-03 DIAGNOSIS — R109 Unspecified abdominal pain: Secondary | ICD-10-CM

## 2016-07-03 DIAGNOSIS — Z885 Allergy status to narcotic agent status: Secondary | ICD-10-CM | POA: Insufficient documentation

## 2016-07-03 DIAGNOSIS — Z9102 Food additives allergy status: Secondary | ICD-10-CM | POA: Insufficient documentation

## 2016-07-03 DIAGNOSIS — Z880 Allergy status to penicillin: Secondary | ICD-10-CM | POA: Diagnosis not present

## 2016-07-03 DIAGNOSIS — O0992 Supervision of high risk pregnancy, unspecified, second trimester: Secondary | ICD-10-CM

## 2016-07-03 DIAGNOSIS — Z882 Allergy status to sulfonamides status: Secondary | ICD-10-CM | POA: Insufficient documentation

## 2016-07-03 LAB — URINALYSIS, ROUTINE W REFLEX MICROSCOPIC
BILIRUBIN URINE: NEGATIVE
GLUCOSE, UA: NEGATIVE mg/dL
HGB URINE DIPSTICK: NEGATIVE
KETONES UR: NEGATIVE mg/dL
Leukocytes, UA: NEGATIVE
Nitrite: NEGATIVE
PROTEIN: NEGATIVE mg/dL
Specific Gravity, Urine: 1.014 (ref 1.005–1.030)
pH: 7 (ref 5.0–8.0)

## 2016-07-03 MED ORDER — ONDANSETRON 4 MG PO TBDP
ORAL_TABLET | ORAL | Status: AC
  Administered 2016-07-03: 4 mg via ORAL
  Filled 2016-07-03: qty 1

## 2016-07-03 MED ORDER — ONDANSETRON 4 MG PO TBDP
4.0000 mg | ORAL_TABLET | Freq: Once | ORAL | Status: AC
  Administered 2016-07-03: 4 mg via ORAL

## 2016-07-03 MED ORDER — ACETAMINOPHEN 325 MG PO TABS
650.0000 mg | ORAL_TABLET | Freq: Four times a day (QID) | ORAL | Status: DC | PRN
  Administered 2016-07-03: 650 mg via ORAL
  Filled 2016-07-03: qty 2

## 2016-07-03 MED ORDER — ONDANSETRON 4 MG PO TBDP: 4.0000 mg | ORAL_TABLET | Freq: Three times a day (TID) | ORAL | 0 refills | Status: DC | PRN

## 2016-07-03 NOTE — Discharge Instructions (Signed)
Take zofran as needed. Stay well hydrated and get plenty of rest!

## 2016-07-03 NOTE — OB Triage Note (Signed)
Recvd pt from ED. Pt c/o vomiting and diarrhea that started about 24 hours ago. Pt says it started after she had dinner. Pt says she has thrown up more than she can count and has had diarrhea 4-5 times in the past 24 hours. Feeling baby move ok. Intercourse in the past 24 hours. No vaginal bleeding or abnormal discharge. C/o headache that she rates an 8 out of 10. Pt says she has a hx of migraines. Stomach muscles hurt from vomiting.

## 2016-07-03 NOTE — Discharge Summary (Signed)
  See FPN 

## 2016-07-03 NOTE — Final Progress Note (Signed)
Physician Final Progress Note  Patient ID: Courtney Sanchez MRN: [id number] DOB/AGE: 1990-09-16 25 y.o.  Admit date: 07/03/2016 Admitting provider: Nadara Mustard, MD Discharge date: 07/03/2016   Admission Diagnoses: Nausea, Decreased fetal movement  Discharge Diagnoses:  Active Problems:   Pregnancy   Nausea, decreased fetal movement  Consults: None  Significant Findings/ Diagnostic Studies:Pt seen in triage for nausea and vomiting and diarrhea at 23 weeks in first pregnancy.  Also decreased fetal movements although feels move better once here.   PMHx: She  has a past medical history of Asthma; Endometriosis; Endometriosis; Hidradenitis; Hydronephrosis; Hypertension; IBS (irritable bowel syndrome); Migraine; Migraine; Sciatica; and Scoliosis. Also,  has a past surgical history that includes Spinal fusion; Tonsillectomy; Colonoscopy (6962;9528); laparoscopy (04/2007); Spinal fusion (2008); and Kidney surgery (Right, 2012)., family history is not on file.,  reports that she has been smoking Cigarettes.  She has been smoking about 0.50 packs per day. She has never used smokeless tobacco. She reports that she does not drink alcohol or use drugs.  She takes PNV.  Also, is allergic to amoxicillin; cefaclor; cefadroxil; cefuroxime; cephalexin; clarithromycin; clindamycin; doxycycline; erythromycin; penicillins; sulfamethoxazole-trimethoprim; vitamin a; dextromethorphan hbr; morphine; other; azithromycin; and red dye.  Review of Systems  Constitutional: Negative for chills, fever and malaise/fatigue.  HENT: Negative for congestion, sinus pain and sore throat.   Eyes: Negative for blurred vision and pain.  Respiratory: Negative for cough and wheezing.   Cardiovascular: Negative for chest pain and leg swelling.  Gastrointestinal: Negative for abdominal pain, constipation, diarrhea, heartburn, nausea and vomiting.  Genitourinary: Negative for dysuria, frequency, hematuria and urgency.   Musculoskeletal: Negative for back pain, joint pain, myalgias and neck pain.  Skin: Negative for itching and rash.  Neurological: Negative for dizziness, tremors and weakness.  Endo/Heme/Allergies: Does not bruise/bleed easily.  Psychiatric/Behavioral: Negative for depression. The patient is not nervous/anxious and does not have insomnia.   - neg except as above FHT 140s. Nursing report of no contractions or pain. Nausea improved w Zofran  Procedures: none  Discharge Condition: good  Disposition: 01-Home or Self Care  Diet: Regular diet  Discharge Activity: Activity as tolerated   Allergies as of 07/03/2016      Reactions   Amoxicillin Rash   Cefaclor Rash   Cefadroxil Rash   Cefuroxime Rash   Cephalexin Rash   Clarithromycin Rash   Clindamycin Anaphylaxis   Doxycycline Swelling   Optic pressure_fluid swelling   Erythromycin Rash   Penicillins Rash   Sulfamethoxazole-trimethoprim Rash   Vitamin A    Other reaction(s): Other (See Comments) Causes pressure in brain to go up and she has to have lumbar puncture to draw fluid off.   Dextromethorphan Hbr Rash, Swelling   Morphine Itching   Other    Other reaction(s): Other (See Comments) "Pretty much every antibiotic there is" "Pretty much every antibiotic there is"   Azithromycin Rash   Red Dye Rash      Medication List    ASK your doctor about these medications   cyclobenzaprine 10 MG tablet Commonly known as:  FLEXERIL Take 1 tablet (10 mg total) by mouth every 8 (eight) hours as needed for muscle spasms.   lidocaine 5 % Commonly known as:  LIDODERM Place 1 patch onto the skin daily. Remove & Discard patch within 12 hours or as directed by MD   multivitamin-prenatal 27-0.8 MG Tabs tablet Take 1 tablet by mouth daily at 12 noon.   nitrofurantoin (macrocrystal-monohydrate) 100 MG capsule Commonly known as:  MACROBID Take 1 capsule (100 mg total) by mouth every 12 (twelve) hours.      Follow-up  Information    Letitia Libraobert Paul Harris, MD Follow up.   Specialty:  Obstetrics and Gynecology Contact information: [address] [place] [address] [phone]           Total time spent taking care of this patient: TRIAGE Patient presented for evaluation of labor.  Patient had  exam by RN and this was reported to me. I reviewed her vital signs and fetal tracing, both of which were reassuring.  Patient was discharge as she was not laboring.   Signed: Letitia Libraobert Paul Harris 07/03/2016, 7:38 AM

## 2016-07-08 ENCOUNTER — Telehealth: Payer: Self-pay

## 2016-07-08 MED ORDER — NUTRITIONAL SUPPLEMENT PO LIQD: 8.0000 [oz_av] | Freq: Two times a day (BID) | ORAL | 12 refills | Status: DC

## 2016-07-08 NOTE — Telephone Encounter (Signed)
Pt calling.  She is 23wks preg and throwing up 2-3 times a day.  Was in hosp a couple night ago for it.  [facility]  They told her she has lost 5lbs and sugg she get rx for boost or ensure to help her gain weight.  Also, pt wants to know if she needs to come in and be seen by us.  [phone].

## 2016-07-08 NOTE — Telephone Encounter (Signed)
Pt needs a rx to give to wic for the boost/ensure

## 2016-07-08 NOTE — Telephone Encounter (Signed)
[place] if [place]  OK to try Boost or Ensure as supplements.

## 2016-07-08 NOTE — Telephone Encounter (Signed)
Rx in epic for nutritional supplement as such

## 2016-07-08 NOTE — Telephone Encounter (Signed)
msg left, no appointments available tomorrow but Courtney Sanchez does have availability on Thursday morning.

## 2016-07-10 ENCOUNTER — Ambulatory Visit (INDEPENDENT_AMBULATORY_CARE_PROVIDER_SITE_OTHER): Payer: 59 | Admitting: Advanced Practice Midwife

## 2016-07-10 VITALS — BP 124/74 | Wt 245.0 lb

## 2016-07-10 DIAGNOSIS — O219 Vomiting of pregnancy, unspecified: Secondary | ICD-10-CM

## 2016-07-10 DIAGNOSIS — Z3A24 24 weeks gestation of pregnancy: Secondary | ICD-10-CM

## 2016-07-10 MED ORDER — DOXYLAMINE-PYRIDOXINE 10-10 MG PO TBEC: 2.0000 | DELAYED_RELEASE_TABLET | Freq: Every day | ORAL | 2 refills | Status: DC

## 2016-07-10 NOTE — Progress Notes (Signed)
Pt states that she has been unable to keep anything down for 2 weeks. She says she is trying to stay hydrated. She denies viral symptoms- HA, congestion, diarrhea. Zofran has not been helping. Rx sent for diclegis today. Other N&V comfort measures reviewed.

## 2016-07-10 NOTE — Progress Notes (Signed)
Pt c/o being very sick on her stomach and nauseas for about 2 weeks. zofran not helping.

## 2016-07-14 ENCOUNTER — Ambulatory Visit (INDEPENDENT_AMBULATORY_CARE_PROVIDER_SITE_OTHER): Payer: 59

## 2016-07-14 ENCOUNTER — Ambulatory Visit (INDEPENDENT_AMBULATORY_CARE_PROVIDER_SITE_OTHER): Payer: 59 | Admitting: Obstetrics and Gynecology

## 2016-07-14 VITALS — BP 126/80 | Wt 243.0 lb

## 2016-07-14 DIAGNOSIS — Z113 Encounter for screening for infections with a predominantly sexual mode of transmission: Secondary | ICD-10-CM | POA: Diagnosis not present

## 2016-07-14 DIAGNOSIS — Z6841 Body Mass Index (BMI) 40.0 and over, adult: Secondary | ICD-10-CM

## 2016-07-14 DIAGNOSIS — Z131 Encounter for screening for diabetes mellitus: Secondary | ICD-10-CM | POA: Diagnosis not present

## 2016-07-14 DIAGNOSIS — Z3A24 24 weeks gestation of pregnancy: Secondary | ICD-10-CM

## 2016-07-14 DIAGNOSIS — O99212 Obesity complicating pregnancy, second trimester: Secondary | ICD-10-CM

## 2016-07-14 DIAGNOSIS — Z369 Encounter for antenatal screening, unspecified: Secondary | ICD-10-CM | POA: Diagnosis not present

## 2016-07-14 DIAGNOSIS — O0992 Supervision of high risk pregnancy, unspecified, second trimester: Secondary | ICD-10-CM | POA: Diagnosis not present

## 2016-07-14 NOTE — Progress Notes (Signed)
Pt has swelling in feet/legs but has been working a lot. No vb. No lof. Anatomy screen complete, but suboptimal. However, on all views no obvious malformation. 2 weeks of nausea with emesis, zofran not helping. No fevers, chills, abd pain, no diarrhea. 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 No headaches, no visual changes, normotensive today.  Recommend diclegis. If not well in 2 weeks, return to clinic.

## 2016-07-16 ENCOUNTER — Encounter: Payer: Self-pay | Admitting: *Deleted

## 2016-07-16 ENCOUNTER — Observation Stay
Admission: EM | Admit: 2016-07-16 | Discharge: 2016-07-17 | Disposition: A | Discharge status: Home / Self Care | Payer: 59 | Attending: Obstetrics and Gynecology | Admitting: Obstetrics and Gynecology

## 2016-07-16 DIAGNOSIS — R102 Pelvic and perineal pain: Secondary | ICD-10-CM | POA: Insufficient documentation

## 2016-07-16 DIAGNOSIS — O162 Unspecified maternal hypertension, second trimester: Secondary | ICD-10-CM | POA: Insufficient documentation

## 2016-07-16 DIAGNOSIS — Z79899 Other long term (current) drug therapy: Secondary | ICD-10-CM | POA: Insufficient documentation

## 2016-07-16 DIAGNOSIS — R109 Unspecified abdominal pain: Secondary | ICD-10-CM

## 2016-07-16 DIAGNOSIS — O26892 Other specified pregnancy related conditions, second trimester: Principal | ICD-10-CM | POA: Insufficient documentation

## 2016-07-16 DIAGNOSIS — O26899 Other specified pregnancy related conditions, unspecified trimester: Secondary | ICD-10-CM

## 2016-07-16 DIAGNOSIS — O99212 Obesity complicating pregnancy, second trimester: Secondary | ICD-10-CM

## 2016-07-16 DIAGNOSIS — O0992 Supervision of high risk pregnancy, unspecified, second trimester: Secondary | ICD-10-CM

## 2016-07-16 DIAGNOSIS — Z3A25 25 weeks gestation of pregnancy: Secondary | ICD-10-CM | POA: Insufficient documentation

## 2016-07-16 NOTE — OB Triage Note (Signed)
Recvd pt from ED. Feels like "vagina is going to explode". Says it feels like pressure that started at 0300 this morning. States she had intercourse a couple hours before this pain started. States she feels right sided cramps. Feeling baby move ok. States she noticed leaking fluid right after she took a bath, but isn't sure if it is urine. Nitrazine negative. Pt says she is going to the restroom more often.

## 2016-07-17 DIAGNOSIS — O162 Unspecified maternal hypertension, second trimester: Secondary | ICD-10-CM | POA: Diagnosis not present

## 2016-07-17 DIAGNOSIS — O26892 Other specified pregnancy related conditions, second trimester: Secondary | ICD-10-CM | POA: Diagnosis not present

## 2016-07-17 DIAGNOSIS — Z3A35 35 weeks gestation of pregnancy: Secondary | ICD-10-CM | POA: Diagnosis not present

## 2016-07-17 DIAGNOSIS — Z79899 Other long term (current) drug therapy: Secondary | ICD-10-CM | POA: Diagnosis not present

## 2016-07-17 DIAGNOSIS — R102 Pelvic and perineal pain: Secondary | ICD-10-CM

## 2016-07-17 DIAGNOSIS — Z3A25 25 weeks gestation of pregnancy: Secondary | ICD-10-CM | POA: Diagnosis not present

## 2016-07-17 LAB — URINALYSIS, COMPLETE (UACMP) WITH MICROSCOPIC
BILIRUBIN URINE: NEGATIVE
Bacteria, UA: NONE SEEN
Glucose, UA: NEGATIVE mg/dL
HGB URINE DIPSTICK: NEGATIVE
KETONES UR: NEGATIVE mg/dL
Leukocytes, UA: NEGATIVE
NITRITE: NEGATIVE
PROTEIN: NEGATIVE mg/dL
Specific Gravity, Urine: 1.008 (ref 1.005–1.030)
pH: 7 (ref 5.0–8.0)

## 2016-07-17 LAB — CHLAMYDIA/NGC RT PCR (ARMC ONLY)
Chlamydia Tr: NOT DETECTED
N GONORRHOEAE: NOT DETECTED

## 2016-07-17 LAB — WET PREP, GENITAL
Sperm: NONE SEEN
Trich, Wet Prep: NONE SEEN
YEAST WET PREP: NONE SEEN

## 2016-07-17 MED ORDER — METRONIDAZOLE 500 MG PO TABS: 500.0000 mg | ORAL_TABLET | Freq: Two times a day (BID) | ORAL | 0 refills | Status: AC

## 2016-07-17 NOTE — Final Progress Note (Signed)
Physician Final Progress Note  Patient ID: Courtney Sanchez MRN: [id number] DOB/AGE: 24/11/1990 24 y.o.  Admit date: 07/16/2016 Admitting provider: Conard NovakStephen D Shatisha Falter, MD Discharge date: 07/17/2016   Admission Diagnoses:  1) intrauterine pregnancy at 8057w0d 2) leaking fluid 3) vaginal pressure  Discharge Diagnoses:  1) intrauterine pregnancy at 3757w0d 2) leaking fluid, no sign of rupture of membranes (all negative testing) 3) vaginal pressure   History of Present Illness: The patient is a 26 y.o. female G1P0 at 2957w0d who presents for presents for a two-day history of leaking fluid. She states she was taking a bath and some fluid (unsure of color, but states it was not bloody) began leaking. She is unsure whether she is leaking urine or amniotic fluid.  She has had other leaking since that time.  She notes +FM, no vaginal bleeding.  She denies contractions.  She has some mild urinary symptoms. She was seen by me 3 days ago with nausea/vomiting, but states that has completely resolved.  Denies vaginal itching, burning, and irritation.    Hospital Course: Admitted for the above.  Rupture of membranes workup negative.  UA normal. Wet prep showed +clue cells suggestive of BV. GC/Chlamydia NAAT negative. Patient discharged home in stable condition.  Cervix closed.    Past Medical History:  Diagnosis Date  . Asthma   . Endometriosis   . Endometriosis   . Hidradenitis   . Hydronephrosis   . Hypertension   . IBS (irritable bowel syndrome)   . Migraine   . Migraine   . Sciatica   . Scoliosis     Past Surgical History:  Procedure Laterality Date  . COLONOSCOPY  2001;2008  . 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 LAPAROSCOPY  04/2007  . SPINAL FUSION    . SPINAL FUSION  2008  . TONSILLECTOMY      No current facility-administered medications on file prior to encounter.    Current Outpatient Prescriptions on File Prior to  Encounter  Medication Sig Dispense Refill  . cyclobenzaprine (FLEXERIL) 10 MG tablet Take 1 tablet (10 mg total) by mouth every 8 (eight) hours as needed for muscle spasms. 30 tablet 0  . lidocaine (LIDODERM) 5 % Place 1 patch onto the skin daily. Remove & Discard patch within 12 hours or as directed by MD 5 patch 0  . ondansetron (ZOFRAN ODT) 4 MG disintegrating tablet Take 1 tablet (4 mg total) by mouth every 8 (eight) hours as needed for nausea or vomiting. 20 tablet 0  . Prenatal Vit-Fe Fumarate-FA (MULTIVITAMIN-PRENATAL) 27-0.8 MG TABS tablet Take 1 tablet by mouth daily at 12 noon.    . Doxylamine-Pyridoxine (DICLEGIS) 10-10 MG TBEC Take 2 tablets by mouth at bedtime. If symptoms persist, add one tablet in the morning and one in the afternoon (Patient not taking: Reported on 07/16/2016) 120 tablet 2  . NUTRITIONAL SUPPLEMENT LIQD Take 8 oz by mouth 2 (two) times daily. 480 mL 12    Allergies  Allergen Reactions  . Amoxicillin Rash  . Cefaclor Rash  . Cefadroxil Rash  . Cefuroxime Rash  . Cephalexin Rash  . Clarithromycin Rash  . Clindamycin Anaphylaxis  . Doxycycline Swelling    Optic pressure_fluid swelling  . Erythromycin Rash  . Penicillins Rash  . Sulfamethoxazole-Trimethoprim Rash  . Vitamin A     Other reaction(s): Other (See Comments) Causes pressure in brain to go up and she has to have lumbar  puncture to draw fluid off.  . Dextromethorphan Hbr Rash and Swelling  . Morphine Itching  . Other     Other reaction(s): Other (See Comments) "Pretty much every antibiotic there is" "Pretty much every antibiotic there is"   . Azithromycin Rash  . Red Dye Rash    Social History   Social History  . Marital status: Single    Spouse name: N/A  . Number of children: N/A  . Years of education: N/A   Occupational History  . Not on file.   Social History Main Topics  . Smoking status: Current Some Day Smoker    Packs/day: 0.50    Types: Cigarettes  . Smokeless tobacco:  Never Used  . Alcohol use No  . Drug use: No  . Sexual activity: Yes   Other Topics Concern  . Not on file   Social History Narrative  . No narrative on file    Physical Exam: BP 137/69 (BP Location: Left Arm)   Pulse 94   Temp 98.6 F (37 C) (Oral)   Resp 16   LMP 01/23/2016 (Exact Date)   Physical Exam  Constitutional: She is oriented to person, place, and time. She appears well-developed and well-nourished. No distress.  HENT:  Head: Normocephalic and atraumatic.  Cardiovascular: Normal rate and regular rhythm.   Pulmonary/Chest: Effort normal and breath sounds normal. No respiratory distress.  Abdominal:  Gravid, NT  Genitourinary: Vagina normal. Pelvic exam was performed with patient supine. There is no rash, tenderness, lesion or injury on the right labia. There is no rash, tenderness, lesion or injury on the left labia. No erythema, tenderness or bleeding in the vagina. No signs of injury around the vagina. No vaginal discharge found.  Genitourinary Comments: Cervix: closed, thick, and high.   Negative pooling.  Musculoskeletal: Normal range of motion. She exhibits no edema.  Neurological: She is alert and oriented to person, place, and time. No cranial nerve deficit.  Skin: Skin is warm and dry. No rash noted. No erythema.  Psychiatric: She has a normal mood and affect. Her behavior is normal. Judgment normal.    Female chaperone present during pelvic exam.   Consults: None  Significant Findings/ Diagnostic Studies:  Lab Results  Component Value Date   APPEARANCEUR CLEAR (A) 07/17/2016   GLUCOSEU NEGATIVE 07/17/2016   BILIRUBINUR NEGATIVE 07/17/2016   KETONESUR NEGATIVE 07/17/2016   LABSPEC 1.008 07/17/2016   HGBUR NEGATIVE 07/17/2016   PHURINE 7.0 07/17/2016   NITRITE NEGATIVE 07/17/2016   LEUKOCYTESUR NEGATIVE 07/17/2016   RBCU 0-5 07/17/2016   WBCU 0-5 07/17/2016   BACTERIA NONE SEEN 07/17/2016   EPIU 0-5 (A) 07/17/2016   MUCOUSUACOMP PRESENT  06/14/2016   CAOXCRYSTALS PRESENT 06/05/2016    Lab Results  Component Value Date   TRICHWETPREP NONE SEEN 07/17/2016   CLUECELLS PRESENT (A) 07/17/2016   WBCWETPREP FEW (A) 07/17/2016   YEASTWETPREP NONE SEEN 07/17/2016    Lab Results  Component Value Date   CHLAMYDIA NOT DETECTED 07/17/2016   NGONORRHOEAE NOT DETECTED 07/17/2016     Procedures:  Fetal heart rate: 150  Discharge Condition: stable  Disposition: 01-Home or Self Care  Diet: Regular diet  Discharge Activity: Activity as tolerated   Allergies as of 07/17/2016      Reactions   Amoxicillin Rash   Cefaclor Rash   Cefadroxil Rash   Cefuroxime Rash   Cephalexin Rash   Clarithromycin Rash   Clindamycin Anaphylaxis   Doxycycline Swelling   Optic pressure_fluid swelling  Erythromycin Rash   Penicillins Rash   Sulfamethoxazole-trimethoprim Rash   Vitamin A    Other reaction(s): Other (See Comments) Causes pressure in brain to go up and she has to have lumbar puncture to draw fluid off.   Dextromethorphan Hbr Rash, Swelling   Morphine Itching   Other    Other reaction(s): Other (See Comments) "Pretty much every antibiotic there is" "Pretty much every antibiotic there is"   Azithromycin Rash   Red Dye Rash      Medication List    TAKE these medications   cyclobenzaprine 10 MG tablet Commonly known as:  FLEXERIL Take 1 tablet (10 mg total) by mouth every 8 (eight) hours as needed for muscle spasms.   Doxylamine-Pyridoxine 10-10 MG Tbec Commonly known as:  DICLEGIS Take 2 tablets by mouth at bedtime. If symptoms persist, add one tablet in the morning and one in the afternoon   lidocaine 5 % Commonly known as:  LIDODERM Place 1 patch onto the skin daily. Remove & Discard patch within 12 hours or as directed by MD   metroNIDAZOLE 500 MG tablet Commonly known as:  FLAGYL Take 1 tablet (500 mg total) by mouth 2 (two) times daily.   multivitamin-prenatal 27-0.8 MG Tabs tablet Take 1 tablet by  mouth daily at 12 noon.   NUTRITIONAL SUPPLEMENT Liqd Take 8 oz by mouth 2 (two) times daily.   ondansetron 4 MG disintegrating tablet Commonly known as:  ZOFRAN ODT Take 1 tablet (4 mg total) by mouth every 8 (eight) hours as needed for nausea or vomiting.       Total time spent taking care of this patient: 30 minutes  Signed: Thomasene Mohair, MD  07/17/2016, 4:30 AM

## 2016-07-17 NOTE — Discharge Instructions (Signed)
Take flagyl as prescribed!  Get plenty of rest and stay well hydrated

## 2016-08-04 ENCOUNTER — Telehealth: Payer: Self-pay

## 2016-08-04 NOTE — Telephone Encounter (Signed)
Pt calling to request rx for heartburn as tums is not working.  She is 27wks.  She is throwing up acid.  [phone] is Mom's cell - it's okay to lm c mom.

## 2016-08-04 NOTE — Telephone Encounter (Signed)
Please call patient and advise Zantac (ranitidine) 150 mg by mouth twice daily, as needed.  This can be purchased quite cheaply over the counter.

## 2016-08-04 NOTE — Telephone Encounter (Signed)
Please advise 

## 2016-08-05 NOTE — Telephone Encounter (Signed)
Message left on number given advising patient to take Zantac bid as advised by SDJ. KJ CMA

## 2016-08-11 ENCOUNTER — Other Ambulatory Visit: Payer: Self-pay | Admitting: Obstetrics and Gynecology

## 2016-08-11 DIAGNOSIS — O0992 Supervision of high risk pregnancy, unspecified, second trimester: Secondary | ICD-10-CM

## 2016-08-12 ENCOUNTER — Other Ambulatory Visit: Payer: Self-pay | Admitting: Obstetrics and Gynecology

## 2016-08-12 ENCOUNTER — Inpatient Hospital Stay
Admission: EM | Admit: 2016-08-12 | Discharge: 2016-08-12 | Disposition: A | Discharge status: Home / Self Care | Payer: 59 | Attending: Obstetrics and Gynecology | Admitting: Obstetrics and Gynecology

## 2016-08-12 ENCOUNTER — Encounter: Payer: Self-pay | Admitting: *Deleted

## 2016-08-12 ENCOUNTER — Ambulatory Visit (INDEPENDENT_AMBULATORY_CARE_PROVIDER_SITE_OTHER): Payer: 59

## 2016-08-12 DIAGNOSIS — Z3A28 28 weeks gestation of pregnancy: Secondary | ICD-10-CM | POA: Insufficient documentation

## 2016-08-12 DIAGNOSIS — O26899 Other specified pregnancy related conditions, unspecified trimester: Secondary | ICD-10-CM

## 2016-08-12 DIAGNOSIS — O0992 Supervision of high risk pregnancy, unspecified, second trimester: Secondary | ICD-10-CM

## 2016-08-12 DIAGNOSIS — O365930 Maternal care for other known or suspected poor fetal growth, third trimester, not applicable or unspecified: Secondary | ICD-10-CM | POA: Insufficient documentation

## 2016-08-12 DIAGNOSIS — O365990 Maternal care for other known or suspected poor fetal growth, unspecified trimester, not applicable or unspecified: Secondary | ICD-10-CM

## 2016-08-12 DIAGNOSIS — O99212 Obesity complicating pregnancy, second trimester: Secondary | ICD-10-CM

## 2016-08-12 DIAGNOSIS — R109 Unspecified abdominal pain: Secondary | ICD-10-CM

## 2016-08-12 HISTORY — DX: Benign intracranial hypertension: G93.2

## 2016-08-12 NOTE — OB Triage Note (Signed)
Recvd to OBS 4 from Perinatal office with c/o abnormal u/s results requiring prolonged fetal monitoring. Changed to gown and to bed.  EFM applied.  Oriented to room and plan of care discussed.  Verbalized understanding, agrees with plan.

## 2016-08-12 NOTE — Discharge Instructions (Signed)
Intrauterine Growth Restriction Intrauterine growth restriction (IUGR) is when your baby is not growing normally during your pregnancy. A baby with IUGR is smaller than it should be and may weigh less than normal at birth. IUGR can result if there is a problem with the organ that supplies your baby with oxygen and nutrition (placenta). Usually, there is no way to prevent this type of problem. What are the causes? The most common cause of IUGR is a problem with the placenta or umbilical cord that causes your developing baby to get less oxygen or nutrition than needed. Other causes include:  Poor maternal nutrition.  Chemicals found in substances such as cigarettes, alcohol, and some illegal drugs.  Some prescription medicines.  Congenital defects.  Genetic disorders.  An infection.  Carrying more than one baby, such as twins or triplets (multiple gestations).  What increases the risk? This condition is more likely to develop in women who:  Are over the age of 35 at the time of delivery (advanced maternal age).  Have medical conditions such as high blood pressure, diabetes, heart or kidney disease, anemia, or conditions that increase the risk for blood clotting.  Live at a very high altitude during pregnancy.  Have a personal history or family history of IUGR.  Take medicines during pregnancy that are linked with congenital disabilities.  Have a personal or family history of a genetic disorder.  Come into contact with infected cat feces (toxoplasmosis).  Come into contact with chickenpox (varicella) or German measles (rubella).  Have or are at risk of getting an infectious disease such as syphilis, HIV, or herpes.  Eat poorly during their pregnancy.  Weigh less than 100 pounds.  Have a family history of multiple gestations.  Have had infertility treatments.  Use tobacco, illegal drugs, or drink alcohol during pregnancy.  What are the signs or symptoms? IUGR does not  cause many symptoms. You might notice that your baby does not move or kick very often. Also, your belly may not be as big as expected for the stage of your pregnancy. How is this diagnosed? This condition is diagnosed with physical and prenatal exams. Your health care provider will measure the size of your baby inside your womb during a routine screening exam using sound waves (ultrasound). Your health care provider will compare the size of your baby to the size of other babies at the same stage of development (gestational age). Your health care provider will diagnose IUGR if your baby is smaller than 90 percent of all other babies at the same gestational age. You may also have tests to find the cause of IUGR. These can include:  Having fluid removed from your womb to check for signs of infection or a congenital disability (amniocentesis).  A series of tests to monitor your baby's health (fetal monitoring).  How is this treated? In most cases, treatment for this condition focuses on stopping the cause of your baby's small growth. Your health care providers will monitor your pregnancy closely and help you manage your pregnancy. If this condition is caused by a placenta problem and the baby is not getting enough blood, treatment may include:  Medicine to start labor and deliver your baby early (induction).  Cesarean delivery. In this procedure, your baby is delivered through a cut (incision) in the abdomen and womb (uterus).  Follow these instructions at home:  Make sure you are eating enough calories and gaining enough weight.  Eat a balanced diet. Work with a nutrition specialist (  dietitian), if needed.  Rest as needed. Try to get at least eight hours of sleep every night.  Do not drink alcohol.  Do not use illegal drugs.  Do not use any tobacco products, including cigarettes, chewing tobacco, or e-cigarettes. If you need help quitting, ask your health care provider.  Talk to your  health care provider about steps you can take to avoid infections.  Take medicines, vitamins, and mineral supplements only as directed by your health care provider. Make sure that your health care provider knows about all of the prescribed or over-the-counter medicines, supplements, vitamins, eye drops, and creams that you are using.  Keep all follow-up visits as directed by your health care provider. This is important. Contact a health care provider if:  Your baby is not moving as often as before. This information is not intended to replace advice given to you by your health care provider. Make sure you discuss any questions you have with your health care provider. Document Released: 11/27/2007 Document Revised: 07/26/2015 Document Reviewed: 02/13/2014 Elsevier Interactive Patient Education  2018 Elsevier Inc[employer]  

## 2016-08-12 NOTE — Final Progress Note (Signed)
Physician Final Progress Note  Patient ID: Courtney Sanchez MRN: [id number] DOB/AGE: 24/11/1990 25 y.o.  Admit date: 08/12/2016 Admitting provider: Conard NovakStephen D Jackson, MD/ Gasper Lloydolleen L. Sharen HonesGutierrez, CNM Discharge date: 08/12/2016   Admission Diagnoses: Fetal growth restriction and elevated Dopplers at 28 weeks 5day gestation  Discharge Diagnoses:  Same Reactive Non stress test Cat 1 tracing. Consults: None  Significant Findings/ Diagnostic Studies: 26 year old G1 P0 with EDC=10/29/2016 by LMP (6wk ultrasound agrees- EDC transcribed incorrectly in record) presents from office at 28weeks 6 days for extended monitoring. She had an ultrasound done today for obesity revealing asymmetrical fetal growth restriction: BPD was in the 33.4%, but AC 8.1% and FL 4.2%. Dopplers were elevated (5.86). EFW 1072 grams (30.5%). AFI: 13.34cm.  [facility] by obesity with BMI >40 (early glucola WNL, has lost 3# with pregnancy), migraine, smoking, and sciatica. History is remarkable for scoliosis ( had spinal surgery), partial nephrectomy, endometriosis, and pseudotumor cerebri.  Fetal heart tracing was reactive with baseline 145-150 and accelerations to 160s to 170, moderate variability. No contractions seen. After 2 hours of monitoring, patient was discharged home.  Discussed fetal growth restriction/ risks to fetus and encouraged stopping smoking and getting more protein in diet.  [facility] ultrasound/ Dopplers and MFM consult. To keep appointment tomorrow with Dr Jean RosenthalJackson and [facility] 28 week [facility] given  Procedures: non stress test  Discharge Condition: stable  Disposition: 01-Home or Self Care  Diet: Regular diet  Discharge Activity: Activity as tolerated  Discharge Instructions    Discharge patient    Complete by:  As directed    Discharge disposition:  01-Home or Self Care   Discharge patient date:  08/12/2016      Allergies as of 08/12/2016      Reactions   Amoxicillin Rash   Cefaclor Rash   Cefadroxil Rash   Cefuroxime Rash   Cephalexin Rash   Clarithromycin Rash   Clindamycin Anaphylaxis   Doxycycline Swelling   Optic pressure_fluid swelling   Erythromycin Rash   Penicillins Rash   Sulfamethoxazole-trimethoprim Rash   Vitamin A    Other reaction(s): Other (See Comments) Causes pressure in brain to go up and she has to have lumbar puncture to draw fluid off.   Dextromethorphan Hbr Rash, Swelling   Morphine Itching   Other    Other reaction(s): Other (See Comments) "Pretty much every antibiotic there is" "Pretty much every antibiotic there is"   Azithromycin Rash   Red Dye Rash      Medication List    TAKE these medications   cyclobenzaprine 10 MG tablet Commonly known as:  FLEXERIL Take 1 tablet (10 mg total) by mouth every 8 (eight) hours as needed for muscle spasms.   lidocaine 5 % Commonly known as:  LIDODERM Place 1 patch onto the skin daily. Remove & Discard patch within 12 hours or as directed by MD   multivitamin-prenatal 27-0.8 MG Tabs tablet Take 1 tablet by mouth daily at 12 noon.   NUTRITIONAL SUPPLEMENT Liqd Take 8 oz by mouth 2 (two) times daily.        Total time spent taking care of this patient: 15-20 minutes  Signed: Farrel Connersolleen [place] Welcome 08/12/2016, 4:53 PM

## 2016-08-13 ENCOUNTER — Ambulatory Visit (INDEPENDENT_AMBULATORY_CARE_PROVIDER_SITE_OTHER): Payer: 59 | Admitting: Obstetrics and Gynecology

## 2016-08-13 ENCOUNTER — Other Ambulatory Visit: Payer: 59

## 2016-08-13 ENCOUNTER — Other Ambulatory Visit: Payer: Self-pay | Admitting: Certified Nurse Midwife

## 2016-08-13 VITALS — BP 124/80 | Wt 245.0 lb

## 2016-08-13 DIAGNOSIS — O365990 Maternal care for other known or suspected poor fetal growth, unspecified trimester, not applicable or unspecified: Secondary | ICD-10-CM

## 2016-08-13 DIAGNOSIS — O0993 Supervision of high risk pregnancy, unspecified, third trimester: Secondary | ICD-10-CM

## 2016-08-13 DIAGNOSIS — Z6841 Body Mass Index (BMI) 40.0 and over, adult: Secondary | ICD-10-CM

## 2016-08-13 DIAGNOSIS — O0992 Supervision of high risk pregnancy, unspecified, second trimester: Secondary | ICD-10-CM

## 2016-08-13 DIAGNOSIS — O99213 Obesity complicating pregnancy, third trimester: Secondary | ICD-10-CM

## 2016-08-13 DIAGNOSIS — Z131 Encounter for screening for diabetes mellitus: Secondary | ICD-10-CM

## 2016-08-13 DIAGNOSIS — Z113 Encounter for screening for infections with a predominantly sexual mode of transmission: Secondary | ICD-10-CM

## 2016-08-13 DIAGNOSIS — Z3A29 29 weeks gestation of pregnancy: Secondary | ICD-10-CM

## 2016-08-13 NOTE — Progress Notes (Signed)
28 week labs today. No vb. No lof.  Growth u/s yesterday 31st %ile, but AC and FL <10th %ile.  S/D > 5.0.   Monitored on L&D yesterday. Very reassuring fetal tracing. Initially elevated BPs, but normalized with normal labs. Still very concerning with possible GHTN and asymmetric fetal growth and elevated S/D ratio.   She has an appt with [facility] in 5 days.   Discussed all findings with pt. Will plan for f/u u/s with UA dopps in 2 weeks (assume she is getting a repeat of UA dopps by [facility] in 5 days). Discussed high-risk nature of these findings.  ?transfer of care, per [facility] recommendations.

## 2016-08-14 ENCOUNTER — Telehealth: Payer: Self-pay | Admitting: Obstetrics and Gynecology

## 2016-08-14 ENCOUNTER — Other Ambulatory Visit: Payer: Self-pay | Admitting: *Deleted

## 2016-08-14 DIAGNOSIS — O365921 Maternal care for other known or suspected poor fetal growth, second trimester, fetus 1: Secondary | ICD-10-CM

## 2016-08-14 LAB — 28 WEEK RH+PANEL
BASOS ABS: 0 10*3/uL (ref 0.0–0.2)
Basos: 0 %
EOS (ABSOLUTE): 0.2 10*3/uL (ref 0.0–0.4)
Eos: 2 %
Gestational Diabetes Screen: 143 mg/dL — ABNORMAL HIGH (ref 65–139)
HEMATOCRIT: 33.6 % — AB (ref 34.0–46.6)
HEMOGLOBIN: 11 g/dL — AB (ref 11.1–15.9)
HIV SCREEN 4TH GENERATION: NONREACTIVE
Immature Grans (Abs): 0.2 10*3/uL — ABNORMAL HIGH (ref 0.0–0.1)
Immature Granulocytes: 1 %
LYMPHS ABS: 2.3 10*3/uL (ref 0.7–3.1)
Lymphs: 15 %
MCH: 30.5 pg (ref 26.6–33.0)
MCHC: 32.7 g/dL (ref 31.5–35.7)
MCV: 93 fL (ref 79–97)
MONOCYTES: 5 %
Monocytes Absolute: 0.7 10*3/uL (ref 0.1–0.9)
NEUTROS ABS: 11.5 10*3/uL — AB (ref 1.4–7.0)
Neutrophils: 77 %
PLATELETS: 272 10*3/uL (ref 150–379)
RBC: 3.61 x10E6/uL — ABNORMAL LOW (ref 3.77–5.28)
RDW: 14.4 % (ref 12.3–15.4)
RPR: NONREACTIVE
WBC: 14.9 10*3/uL — AB (ref 3.4–10.8)

## 2016-08-14 NOTE — Telephone Encounter (Signed)
Patient called and was seen on 6/13. She needs a note saying that she was here.  Please advise pt.  [phone]

## 2016-08-15 NOTE — Telephone Encounter (Signed)
Note written and left up front 

## 2016-08-18 ENCOUNTER — Ambulatory Visit
Admission: RE | Admit: 2016-08-18 | Discharge: 2016-08-18 | Disposition: A | Discharge status: Home / Self Care | Payer: 59 | Source: Ambulatory Visit | Attending: Maternal and Fetal Medicine | Admitting: Maternal and Fetal Medicine

## 2016-08-18 ENCOUNTER — Other Ambulatory Visit: Payer: Self-pay | Admitting: Maternal and Fetal Medicine

## 2016-08-18 ENCOUNTER — Encounter: Payer: Self-pay | Admitting: Obstetrics and Gynecology

## 2016-08-18 DIAGNOSIS — O99213 Obesity complicating pregnancy, third trimester: Secondary | ICD-10-CM

## 2016-08-18 DIAGNOSIS — O365921 Maternal care for other known or suspected poor fetal growth, second trimester, fetus 1: Secondary | ICD-10-CM

## 2016-08-18 DIAGNOSIS — Z3A29 29 weeks gestation of pregnancy: Secondary | ICD-10-CM | POA: Diagnosis not present

## 2016-08-18 DIAGNOSIS — O0993 Supervision of high risk pregnancy, unspecified, third trimester: Secondary | ICD-10-CM

## 2016-08-18 DIAGNOSIS — O26899 Other specified pregnancy related conditions, unspecified trimester: Secondary | ICD-10-CM

## 2016-08-18 DIAGNOSIS — O365931 Maternal care for other known or suspected poor fetal growth, third trimester, fetus 1: Secondary | ICD-10-CM | POA: Insufficient documentation

## 2016-08-18 DIAGNOSIS — R109 Unspecified abdominal pain: Secondary | ICD-10-CM

## 2016-08-19 ENCOUNTER — Telehealth: Payer: Self-pay | Admitting: Obstetrics & Gynecology

## 2016-08-19 DIAGNOSIS — R7309 Other abnormal glucose: Secondary | ICD-10-CM

## 2016-08-19 NOTE — Telephone Encounter (Signed)
-----   Message from Liliane ShiBeverly M Shaw, [place] sent at 08/19/2016  8:12 AM EDT ----- Please call pt and get 3 hr gtt scheduled. Pt aware of results.  ----- Message ----- From: Conard NovakJackson, Stephen D, MD Sent: 08/18/2016   5:46 PM To: Liliane ShiBeverly M Shaw, CMA  Please call patient to let her know about her result and to help her schedule a 3 hour glucose tolerance test.

## 2016-08-19 NOTE — Telephone Encounter (Signed)
Pt is schedule 08/28/16 for lab

## 2016-08-26 NOTE — Addendum Note (Signed)
Addended by: Kathlene CoteSTANLEY, Gerrick Ray G on: 08/26/2016 10:24 AM   Modules accepted: Orders

## 2016-08-28 ENCOUNTER — Other Ambulatory Visit: Payer: 59

## 2016-08-28 ENCOUNTER — Ambulatory Visit (INDEPENDENT_AMBULATORY_CARE_PROVIDER_SITE_OTHER): Payer: 59

## 2016-08-28 ENCOUNTER — Ambulatory Visit (INDEPENDENT_AMBULATORY_CARE_PROVIDER_SITE_OTHER): Payer: 59 | Admitting: Advanced Practice Midwife

## 2016-08-28 VITALS — BP 128/74 | Wt 244.0 lb

## 2016-08-28 DIAGNOSIS — O365990 Maternal care for other known or suspected poor fetal growth, unspecified trimester, not applicable or unspecified: Secondary | ICD-10-CM | POA: Diagnosis not present

## 2016-08-28 DIAGNOSIS — O0993 Supervision of high risk pregnancy, unspecified, third trimester: Secondary | ICD-10-CM

## 2016-08-28 DIAGNOSIS — Z3A31 31 weeks gestation of pregnancy: Secondary | ICD-10-CM

## 2016-08-28 NOTE — Progress Notes (Signed)
UA dopplers wnl today and AFI 16.14. Patient says no concerns from [facility] at recent visit. C/o pain in left calf especially at the end of the day. Mild swelling. No redness, warmth or focused area of swelling. Growth scan and increased fetal surveillance at 36 weeks.

## 2016-08-28 NOTE — Progress Notes (Signed)
U/s today. No vb. No lof.  

## 2016-08-29 ENCOUNTER — Other Ambulatory Visit: Payer: 59

## 2016-08-29 ENCOUNTER — Encounter: Payer: 59 | Admitting: Advanced Practice Midwife

## 2016-08-30 ENCOUNTER — Observation Stay
Admission: EM | Admit: 2016-08-30 | Discharge: 2016-08-30 | Disposition: A | Discharge status: Home / Self Care | Payer: 59 | Attending: Obstetrics and Gynecology | Admitting: Obstetrics and Gynecology

## 2016-08-30 DIAGNOSIS — O4703 False labor before 37 completed weeks of gestation, third trimester: Principal | ICD-10-CM | POA: Insufficient documentation

## 2016-08-30 DIAGNOSIS — M5432 Sciatica, left side: Secondary | ICD-10-CM

## 2016-08-30 DIAGNOSIS — Z3A31 31 weeks gestation of pregnancy: Secondary | ICD-10-CM | POA: Insufficient documentation

## 2016-08-30 LAB — URINALYSIS, COMPLETE (UACMP) WITH MICROSCOPIC
BILIRUBIN URINE: NEGATIVE
GLUCOSE, UA: NEGATIVE mg/dL
HGB URINE DIPSTICK: NEGATIVE
KETONES UR: NEGATIVE mg/dL
NITRITE: NEGATIVE
PH: 7 (ref 5.0–8.0)
Protein, ur: NEGATIVE mg/dL
SPECIFIC GRAVITY, URINE: 1.009 (ref 1.005–1.030)

## 2016-08-30 LAB — GLUCOSE, CAPILLARY: Glucose-Capillary: 92 mg/dL (ref 65–99)

## 2016-08-30 MED ORDER — ACETAMINOPHEN 325 MG PO TABS
650.0000 mg | ORAL_TABLET | ORAL | Status: DC | PRN
  Administered 2016-08-30: 650 mg via ORAL
  Filled 2016-08-30: qty 2

## 2016-08-30 MED ORDER — CYCLOBENZAPRINE HCL 10 MG PO TABS: 10.0000 mg | ORAL_TABLET | Freq: Three times a day (TID) | ORAL | 0 refills | Status: DC | PRN

## 2016-08-30 NOTE — Final Progress Note (Signed)
Physician Final Progress Note  Patient ID: Courtney Sanchez MRN: [id number] DOB/AGE: 24/11/1990 25 y.o.  Admit date: 08/30/2016 Admitting provider: Vena AustriaAndreas Cameren Odwyer, MD Discharge date: 08/30/2016   Admission Diagnoses: Abdominal pain  Discharge Diagnoses: Discomforts of pregnancy  26 yo G1P0 at 865w3d presenting with possible contractions.  No LOF, no VB, +FM.  Also increased discharge.  Nitrazine negative, cervix closed, no contractions.  Discussed supportive measures, refill flexeril.  Discussed need for anesthesia consult given prior spine surgery as well as importance of doing 3-hr did not do on 6/28 visit and was not re-ordered.  Spot BG here 92.  Consults: None  Significant Findings/ Diagnostic Studies: none  Procedures:  Baseline: 130 Variability: moderate Accelerations: present Decelerations: absent Tocometry: none The patient was monitored for 30 minutes, fetal heart rate tracing was deemed reactive, category I tracing,   Discharge Condition: good  Disposition: 01-Home or Self Care  Diet: Regular diet  Discharge Activity: Activity as tolerated  Discharge Instructions    Discharge activity:  No Restrictions    Complete by:  As directed    Discharge diet:  No restrictions    Complete by:  As directed    No sexual activity restrictions    Complete by:  As directed    Notify physician for a general feeling that "something is not right"    Complete by:  As directed    Notify physician for increase or change in vaginal discharge    Complete by:  As directed    Notify physician for intestinal cramps, with or without diarrhea, sometimes described as "gas pain"    Complete by:  As directed    Notify physician for leaking of fluid    Complete by:  As directed    Notify physician for low, dull backache, unrelieved by heat or Tylenol    Complete by:  As directed    Notify physician for menstrual like cramps    Complete by:  As directed    Notify physician for  pelvic pressure    Complete by:  As directed    Notify physician for uterine contractions.  These may be painless and feel like the uterus is tightening or the baby is  "balling up"    Complete by:  As directed    Notify physician for vaginal bleeding    Complete by:  As directed    PRETERM LABOR:  Includes any of the follwing symptoms that occur between 20 - [redacted] weeks gestation.  If these symptoms are not stopped, preterm labor can result in preterm delivery, placing your baby at risk    Complete by:  As directed      Allergies as of 08/30/2016      Reactions   Amoxicillin Rash   Cefaclor Rash   Cefadroxil Rash   Cefuroxime Rash   Cephalexin Rash   Clarithromycin Rash   Clindamycin Anaphylaxis   Doxycycline Swelling   Optic pressure_fluid swelling   Erythromycin Rash   Penicillins Rash   Sulfamethoxazole-trimethoprim Rash   Vitamin A    Other reaction(s): Other (See Comments) Causes pressure in brain to go up and she has to have lumbar puncture to draw fluid off.   Dextromethorphan Hbr Rash, Swelling   Morphine Itching   Other    Other reaction(s): Other (See Comments) "Pretty much every antibiotic there is" "Pretty much every antibiotic there is"   Azithromycin Rash   Red Dye Rash      Medication List    STOP  taking these medications   lidocaine 5 % Commonly known as:  LIDODERM   nitrofurantoin (macrocrystal-monohydrate) 100 MG capsule Commonly known as:  MACROBID   NUTRITIONAL SUPPLEMENT Liqd     TAKE these medications   cyclobenzaprine 10 MG tablet Commonly known as:  FLEXERIL Take 1 tablet (10 mg total) by mouth every 8 (eight) hours as needed for muscle spasms.   multivitamin-prenatal 27-0.8 MG Tabs tablet Take 1 tablet by mouth daily at 12 noon.   ondansetron 4 MG disintegrating tablet Commonly known as:  ZOFRAN-ODT        Total time spent taking care of this patient: 30 minutes  Signed: Vena Austria 08/30/2016, 3:07 AM

## 2016-08-30 NOTE — Discharge Summary (Signed)
See final progress note. 

## 2016-08-30 NOTE — OB Triage Note (Signed)
Patient came in for observation for lower abdominal pain and ;eaking of fluid since last night at 2130. Patient rates abdominal pain 8 out of 10. Patient describes the pain as spasms that last about 10 seconds. Patient reports leaking of thin clear fluid when going to the bathroom since last night at 2130 as well. Patient states she has been feeeling the baby move fine and no other complaints. Patient states she has not had any water to drink today. Patient complains of leaking of fluid but denies vaginal bleeding and spotting. Vital signs stable and patient afebrile. FHR baseline 145with moderate variability with accelerations 15 x 15 and no decelerations. Mother and significant other at bedside with patient. Will continue to monitor.

## 2016-08-30 NOTE — Discharge Summary (Signed)
Patient discharged with instructions on follow up appointments, labor precautions, new oral prescription, oral hydration by drinking water, and when to seek medical attention. Patient verbalized understanding of discharge instructions. Patient ambulatory at discharge with steady gait and no complaints. Patient discharged with significant other and mother.

## 2016-09-01 ENCOUNTER — Telehealth: Payer: Self-pay | Admitting: Obstetrics and Gynecology

## 2016-09-01 NOTE — Telephone Encounter (Signed)
Called and left voicemail for patient to report to L&D

## 2016-09-01 NOTE — Telephone Encounter (Signed)
Pt is schedule 09/12/16

## 2016-09-01 NOTE — Telephone Encounter (Signed)
Pt needs to be seen please schedule

## 2016-09-01 NOTE — Telephone Encounter (Signed)
Due to no cancellations at this time please advise if patient needs to be overbooked

## 2016-09-01 NOTE — Telephone Encounter (Signed)
-----   Message from Vena AustriaAndreas Staebler, MD sent at 08/30/2016  3:10 AM EDT ----- Regarding: Needs her 3-hr re-scheduled Patient did not complete 3-hr at 6/28 visit and needs to have rescheduled in the next week

## 2016-09-01 NOTE — Telephone Encounter (Signed)
Go to L&D

## 2016-09-01 NOTE — Telephone Encounter (Signed)
Pt is calling about needing an follow up with Provider. Pt was seen at [facility]  On Friday 08/29/16. Pt report has the feeling a leaking and some vaginal itching. Please advise

## 2016-09-04 ENCOUNTER — Other Ambulatory Visit: Payer: Self-pay | Admitting: *Deleted

## 2016-09-04 DIAGNOSIS — O99213 Obesity complicating pregnancy, third trimester: Secondary | ICD-10-CM

## 2016-09-11 ENCOUNTER — Encounter: Payer: 59 | Admitting: Advanced Practice Midwife

## 2016-09-11 ENCOUNTER — Ambulatory Visit
Admission: RE | Admit: 2016-09-11 | Discharge: 2016-09-11 | Disposition: A | Discharge status: Home / Self Care | Payer: 59 | Source: Ambulatory Visit | Attending: Obstetrics and Gynecology | Admitting: Obstetrics and Gynecology

## 2016-09-11 DIAGNOSIS — O99210 Obesity complicating pregnancy, unspecified trimester: Secondary | ICD-10-CM

## 2016-09-11 DIAGNOSIS — Z3A33 33 weeks gestation of pregnancy: Secondary | ICD-10-CM | POA: Diagnosis not present

## 2016-09-11 DIAGNOSIS — Z6841 Body Mass Index (BMI) 40.0 and over, adult: Secondary | ICD-10-CM | POA: Insufficient documentation

## 2016-09-11 DIAGNOSIS — O99213 Obesity complicating pregnancy, third trimester: Secondary | ICD-10-CM

## 2016-09-12 ENCOUNTER — Ambulatory Visit (INDEPENDENT_AMBULATORY_CARE_PROVIDER_SITE_OTHER): Payer: 59 | Admitting: Obstetrics and Gynecology

## 2016-09-12 ENCOUNTER — Other Ambulatory Visit: Payer: 59

## 2016-09-12 VITALS — BP 118/78 | Wt 251.0 lb

## 2016-09-12 DIAGNOSIS — O0993 Supervision of high risk pregnancy, unspecified, third trimester: Secondary | ICD-10-CM

## 2016-09-12 DIAGNOSIS — Z981 Arthrodesis status: Secondary | ICD-10-CM

## 2016-09-12 DIAGNOSIS — Z3A33 33 weeks gestation of pregnancy: Secondary | ICD-10-CM

## 2016-09-12 DIAGNOSIS — O99810 Abnormal glucose complicating pregnancy: Secondary | ICD-10-CM

## 2016-09-12 DIAGNOSIS — O99213 Obesity complicating pregnancy, third trimester: Secondary | ICD-10-CM

## 2016-09-12 DIAGNOSIS — Z6841 Body Mass Index (BMI) 40.0 and over, adult: Secondary | ICD-10-CM

## 2016-09-12 DIAGNOSIS — Z905 Acquired absence of kidney: Secondary | ICD-10-CM

## 2016-09-12 DIAGNOSIS — J4520 Mild intermittent asthma, uncomplicated: Secondary | ICD-10-CM

## 2016-09-12 NOTE — Progress Notes (Signed)
Prenatal Visit Note Date: 09/12/2016 Clinic: [facility]  Subjective:  Courtney Sanchez is a 26 y.o. G1P0 at 3744w2d being seen today for ongoing prenatal care.  She is currently monitored for the following issues for this high-risk pregnancy and has History of spinal fusion; BMI 40.0-44.9, adult (HCC); Supervision of high risk pregnancy, antepartum, third trimester; Obesity complicating pregnancy, third trimester; UTI (urinary tract infection); Abdominal pain affecting pregnancy; Pregnancy; H/O partial nephrectomy; Hydronephrosis of right kidney; Idiopathic scoliosis; Pseudotumor cerebri; Acute left-sided low back pain with left-sided sciatica; Intermittent asthma; Migraines; IBS (irritable bowel syndrome); Renal cyst; Pregnancy affected by fetal growth restriction; and Labor and delivery indication for care or intervention on her problem list. She also failed her 1 h gtt. Receiving 3 hour gtt today [facility] U/S yesterday, with normal growth and recommended f/u, growth in 4 weeks. TDaP today  Patient reports no bleeding, no contractions, no cramping and no leaking.   Contractions: Not present. Vag. Bleeding: None.  Movement: Present. Denies leaking of fluid.   The following portions of the patient's history were reviewed and updated as appropriate: allergies, current medications, past family history, past medical history, past social history, past surgical history and problem list. Problem list updated.  Objective:   Vitals:   09/12/16 1021  BP: 118/78  Weight: 251 lb (113.9 kg)    Fetal Status:     Movement: Present     General:  Alert, oriented and cooperative. Patient is in no acute distress.  Skin: Skin is warm and dry. No rash noted.   Cardiovascular: Normal heart rate noted  Respiratory: Normal respiratory effort, no problems with respiration noted  Abdomen: Soft, gravid, appropriate for gestational age. Pain/Pressure: Absent     Pelvic:  Cervical exam deferred         Extremities: Normal range of motion.  Edema: Trace  Mental Status: Normal mood and affect. Normal behavior. Normal judgment and thought content.   Urinalysis:      Assessment and Plan:  Pregnancy: G1P0 at 6244w2d TDaP today 3h gtt today F/U growth u/s in 4 weeks APT starting at 36 weeks due to obesity, BMI >40  Preterm labor symptoms and general obstetric precautions including but not limited to vaginal bleeding, contractions, leaking of fluid and fetal movement were reviewed in detail with the patient. Please refer to After Visit Summary for other counseling recommendations.  Return in about 2 weeks (around 09/26/2016) for Routine Prenatal Appointment.   Thomasene MohairStephen Dmitry Macomber, MD 09/12/2016 11:28 AM

## 2016-09-12 NOTE — Addendum Note (Signed)
Addended by: Liliane ShiSHAW, BEVERLY M on: 09/12/2016 03:17 PM   Modules accepted: Orders

## 2016-09-13 LAB — GESTATIONAL GLUCOSE TOLERANCE
GLUCOSE 1 HOUR GTT: 121 mg/dL (ref 65–179)
GLUCOSE 2 HOUR GTT: 111 mg/dL (ref 65–154)
GLUCOSE 3 HOUR GTT: 76 mg/dL (ref 65–139)
Glucose, Fasting: 76 mg/dL (ref 65–94)

## 2016-09-18 ENCOUNTER — Observation Stay
Admission: EM | Admit: 2016-09-18 | Discharge: 2016-09-18 | Disposition: A | Discharge status: Home / Self Care | Payer: 59 | Attending: Obstetrics and Gynecology | Admitting: Obstetrics and Gynecology

## 2016-09-18 ENCOUNTER — Telehealth: Payer: Self-pay

## 2016-09-18 DIAGNOSIS — Z3A34 34 weeks gestation of pregnancy: Secondary | ICD-10-CM | POA: Insufficient documentation

## 2016-09-18 DIAGNOSIS — R109 Unspecified abdominal pain: Secondary | ICD-10-CM | POA: Diagnosis present

## 2016-09-18 DIAGNOSIS — O4703 False labor before 37 completed weeks of gestation, third trimester: Secondary | ICD-10-CM | POA: Diagnosis not present

## 2016-09-18 DIAGNOSIS — O365990 Maternal care for other known or suspected poor fetal growth, unspecified trimester, not applicable or unspecified: Secondary | ICD-10-CM | POA: Diagnosis present

## 2016-09-18 LAB — COMPREHENSIVE METABOLIC PANEL
ALK PHOS: 114 U/L (ref 38–126)
ALT: 11 U/L — ABNORMAL LOW (ref 14–54)
ANION GAP: 6 (ref 5–15)
AST: 11 U/L — ABNORMAL LOW (ref 15–41)
Albumin: 2.6 g/dL — ABNORMAL LOW (ref 3.5–5.0)
BUN: 6 mg/dL (ref 6–20)
CALCIUM: 8.4 mg/dL — AB (ref 8.9–10.3)
CHLORIDE: 108 mmol/L (ref 101–111)
CO2: 22 mmol/L (ref 22–32)
Creatinine, Ser: 0.58 mg/dL (ref 0.44–1.00)
GFR calc non Af Amer: >60 mL/min (ref >60)
Glucose, Bld: 86 mg/dL (ref 65–99)
POTASSIUM: 3.7 mmol/L (ref 3.5–5.1)
SODIUM: 136 mmol/L (ref 135–145)
Total Bilirubin: 0.4 mg/dL (ref 0.3–1.2)
Total Protein: 5.7 g/dL — ABNORMAL LOW (ref 6.5–8.1)

## 2016-09-18 LAB — CBC
HCT: 30.6 % — ABNORMAL LOW (ref 35.0–47.0)
Hemoglobin: 10.5 g/dL — ABNORMAL LOW (ref 12.0–16.0)
MCH: 30.5 pg (ref 26.0–34.0)
MCHC: 34.4 g/dL (ref 32.0–36.0)
MCV: 88.6 fL (ref 80.0–100.0)
PLATELETS: 221 10*3/uL (ref 150–440)
RBC: 3.45 MIL/uL — ABNORMAL LOW (ref 3.80–5.20)
RDW: 13.3 % (ref 11.5–14.5)
WBC: 14.4 10*3/uL — ABNORMAL HIGH (ref 3.6–11.0)

## 2016-09-18 NOTE — Final Progress Note (Signed)
Physician Final Progress Note  Patient ID: Courtney Sanchez MRN: [id number] DOB/AGE: 24/11/1990 24 y.o.  Admit date: 09/18/2016 Admitting provider: Conard NovakStephen D Jackson, MD Discharge date: 09/18/2016  Admission Diagnoses:  1) intrauterine pregnancy at 4855w1d 2) abdominal pain in pregnancy  Discharge Diagnoses:  1) intrauterine pregnancy at 7555w1d 2) abdominal pain in pregnancy  History of Present Illness: The patient is a 26 y.o. female G1P0 at 2355w1d who presents for abdominal pain.  She got off work at 7 am yesterday. She noticed bilateral hip pain extending across her lower abdomen. The pain is described as constant and is 8/10.  It is achey, cramp-like.  Nothing makes it better or worse.  No associated symptoms. Denies fevers, chills, nausea, vomiting, diarrhea, constipation, vaginal symptoms, urinary symptoms.  She originally presented to [facility] in [place]. The facility does not have a labor and delivery unit. The ER physician was concerned that the patient was dilated.  So, an attempt was made to transfer the patient to [facility].  The patient, however, did not wait until transfer.  She left AMA. She later presented to [facility] with the above history.  At [facility] she had a UA which was negative, a wet prep that was somewhat suggestive of BV.    Hospital Course: The patient was monitored on L&D with no uterine activity. The fetal tracing was reassuring and reactive.  Cervical exam revealed closed cervix.  Patient had CBC and CMP normal.  Repeat exam with mild LLQ pain and no other abnormalities; no n/v/d, no ctxs or VB, good FHTs.  Past Medical History:  Diagnosis Date  . Asthma   . Endometriosis   . Endometriosis   . Hidradenitis   . Hydronephrosis   . IBS (irritable bowel syndrome)   . Migraine   . Migraine   . Pseudotumor cerebri   . Sciatica   . Scoliosis     Past Surgical History:  Procedure Laterality Date  . COLONOSCOPY  2001;2008  . KIDNEY SURGERY Right 2012   per pt peice  of kidney removed hand has only partial function  . LAPAROSCOPY  04/2007  . SPINAL FUSION    . SPINAL FUSION  2008  . SPINAL FUSION    . TONSILLECTOMY      No current facility-administered medications on file prior to encounter.    Current Outpatient Prescriptions on File Prior to Encounter  Medication Sig Dispense Refill  . cyclobenzaprine (FLEXERIL) 10 MG tablet Take 1 tablet (10 mg total) by mouth every 8 (eight) hours as needed for muscle spasms. 30 tablet 0  . Prenatal Vit-Fe Fumarate-FA (MULTIVITAMIN-PRENATAL) 27-0.8 MG TABS tablet Take 1 tablet by mouth daily at 12 noon.    . ranitidine (ZANTAC) 150 MG tablet Take 150 mg by mouth 2 (two) times daily.    . ondansetron (ZOFRAN-ODT) 4 MG disintegrating tablet       Allergies  Allergen Reactions  . Amoxicillin Rash  . Cefaclor Rash  . Cefadroxil Rash  . Cefuroxime Rash  . Cephalexin Rash  . Clarithromycin Rash  . Clindamycin Anaphylaxis  . Doxycycline Swelling    Optic pressure_fluid swelling  . Erythromycin Rash  . Penicillins Rash  . Sulfamethoxazole-Trimethoprim Rash  . Vitamin A     Other reaction(s): Other (See Comments) Causes pressure in brain to go up and she has to have lumbar puncture to draw fluid off.  . Dextromethorphan Hbr Rash and Swelling  . Morphine Itching  . Other     Other reaction(s): Other (  See Comments) "Pretty much every antibiotic there is" "Pretty much every antibiotic there is"   . Azithromycin Rash  . Red Dye Rash    Social History   Social History  . Marital status: Single    Spouse name: N/A  . Number of children: N/A  . Years of education: N/A   Occupational History  . Not on file.   Social History Main Topics  . Smoking status: Current Some Day Smoker    Packs/day: 0.50    Types: Cigarettes  . Smokeless tobacco: Never Used  . Alcohol use No  . Drug use: No  . Sexual activity: Yes   Other Topics Concern  . Not on file   Social History Narrative  . No narrative on  file    Physical Exam: BP 137/71   Pulse 85   Resp 18   Ht 5\' 5"  (1.651 m)   Wt 253 lb (114.8 kg)   LMP 01/23/2016 (Exact Date)   BMI 42.10 kg/m   Gen: NAD CV: RRR Pulm: CTAB Abd: soft, mildly ttp in lower abdomen diffusely, especially in suprapubic region.  No rebound or guarding. Pelvic: closed per RN Ext: no e/c/t  Consults: None  Significant Findings/ Diagnostic Studies: labs: WBC 14, otherwise normal.  Procedures:  NST Baseline FHR: 125 beats/min Variability: moderate Accelerations: present Decelerations: absent Tocometry: minimal activity  Interpretation:  INDICATIONS: abdominal pain RESULTS:  A NST procedure was performed with FHR monitoring and a normal baseline established, appropriate time of 20-40 minutes of evaluation, and accels >2 seen w 15x15 characteristics.  Results show a REACTIVE NST.    Discharge Condition: stable  Disposition: 01-Home or Self Care  Diet: Regular diet  Discharge Activity: Activity as tolerated   Allergies as of 09/18/2016      Reactions   Amoxicillin Rash   Cefaclor Rash   Cefadroxil Rash   Cefuroxime Rash   Cephalexin Rash   Clarithromycin Rash   Clindamycin Anaphylaxis   Doxycycline Swelling   Optic pressure_fluid swelling   Erythromycin Rash   Penicillins Rash   Sulfamethoxazole-trimethoprim Rash   Vitamin A    Other reaction(s): Other (See Comments) Causes pressure in brain to go up and she has to have lumbar puncture to draw fluid off.   Dextromethorphan Hbr Rash, Swelling   Morphine Itching   Other    Other reaction(s): Other (See Comments) "Pretty much every antibiotic there is" "Pretty much every antibiotic there is"   Azithromycin Rash   Red Dye Rash      Medication List    TAKE these medications   cyclobenzaprine 10 MG tablet Commonly known as:  FLEXERIL Take 1 tablet (10 mg total) by mouth every 8 (eight) hours as needed for muscle spasms.   multivitamin-prenatal 27-0.8 MG Tabs tablet Take 1  tablet by mouth daily at 12 noon.   ondansetron 4 MG disintegrating tablet Commonly known as:  ZOFRAN-ODT   ranitidine 150 MG tablet Commonly known as:  ZANTAC Take 150 mg by mouth 2 (two) times daily.        Total time spent taking care of this patient: 30 minutes  Signed: Letitia Libra, MD  09/18/2016, 8:53 AM

## 2016-09-18 NOTE — Telephone Encounter (Signed)
Dr. Norlene Campbelltter from [facility] called after hour nurse this am to speak c SDJ about pt as she was there c abdominal pain.  She was dilated and they had no way to monitor her.  Pt is currently admitted to L&D at [facility].

## 2016-09-18 NOTE — Discharge Summary (Signed)
  See FPN 

## 2016-09-18 NOTE — OB Triage Note (Signed)
0345 Pt to OBS 3 transferred from [facility].  Pt to bathroom to change into hospital gown.    Urine collected    EFM explained and applied No acute distress noted. Will continue to monitor

## 2016-09-26 ENCOUNTER — Ambulatory Visit (INDEPENDENT_AMBULATORY_CARE_PROVIDER_SITE_OTHER): Payer: 59 | Admitting: Certified Nurse Midwife

## 2016-09-26 ENCOUNTER — Ambulatory Visit
Admission: RE | Admit: 2016-09-26 | Discharge: 2016-09-26 | Disposition: A | Discharge status: Home / Self Care | Payer: 59 | Source: Ambulatory Visit | Attending: Certified Nurse Midwife | Admitting: Certified Nurse Midwife

## 2016-09-26 ENCOUNTER — Encounter: Payer: Self-pay | Admitting: Certified Nurse Midwife

## 2016-09-26 ENCOUNTER — Encounter: Payer: 59 | Admitting: Obstetrics and Gynecology

## 2016-09-26 VITALS — BP 122/82 | Wt 251.0 lb

## 2016-09-26 DIAGNOSIS — B373 Candidiasis of vulva and vagina: Secondary | ICD-10-CM

## 2016-09-26 DIAGNOSIS — O26899 Other specified pregnancy related conditions, unspecified trimester: Secondary | ICD-10-CM | POA: Diagnosis not present

## 2016-09-26 DIAGNOSIS — B3731 Acute candidiasis of vulva and vagina: Secondary | ICD-10-CM

## 2016-09-26 DIAGNOSIS — Z981 Arthrodesis status: Secondary | ICD-10-CM

## 2016-09-26 DIAGNOSIS — N9089 Other specified noninflammatory disorders of vulva and perineum: Secondary | ICD-10-CM | POA: Diagnosis not present

## 2016-09-26 DIAGNOSIS — O368130 Decreased fetal movements, third trimester, not applicable or unspecified: Secondary | ICD-10-CM

## 2016-09-26 DIAGNOSIS — R109 Unspecified abdominal pain: Secondary | ICD-10-CM | POA: Diagnosis present

## 2016-09-26 DIAGNOSIS — M545 Low back pain, unspecified: Secondary | ICD-10-CM

## 2016-09-26 DIAGNOSIS — Z3A35 35 weeks gestation of pregnancy: Secondary | ICD-10-CM

## 2016-09-26 DIAGNOSIS — O26893 Other specified pregnancy related conditions, third trimester: Secondary | ICD-10-CM

## 2016-09-26 DIAGNOSIS — O0993 Supervision of high risk pregnancy, unspecified, third trimester: Secondary | ICD-10-CM

## 2016-09-26 LAB — POCT WET PREP (WET MOUNT): Trichomonas Wet Prep HPF POC: ABSENT

## 2016-09-26 MED ORDER — NYSTATIN 100000 UNIT/GM EX OINT: 1.0000 "application " | TOPICAL_OINTMENT | Freq: Two times a day (BID) | CUTANEOUS | 0 refills | Status: DC

## 2016-09-26 MED ORDER — TERCONAZOLE 0.8 % VA CREA: 1.0000 | TOPICAL_CREAM | Freq: Every day | VAGINAL | 0 refills | Status: AC

## 2016-09-26 MED ORDER — TRIAMCINOLONE ACETONIDE 0.1 % EX OINT: 1.0000 "application " | TOPICAL_OINTMENT | Freq: Two times a day (BID) | CUTANEOUS | 0 refills | Status: DC

## 2016-09-26 NOTE — Progress Notes (Signed)
Give note to take out of work over the weekend.

## 2016-09-26 NOTE — Progress Notes (Signed)
"  Not feeling well". Complains in a worsening of pain in the mid abdomen above the umbilicus over the last 2 days that worsens with any movement and is tender to touch.  Decreased FM. Has not eaten this AM. Has had vulvar irritation and pain with intercourse, spotting 2 days ago. Having some irregular uterine tightening Reports alternating diarrhea and constipation, currently feels constipated. Denies fever, but has been sweating a lot at night Also complains of sacral back pain with some radiation up her back. Hx of spinal fusion Reports that when she has spinal tap done it is under ultrasound-most likely will not be able to get regional anesthesia per Dr Tiburcio PeaHarris 3hr GTT WNL. Ultrasound 7/12 at [facility]: EFW 4#4oz (21st%) Exam: T=97.8. Abdomen: FH 39cm/ FHTs 140s with accelerations to 160, moderate variability. NST reactive Tenderness in mid abdomen above umbilicus and to the left with light palpation; diastasis present Back: no point tenderness, points to SI area as to most painful area External/BUS: curdlike, off white discharge at introitus; Labia irritated Vagina: off white discharge present Wet prep positive for hyphae, neg for Trich and clue cells Cervix: closed/ 30%/-2 A: IUP at 35wk2d with monilial vulvovaginitis No evidence of preterm labor R/O abdominal/umbilical hernia Low back pain with history of spinal fusion-may be increased due to fetal head in pelvis Reactive NST P: Terazol 3 cream/ Nystatin and TAM ointment externally Abdominal ultrasound today to rule out umbilical hernia (done-no evidence of hernia) Referral to [facility] -recommendations for treatment of back pain, precautions for back in labor Referral to anesthesia regarding regional anesthesia. NST/AFI 1 week and cultures Local heat/ice to back/ Biofreeze

## 2016-09-26 NOTE — Progress Notes (Signed)
Pt c/o pain in stomach and back, can't get comfortable. Also had spotting 2 days ago, swelling in feet and legs.

## 2016-09-29 ENCOUNTER — Other Ambulatory Visit: Payer: Self-pay | Admitting: Certified Nurse Midwife

## 2016-09-29 DIAGNOSIS — O99213 Obesity complicating pregnancy, third trimester: Secondary | ICD-10-CM

## 2016-09-30 ENCOUNTER — Observation Stay
Admission: EM | Admit: 2016-09-30 | Discharge: 2016-09-30 | Disposition: A | Discharge status: Home / Self Care | Payer: 59 | Attending: Advanced Practice Midwife | Admitting: Advanced Practice Midwife

## 2016-09-30 ENCOUNTER — Telehealth: Payer: Self-pay | Admitting: Obstetrics and Gynecology

## 2016-09-30 DIAGNOSIS — Z981 Arthrodesis status: Secondary | ICD-10-CM | POA: Insufficient documentation

## 2016-09-30 DIAGNOSIS — O99333 Smoking (tobacco) complicating pregnancy, third trimester: Secondary | ICD-10-CM | POA: Insufficient documentation

## 2016-09-30 DIAGNOSIS — R109 Unspecified abdominal pain: Secondary | ICD-10-CM | POA: Diagnosis not present

## 2016-09-30 DIAGNOSIS — F17210 Nicotine dependence, cigarettes, uncomplicated: Secondary | ICD-10-CM | POA: Diagnosis not present

## 2016-09-30 DIAGNOSIS — Z3A35 35 weeks gestation of pregnancy: Secondary | ICD-10-CM | POA: Diagnosis not present

## 2016-09-30 DIAGNOSIS — Z79899 Other long term (current) drug therapy: Secondary | ICD-10-CM | POA: Diagnosis not present

## 2016-09-30 DIAGNOSIS — O26893 Other specified pregnancy related conditions, third trimester: Principal | ICD-10-CM | POA: Insufficient documentation

## 2016-09-30 MED ORDER — SOD CITRATE-CITRIC ACID 500-334 MG/5ML PO SOLN
30.0000 mL | Freq: Once | ORAL | Status: AC
  Administered 2016-09-30: 30 mL via ORAL

## 2016-09-30 MED ORDER — SOD CITRATE-CITRIC ACID 500-334 MG/5ML PO SOLN
ORAL | Status: AC
Start: 2016-09-30 — End: 2016-09-30
  Administered 2016-09-30: 30 mL via ORAL
  Filled 2016-09-30: qty 15

## 2016-09-30 NOTE — Telephone Encounter (Signed)
I advised pt to go to the hospital since we have no open spots where she can be seen, pt states the pain is coming every 5 min and she is seeing spots,

## 2016-09-30 NOTE — OB Triage Note (Signed)
Pt c/o abdominal pain since 2 am on the left side that comes and goes. Also has some white vaginal discharge, she was treated last Friday for a yeast infection.

## 2016-09-30 NOTE — Telephone Encounter (Signed)
Pt is calling this morning needing to be advise on what to do. Pt reports Pain above her left hip and Abdominal tighting with seeing spots. Pt is going to be 36 week on Thursday. At this time there is no opening please advise

## 2016-09-30 NOTE — Discharge Summary (Signed)
Physician Final Progress Note  Patient ID: Courtney Sanchez MRN: [id number] DOB/AGE: 24/11/1990 25 y.o.  Admit date: 09/30/2016 Admitting provider: Tresea MallJane Paulyne Mooty, CNM Discharge date: 09/30/2016   Admission Diagnoses: abdominal pain  Discharge Diagnoses:  Active Problems:   Indication for care in labor and delivery, antepartum IUP at 35 weeks 6 days with reactive NST, not in labor, abdominal pain possibly intestinal and round ligament  History of Present Illness: The patient is a 26 y.o. female G1P0 at 6178w6d who presents for pain on her left side that is constant since this morning. She also has general abdominal pain lower and upper that feels crampy. She feels the pain approximately every 10 minutes. She admits positive fetal movement. She denies LOF, VB, N/V, fever/chills, SOB, burning with urination. She admits diarrhea over the last 3 days. She admits to eating [employer] or pizza every day. She admits to drinking water.    Past Medical History:  Diagnosis Date  . Asthma   . Endometriosis   . Endometriosis   . Hidradenitis   . Hydronephrosis   . IBS (irritable bowel syndrome)   . Migraine   . Migraine   . Pseudotumor cerebri   . Sciatica   . Scoliosis     Past Surgical History:  Procedure Laterality Date  . COLONOSCOPY  2001;2008  . 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 LAPAROSCOPY  04/2007  . SPINAL FUSION    . SPINAL FUSION  2008  . SPINAL FUSION    . TONSILLECTOMY      No current facility-administered medications on file prior to encounter.    Current Outpatient Prescriptions on File Prior to Encounter  Medication Sig Dispense Refill  . cyclobenzaprine (FLEXERIL) 10 MG tablet Take 1 tablet (10 mg total) by mouth every 8 (eight) hours as needed for muscle spasms. 30 tablet 0  . nystatin ointment (MYCOSTATIN) Apply 1 application topically 2 (two) times daily. 30 g 0  . Prenatal Vit-Fe Fumarate-FA  (MULTIVITAMIN-PRENATAL) 27-0.8 MG TABS tablet Take 1 tablet by mouth daily at 12 noon.    . ranitidine (ZANTAC) 150 MG tablet Take 150 mg by mouth 2 (two) times daily.    Marland Kitchen. triamcinolone ointment (KENALOG) 0.1 % Apply 1 application topically 2 (two) times daily. 30 g 0  . ondansetron (ZOFRAN-ODT) 4 MG disintegrating tablet       Allergies  Allergen Reactions  . Amoxicillin Rash  . Cefaclor Rash  . Cefadroxil Rash  . Cefuroxime Rash  . Cephalexin Rash  . Clarithromycin Rash  . Clindamycin Anaphylaxis  . Doxycycline Swelling    Optic pressure_fluid swelling  . Erythromycin Rash  . Penicillins Rash  . Sulfamethoxazole-Trimethoprim Rash  . Vitamin A     Other reaction(s): Other (See Comments) Causes pressure in brain to go up and she has to have lumbar puncture to draw fluid off.  . Dextromethorphan Hbr Rash and Swelling  . Morphine Itching  . Other     Other reaction(s): Other (See Comments) "Pretty much every antibiotic there is" "Pretty much every antibiotic there is"   . Azithromycin Rash  . Red Dye Rash    Social History   Social History  . Marital status: Single    Spouse name: N/A  . Number of children: N/A  . Years of education: N/A   Occupational History  . Not on file.   Social History Main Topics  . Smoking status: Current  Some Day Smoker    Packs/day: 0.50    Types: Cigarettes  . Smokeless tobacco: Never Used  . Alcohol use No  . Drug use: No  . Sexual activity: Yes   Other Topics Concern  . Not on file   Social History Narrative  . No narrative on file    Physical Exam: BP 139/76 (BP Location: Right Arm)   Pulse 92   Temp 98.7 F (37.1 C) (Oral)   Resp 16   Ht 5\' 5"  (1.651 m)   Wt 251 lb (113.9 kg)   LMP 01/23/2016 (Exact Date)   BMI 41.77 kg/m   Gen: NAD CV: RRR Pulm: CTAB Pelvic: deferred  Toco: uterine irritability Fetal Well Being: 135 bpm baseline, moderate variability, +accelerations, -decelerations Ext: no evidence of  DVT  Consults: None  Significant Findings/ Diagnostic Studies: none  Procedures: NST  Discharge Condition: good  Disposition: 01-Home or Self Care  Diet: bland diet and advance as tolerated to healthy pregnancy diet  Discharge Activity: rest for today with gradual return to normal routine, use heat, abdominal support band, epsom salt bath soaks, stretching exercises  Discharge Instructions    Discharge activity:  No Restrictions    Complete by:  As directed    Discharge diet:    Complete by:  As directed    Bland diet today and advance as tolerated to healthy pregnancy diet   Fetal Kick Count:  Lie on our left side for one hour after a meal, and count the number of times your baby kicks.  If it is less than 5 times, get up, move around and drink some juice.  Repeat the test 30 minutes later.  If it is still less than 5 kicks in an hour, notify your doctor.    Complete by:  As directed    No sexual activity restrictions    Complete by:  As directed    Notify physician for a general feeling that "something is not right"    Complete by:  As directed    Notify physician for increase or change in vaginal discharge    Complete by:  As directed    Notify physician for intestinal cramps, with or without diarrhea, sometimes described as "gas pain"    Complete by:  As directed    Notify physician for leaking of fluid    Complete by:  As directed    Notify physician for low, dull backache, unrelieved by heat or Tylenol    Complete by:  As directed    Notify physician for menstrual like cramps    Complete by:  As directed    Notify physician for pelvic pressure    Complete by:  As directed    Notify physician for uterine contractions.  These may be painless and feel like the uterus is tightening or the baby is  "balling up"    Complete by:  As directed    Notify physician for vaginal bleeding    Complete by:  As directed    PRETERM LABOR:  Includes any of the follwing symptoms that  occur between 20 - [redacted] weeks gestation.  If these symptoms are not stopped, preterm labor can result in preterm delivery, placing your baby at risk    Complete by:  As directed      Allergies as of 09/30/2016      Reactions   Amoxicillin Rash   Cefaclor Rash   Cefadroxil Rash   Cefuroxime Rash   Cephalexin Rash  Clarithromycin Rash   Clindamycin Anaphylaxis   Doxycycline Swelling   Optic pressure_fluid swelling   Erythromycin Rash   Penicillins Rash   Sulfamethoxazole-trimethoprim Rash   Vitamin A    Other reaction(s): Other (See Comments) Causes pressure in brain to go up and she has to have lumbar puncture to draw fluid off.   Dextromethorphan Hbr Rash, Swelling   Morphine Itching   Other    Other reaction(s): Other (See Comments) "Pretty much every antibiotic there is" "Pretty much every antibiotic there is"   Azithromycin Rash   Red Dye Rash      Medication List    TAKE these medications   cyclobenzaprine 10 MG tablet Commonly known as:  FLEXERIL Take 1 tablet (10 mg total) by mouth every 8 (eight) hours as needed for muscle spasms.   multivitamin-prenatal 27-0.8 MG Tabs tablet Take 1 tablet by mouth daily at 12 noon.   nystatin ointment Commonly known as:  MYCOSTATIN Apply 1 application topically 2 (two) times daily.   ondansetron 4 MG disintegrating tablet Commonly known as:  ZOFRAN-ODT   ranitidine 150 MG tablet Commonly known as:  ZANTAC Take 150 mg by mouth 2 (two) times daily.   triamcinolone ointment 0.1 % Commonly known as:  KENALOG Apply 1 application topically 2 (two) times daily.      Follow-up Information    [facility]. Go to.   Why:  regular scheduled prenatal appointment Contact information: [address] [place] [phone]          Total time spent taking care of this patient: 25 minutes  Signed: Tresea MallJane Cataleia Gade, CNM  09/30/2016, 12:58 PM

## 2016-10-03 ENCOUNTER — Encounter: Payer: 59 | Admitting: Certified Nurse Midwife

## 2016-10-03 ENCOUNTER — Encounter: Payer: Self-pay | Admitting: Certified Nurse Midwife

## 2016-10-03 ENCOUNTER — Ambulatory Visit (INDEPENDENT_AMBULATORY_CARE_PROVIDER_SITE_OTHER): Payer: 59 | Admitting: Certified Nurse Midwife

## 2016-10-03 ENCOUNTER — Ambulatory Visit (INDEPENDENT_AMBULATORY_CARE_PROVIDER_SITE_OTHER): Payer: 59

## 2016-10-03 ENCOUNTER — Other Ambulatory Visit: Payer: 59

## 2016-10-03 ENCOUNTER — Other Ambulatory Visit: Payer: Self-pay | Admitting: Certified Nurse Midwife

## 2016-10-03 ENCOUNTER — Observation Stay
Admission: EM | Admit: 2016-10-03 | Discharge: 2016-10-03 | Disposition: A | Discharge status: Home / Self Care | Payer: 59 | Attending: Obstetrics and Gynecology | Admitting: Obstetrics and Gynecology

## 2016-10-03 VITALS — BP 118/80 | Wt 256.0 lb

## 2016-10-03 DIAGNOSIS — O99213 Obesity complicating pregnancy, third trimester: Secondary | ICD-10-CM

## 2016-10-03 DIAGNOSIS — Z981 Arthrodesis status: Secondary | ICD-10-CM | POA: Insufficient documentation

## 2016-10-03 DIAGNOSIS — O0993 Supervision of high risk pregnancy, unspecified, third trimester: Secondary | ICD-10-CM

## 2016-10-03 DIAGNOSIS — Z3685 Encounter for antenatal screening for Streptococcus B: Secondary | ICD-10-CM

## 2016-10-03 DIAGNOSIS — Z3490 Encounter for supervision of normal pregnancy, unspecified, unspecified trimester: Secondary | ICD-10-CM

## 2016-10-03 DIAGNOSIS — F17210 Nicotine dependence, cigarettes, uncomplicated: Secondary | ICD-10-CM | POA: Diagnosis not present

## 2016-10-03 DIAGNOSIS — O26893 Other specified pregnancy related conditions, third trimester: Principal | ICD-10-CM | POA: Insufficient documentation

## 2016-10-03 DIAGNOSIS — Z362 Encounter for other antenatal screening follow-up: Secondary | ICD-10-CM | POA: Diagnosis not present

## 2016-10-03 DIAGNOSIS — Z3A36 36 weeks gestation of pregnancy: Secondary | ICD-10-CM

## 2016-10-03 DIAGNOSIS — O99333 Smoking (tobacco) complicating pregnancy, third trimester: Secondary | ICD-10-CM | POA: Insufficient documentation

## 2016-10-03 DIAGNOSIS — R030 Elevated blood-pressure reading, without diagnosis of hypertension: Secondary | ICD-10-CM | POA: Diagnosis not present

## 2016-10-03 DIAGNOSIS — Z113 Encounter for screening for infections with a predominantly sexual mode of transmission: Secondary | ICD-10-CM

## 2016-10-03 LAB — CBC
HEMATOCRIT: 33.3 % — AB (ref 35.0–47.0)
HEMOGLOBIN: 11.4 g/dL — AB (ref 12.0–16.0)
MCH: 29.9 pg (ref 26.0–34.0)
MCHC: 34.3 g/dL (ref 32.0–36.0)
MCV: 87 fL (ref 80.0–100.0)
Platelets: 250 10*3/uL (ref 150–440)
RBC: 3.83 MIL/uL (ref 3.80–5.20)
RDW: 13.2 % (ref 11.5–14.5)
WBC: 15.4 10*3/uL — AB (ref 3.6–11.0)

## 2016-10-03 LAB — COMPREHENSIVE METABOLIC PANEL
ALBUMIN: 2.7 g/dL — AB (ref 3.5–5.0)
ALK PHOS: 158 U/L — AB (ref 38–126)
ALT: 14 U/L (ref 14–54)
ANION GAP: 8 (ref 5–15)
AST: 15 U/L (ref 15–41)
BILIRUBIN TOTAL: 0.6 mg/dL (ref 0.3–1.2)
BUN: 10 mg/dL (ref 6–20)
CALCIUM: 9.3 mg/dL (ref 8.9–10.3)
CO2: 21 mmol/L — ABNORMAL LOW (ref 22–32)
Chloride: 107 mmol/L (ref 101–111)
Creatinine, Ser: 0.67 mg/dL (ref 0.44–1.00)
GFR calc Af Amer: >60 mL/min (ref >60)
GLUCOSE: 99 mg/dL (ref 65–99)
Potassium: 3.8 mmol/L (ref 3.5–5.1)
Sodium: 136 mmol/L (ref 135–145)
TOTAL PROTEIN: 6 g/dL — AB (ref 6.5–8.1)

## 2016-10-03 LAB — PROTEIN / CREATININE RATIO, URINE
CREATININE, URINE: 75 mg/dL
Protein Creatinine Ratio: 0.15 mg/mg{Cre} (ref 0.00–0.15)
Total Protein, Urine: 11 mg/dL

## 2016-10-03 MED ORDER — ACETAMINOPHEN 325 MG PO TABS
650.0000 mg | ORAL_TABLET | ORAL | Status: DC | PRN
  Administered 2016-10-03: 650 mg via ORAL
  Filled 2016-10-03: qty 2

## 2016-10-03 NOTE — Progress Notes (Signed)
Seen twice in L&D since last prenatal visit. Once for abdominal cramping and this AM for elevated BP at work: 149/98 and 160/103 with headache. Normotensive in L&D and labs WNL. PC ratio 150mg m. NST reactive. Increased edema in ankles and feet. BAby active Saw Dr Page SpiroFlint, orthopedist who did spinal fusion. NO restrictions on pushing when in labor. Patient given her Xrays of her back to take to anesthesia consult next week [facility]. Today ultrasound: EFW 5#2oz (11.3%) with lagging AC, AFI 14.98cm, normal Dopplers Breast/ ?depo?Mirena Also discussed use of nicotine patch to help her stop smoking Labor precautions/ Preeclampsia precautions NST and BP check on 8/7

## 2016-10-03 NOTE — Progress Notes (Signed)
L&D follow up AFI today

## 2016-10-03 NOTE — H&P (Signed)
Obstetric H&P   Chief Complaint: Elevated BP  Prenatal Care Provider: [facility]  History of Present Illness: 26 y.o. G1P0 5743w2d by 10/29/2016, by Last Menstrual Period = 6 week US presenting to L&D with elevated BP at 2 AM this morning after talking with the nurse triage line.  She does not have a history of CHTN, and her BP's this pregnancy have been normotensive.  Total weight gain this pregnancy has been 5lbs.  Some concern for possible development of IUGR on 28 week scan with overall growth c/w 30%ile but lagging AC and FL (8.1% and 4.2%)  [facility] showed appropriate interval growth with EFW of 1924g or 4lbs 4 oz c/w 21%ile.  Clinic [facility]  Dating LMP=6week US  Genetic Screen 1 Screen: negative  Anatomic US Complete, female  GTT Early: 103Third trimester: 143, 3hr GTT WNL  Rhogam N.A  TDaP vaccine 09/12/16                    Baby Food Breast                             Risk Factors: obesity, fetal growth restriction with elevated Dopplers at 28.6 weeks ([facility]), s/p spinal fusion 2008   Prenatal Labs  Blood type: --/--/O POS (01/19 2151)   Antibody:Negative (01/15 0000)  Rubella: Immune (01/15 0000) Varicella: I  RPR: Nonreactive (01/15 0000)   HBsAg: Negative (01/15 0000)   HIV: Non-reactive (01/15 0000)   GBS:   Pap: No pap on record  E. Coli UTI on NOB  Chlamydia positive at NOB    Review of Systems: 10 point review of systems negative unless otherwise noted in HPI  Past Medical History: Past Medical History:  Diagnosis Date  . Asthma   . Endometriosis   . Endometriosis   . Hidradenitis   . Hydronephrosis   . IBS (irritable bowel syndrome)   . Migraine   . Migraine   . Pseudotumor cerebri   . Sciatica   . Scoliosis     Past Surgical History: Past Surgical History:  Procedure Laterality Date  . COLONOSCOPY  2001;2008  . 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  LAPAROSCOPY  04/2007  . SPINAL FUSION    . SPINAL FUSION  2008  . SPINAL FUSION    . TONSILLECTOMY      Family History: Family History  Problem Relation Age of Onset  . Cancer Mother   . Bipolar disorder Mother   . Depression Mother   . Cancer Father   . Cancer Maternal Grandmother   . Depression Maternal Grandmother     Social History: Social History   Social History  . Marital status: Single    Spouse name: N/A  . Number of children: N/A  . Years of education: N/A   Occupational History  . Not on file.   Social History Main Topics  . Smoking status: Current Some Day Smoker    Packs/day: 0.50    Types: Cigarettes  . Smokeless tobacco: Never Used  . Alcohol use No  . Drug use: No  . Sexual activity: Yes   Other Topics Concern  . Not on file   Social History Narrative  . No narrative on file    Medications: Prior to Admission medications   Medication Sig Start  Date End Date Taking? Authorizing Provider  cyclobenzaprine (FLEXERIL) 10 MG tablet Take 1 tablet (10 mg total) by mouth every 8 (eight) hours as needed for muscle spasms. 08/30/16  Yes Vena AustriaStaebler, Latania Bascomb, MD  nystatin ointment (MYCOSTATIN) Apply 1 application topically 2 (two) times daily. 09/26/16  Yes Farrel ConnersGutierrez, Colleen, CNM  Prenatal Vit-Fe Fumarate-FA (MULTIVITAMIN-PRENATAL) 27-0.8 MG TABS tablet Take 1 tablet by mouth daily at 12 noon.   Yes [provider]  ranitidine (ZANTAC) 150 MG tablet Take 150 mg by mouth 2 (two) times daily.   Yes [provider]  triamcinolone ointment (KENALOG) 0.1 % Apply 1 application topically 2 (two) times daily. 09/26/16  Yes Farrel ConnersGutierrez, Colleen, CNM  ondansetron (ZOFRAN-ODT) 4 MG disintegrating tablet  07/03/16   [provider]    Allergies: Allergies  Allergen Reactions  . Amoxicillin Rash  . Cefaclor Rash  . Cefadroxil Rash  . Cefuroxime Rash  . Cephalexin Rash  . Clarithromycin Rash  . Clindamycin Anaphylaxis  . Doxycycline Swelling     Optic pressure_fluid swelling  . Erythromycin Rash  . Penicillins Rash  . Sulfamethoxazole-Trimethoprim Rash  . Vitamin A     Other reaction(s): Other (See Comments) Causes pressure in brain to go up and she has to have lumbar puncture to draw fluid off.  . Dextromethorphan Hbr Rash and Swelling  . Morphine Itching  . Other     Other reaction(s): Other (See Comments) "Pretty much every antibiotic there is" "Pretty much every antibiotic there is"   . Azithromycin Rash  . Red Dye Rash    Physical Exam: Vitals: Blood pressure 132/79, pulse (!) 109, temperature 98.2 F (36.8 C), temperature source Oral, resp. rate 18, height 5\' 5"  (1.651 m), weight 255 lb (115.7 kg), last menstrual period 01/23/2016, unknown if currently breastfeeding.  Urine Dip Protein: P/C ratio pending  FHT: 130, moderate, +accels, no decels Toco: none  General: NAD HEENT: normocephalic, anicteric Pulmonary: No increased work of breathing Cardiovascular: RRR, distal pulses 2+ Abdomen: Gravid, non-tender Leopolds: vtx Genitourinary: deferred no labor complaints Extremities: no edema, erythema, or tenderness Neurologic: Grossly intact Psychiatric: mood appropriate, affect full  Labs: Results for orders placed or performed during the hospital encounter of 10/03/16 (from the past 24 hour(s))  Comprehensive metabolic panel     Status: Abnormal   Collection Time: 10/03/16  6:08 AM  Result Value Ref Range   Sodium 136 135 - 145 mmol/L   Potassium 3.8 3.5 - 5.1 mmol/L   Chloride 107 101 - 111 mmol/L   CO2 21 (L) 22 - 32 mmol/L   Glucose, Bld 99 65 - 99 mg/dL   BUN 10 6 - 20 mg/dL   Creatinine, Ser 1.610.67 0.44 - 1.00 mg/dL   Calcium 9.3 8.9 - 09.610.3 mg/dL   Total Protein 6.0 (L) 6.5 - 8.1 g/dL   Albumin 2.7 (L) 3.5 - 5.0 g/dL   AST 15 15 - 41 U/L   ALT 14 14 - 54 U/L   Alkaline Phosphatase 158 (H) 38 - 126 U/L   Total Bilirubin 0.6 0.3 - 1.2 mg/dL   GFR calc non Af Amer >60 >60 mL/min   GFR calc Af  Amer >60 >60 mL/min   Anion gap 8 5 - 15  CBC     Status: Abnormal   Collection Time: 10/03/16  6:08 AM  Result Value Ref Range   WBC 15.4 (H) 3.6 - 11.0 K/uL   RBC 3.83 3.80 - 5.20 MIL/uL   Hemoglobin 11.4 (L) 12.0 -  16.0 g/dL   HCT 09.8 (L) 11.9 - 14.7 %   MCV 87.0 80.0 - 100.0 fL   MCH 29.9 26.0 - 34.0 pg   MCHC 34.3 32.0 - 36.0 g/dL   RDW 82.9 56.2 - 13.0 %   Platelets 250 150 - 440 K/uL    Assessment: 26 y.o. G1P0 [redacted]w[redacted]d by 10/29/2016, by Last Menstrual Period = 6 week [place] presenting with elevated BP  Plan: 1) Elevated home BP reading - normotensive here on L&D on presentation. - Baseline PIH labs obtained  2) Fetus - cat I tracing  3) PNL - see HPI  4) TDAP - UTD  5) Disposition - pending labs (everything normal thus far awaiting result of P/C ratio) if normal discharge home.  Has follow up with growth today

## 2016-10-03 NOTE — OB Triage Note (Signed)
Patient discharged home after PCR results WNL, spoke to Erskine SquibbJane in department ok to discharge and [facility]

## 2016-10-03 NOTE — OB Triage Note (Signed)
0355 Pt to OBS 2 for evaluation c/o elevated blood pressures.  Pt stated that she had elevated BP's at her doctors appt yesterday morning 138/84 and the blood pressure at 0236 was 160/103. Pt called on call line and was told to come to the hospital to be seen.  Pt to bathroom to change into a hospital gown.  EFM explained and applied. NO acute distress noted.   BP on arrival is 132/79.

## 2016-10-05 NOTE — Discharge Summary (Signed)
OB Discharge Summary     Patient Name: Courtney Sanchez DOB: 07/19/1990 MRN: [id number]  Date of admission: 10/03/2016 Delivering MD: This patient has no babies on file.  Date of discharge: 10/05/2016  Admitting diagnosis: 36 wks preg elevated blood pressure Intrauterine pregnancy: 775w4d     Secondary diagnosis:  Active Problems:   Pregnancy   Labor and delivery indication for care or intervention  Additional problems: Elevated home BP reading     Discharge diagnosis: Normal blood pressure                                    Hospital course:  26 yo G1P0 at 6175w4dpresenting with elevated blood pressures on home reading and at her orthopedic doctor.  Normotensive on admission.  One elevated BP noted but nursing staff said states patient was lying on her blood pressure cuff at that time.  Baseline PIH labs were sent given that the patient is a at increased risk of developing blood pressure issues during this pregnancy given her weight.  We discussed should blood pressures elevated in setting of otherwise normal labs recommendation is delivery at 37 weeks or greater but not prior.    Labs: Lab Results  Component Value Date   WBC 15.4 (H) 10/03/2016   HGB 11.4 (L) 10/03/2016   HCT 33.3 (L) 10/03/2016   MCV 87.0 10/03/2016   PLT 250 10/03/2016   CMP Latest Ref Rng & Units 10/03/2016  Glucose 65 - 99 mg/dL 99  BUN 6 - 20 mg/dL 10  Creatinine 4.090.44 - 8.111.00 mg/dL 9.140.67  Sodium 782135 - 956145 mmol/L 136  Potassium 3.5 - 5.1 mmol/L 3.8  Chloride 101 - 111 mmol/L 107  CO2 22 - 32 mmol/L 21(L)  Calcium 8.9 - 10.3 mg/dL 9.3  Total Protein 6.5 - 8.1 g/dL 6.0(L)  Total Bilirubin 0.3 - 1.2 mg/dL 0.6  Alkaline Phos 38 - 126 U/L 158(H)  AST 15 - 41 U/L 15  ALT 14 - 54 U/L 14    Discharge instruction: per After Visit Summary and "Baby and Me Booklet".  After visit meds:  Allergies as of 10/03/2016      Reactions   Amoxicillin Rash   Cefaclor Rash   Cefadroxil Rash   Cefuroxime Rash   Cephalexin Rash   Clarithromycin Rash   Clindamycin Anaphylaxis   Doxycycline Swelling   Optic pressure_fluid swelling   Erythromycin Rash   Penicillins Rash   Sulfamethoxazole-trimethoprim Rash   Vitamin A    Other reaction(s): Other (See Comments) Causes pressure in brain to go up and she has to have lumbar puncture to draw fluid off.   Dextromethorphan Hbr Rash, Swelling   Morphine Itching   Other    Other reaction(s): Other (See Comments) "Pretty much every antibiotic there is" "Pretty much every antibiotic there is"   Azithromycin Rash   Red Dye Rash      Medication List    TAKE these medications   cyclobenzaprine 10 MG tablet Commonly known as:  FLEXERIL Take 1 tablet (10 mg total) by mouth every 8 (eight) hours as needed for muscle spasms.   multivitamin-prenatal 27-0.8 MG Tabs tablet Take 1 tablet by mouth daily at 12 noon.   nystatin ointment Commonly known as:  MYCOSTATIN Apply 1 application topically 2 (two) times daily.   ondansetron 4 MG disintegrating tablet Commonly known as:  ZOFRAN-ODT   ranitidine 150 MG tablet  Commonly known as:  ZANTAC Take 150 mg by mouth 2 (two) times daily.   triamcinolone ointment 0.1 % Commonly known as:  KENALOG Apply 1 application topically 2 (two) times daily.       Diet: routine diet  Activity: Advance as tolerated. Pelvic rest for 6 weeks.   Outpatient follow ZO:XWRUEup:later this morning Follow up Appt:Future Appointments Date Time Provider Department Center  10/07/2016 1:30 PM Conard NovakJackson, Stephen D, MD [facility] None  10/08/2016 1:30 PM [facility] PAT4 [facility] None  10/09/2016 9:00 AM [facility] US 1 [facility]   Follow up Visit:No Follow-up on file.  Discharge home   10/05/2016 Vena AustriaAndreas Elisavet Buehrer, MD

## 2016-10-06 ENCOUNTER — Other Ambulatory Visit: Payer: Self-pay | Admitting: *Deleted

## 2016-10-06 DIAGNOSIS — O365930 Maternal care for other known or suspected poor fetal growth, third trimester, not applicable or unspecified: Secondary | ICD-10-CM

## 2016-10-07 ENCOUNTER — Ambulatory Visit (INDEPENDENT_AMBULATORY_CARE_PROVIDER_SITE_OTHER): Payer: 59 | Admitting: Obstetrics and Gynecology

## 2016-10-07 VITALS — BP 134/80 | Wt 255.0 lb

## 2016-10-07 DIAGNOSIS — O0993 Supervision of high risk pregnancy, unspecified, third trimester: Secondary | ICD-10-CM

## 2016-10-07 DIAGNOSIS — Z6841 Body Mass Index (BMI) 40.0 and over, adult: Secondary | ICD-10-CM

## 2016-10-07 DIAGNOSIS — Z3A36 36 weeks gestation of pregnancy: Secondary | ICD-10-CM

## 2016-10-07 DIAGNOSIS — Z981 Arthrodesis status: Secondary | ICD-10-CM

## 2016-10-07 DIAGNOSIS — O99333 Smoking (tobacco) complicating pregnancy, third trimester: Secondary | ICD-10-CM

## 2016-10-07 DIAGNOSIS — O99213 Obesity complicating pregnancy, third trimester: Secondary | ICD-10-CM

## 2016-10-07 LAB — FETAL NONSTRESS TEST

## 2016-10-07 NOTE — Progress Notes (Signed)
Prenatal Visit Note Date: 10/07/2016 Clinic: [facility]  Subjective:  Courtney Sanchez is a 26 y.o. G1P0 at 5417w6d being seen today for ongoing prenatal care.  She is currently monitored for the following issues for this high-risk pregnancy and has History of spinal fusion; BMI 40.0-44.9, adult (HCC); Supervision of high risk pregnancy, antepartum, third trimester; Obesity complicating pregnancy, third trimester; Abdominal pain affecting pregnancy; Pregnancy; H/O partial nephrectomy; Hydronephrosis of right kidney; Idiopathic scoliosis; Pseudotumor cerebri; Acute left-sided low back pain with left-sided sciatica; Intermittent asthma; Migraines; IBS (irritable bowel syndrome); Renal cyst; Labor and delivery indication for care or intervention; Abdominal pain; and Tobacco use affecting pregnancy in third trimester, antepartum on her problem list.   ----------------------------------------------------------------------------------- Patient reports no bleeding, no leaking and occasional contractions.   Contractions: Irregular. Vag. Bleeding: None.  Movement: Present. Denies leaking of fluid.  Denies headaches, visual changes, and RUQ pain.   ----------------------------------------------------------------------------------- The following portions of the patient's history were reviewed and updated as appropriate: allergies, current medications, past family history, past medical history, past social history, past surgical history and problem list. Problem list updated.  Objective:   Vitals:   10/07/16 1330  BP: 134/80  Weight: 255 lb (115.7 kg)  Total weight gain for pregnancy: 9 lb (4.082 kg)   Fetal Status: Fetal Heart Rate (bpm): 150   Movement: Present     General:  Alert, oriented and cooperative. Patient is in no acute distress.  Skin: Skin is warm and dry. No rash noted.   Cardiovascular: Normal heart rate noted  Respiratory: Normal respiratory effort, no problems with respiration  noted  Abdomen: Soft, gravid, appropriate for gestational age. Pain/Pressure: Present     Pelvic:  Cervical exam deferred        Extremities: Normal range of motion.     Mental Status: Normal mood and affect. Normal behavior. Normal judgment and thought content.   Urinalysis: Urine Protein: Negative Urine Glucose: Negative   NST Baseline FHR: 150 beats/min Variability: moderate Accelerations: present Decelerations: absent Tocometry: not done  Interpretation:  INDICATIONS: obesity, BMI>40 RESULTS:  A NST procedure was performed with FHR monitoring and a normal baseline established, appropriate time of 20-40 minutes of evaluation, and accels >2 seen w 15x15 characteristics.  Results show a REACTIVE NST.    Assessment and Plan:  Pregnancy: G1P0 at 7117w6d  1. Tobacco use affecting pregnancy in third trimester, antepartum 2. Supervision of high risk pregnancy, antepartum, third trimester - US OB Limited; Future (AFI in one week) - appt with [facility] in two days for concern for FGR (recent growth 11th %ile) 3. Obesity complicating pregnancy, third trimester - US OB Limited; Future (AFI in one week) 4. History of spinal fusion -Ok by ortho for pushing while in labor - Appt with anesthesia tomorrow 5. BMI 40.0-44.9, adult (HCC) - US OB Limited; Future 6. [redacted] weeks gestation of pregnancy GBS already collected on 8/3  Preterm labor symptoms and general obstetric precautions including but not limited to vaginal bleeding, contractions, leaking of fluid and fetal movement were reviewed in detail with the patient. Please refer to After Visit Summary for other counseling recommendations.   Return in about 1 week (around 10/14/2016) for schedule ultrasound for AFI with NST and ROB.  Thomasene MohairStephen Olan Kurek, MD 10/07/2016 2:58 PM

## 2016-10-08 ENCOUNTER — Encounter
Admission: RE | Admit: 2016-10-08 | Discharge: 2016-10-08 | Disposition: A | Discharge status: Home / Self Care | Payer: 59 | Source: Ambulatory Visit | Attending: Anesthesiology | Admitting: Anesthesiology

## 2016-10-08 NOTE — Consult Note (Signed)
Patient was informed that due to her spinal hardware, scoliosis, history of difficult intrathecal accesss and already elevated WBC that she would have higher chances of being an candidate for an epidural or spinal at a high risk obstetric center such [facility] she [facility] already [facility] as a [facility].  This was explained to the patient who voiced understanding.

## 2016-10-09 ENCOUNTER — Ambulatory Visit
Admission: RE | Admit: 2016-10-09 | Discharge: 2016-10-09 | Disposition: A | Discharge status: Home / Self Care | Payer: 59 | Source: Ambulatory Visit | Attending: Obstetrics & Gynecology | Admitting: Obstetrics & Gynecology

## 2016-10-09 ENCOUNTER — Telehealth: Payer: Self-pay

## 2016-10-09 DIAGNOSIS — Z6841 Body Mass Index (BMI) 40.0 and over, adult: Secondary | ICD-10-CM

## 2016-10-09 DIAGNOSIS — O99333 Smoking (tobacco) complicating pregnancy, third trimester: Secondary | ICD-10-CM | POA: Insufficient documentation

## 2016-10-09 DIAGNOSIS — O0993 Supervision of high risk pregnancy, unspecified, third trimester: Secondary | ICD-10-CM

## 2016-10-09 DIAGNOSIS — O26899 Other specified pregnancy related conditions, unspecified trimester: Secondary | ICD-10-CM

## 2016-10-09 DIAGNOSIS — Z3A37 37 weeks gestation of pregnancy: Secondary | ICD-10-CM | POA: Diagnosis not present

## 2016-10-09 DIAGNOSIS — O99213 Obesity complicating pregnancy, third trimester: Secondary | ICD-10-CM

## 2016-10-09 DIAGNOSIS — R109 Unspecified abdominal pain: Secondary | ICD-10-CM

## 2016-10-09 DIAGNOSIS — O365930 Maternal care for other known or suspected poor fetal growth, third trimester, not applicable or unspecified: Secondary | ICD-10-CM | POA: Diagnosis present

## 2016-10-09 DIAGNOSIS — Z981 Arthrodesis status: Secondary | ICD-10-CM

## 2016-10-09 LAB — STREP GP B NAA: Strep Gp B NAA: POSITIVE — AB

## 2016-10-09 LAB — GC/CHLAMYDIA PROBE AMP
Chlamydia trachomatis, NAA: NEGATIVE
NEISSERIA GONORRHOEAE BY PCR: NEGATIVE

## 2016-10-09 NOTE — Telephone Encounter (Signed)
Pt's mother June came by office yesterday after anesthesia consult to notify us that patient's white count was 15 and anesthesia concerned that if it rose to 20 there would be increased risk of infection of hardware in patients back located from T5-L3. Anesthesia also concerned about not being able to safely place epidural and recommends transfer to [facility]. Pt being seen by DPN Bton today but wanted to notify you if you wanted to go ahead and start transfer process since patient is 37 weeks and [facility]. Please advise. Thank you.

## 2016-10-15 ENCOUNTER — Encounter: Payer: 59 | Admitting: Advanced Practice Midwife

## 2016-10-15 ENCOUNTER — Other Ambulatory Visit: Payer: 59

## 2016-11-27 ENCOUNTER — Encounter (HOSPITAL_COMMUNITY): Payer: Self-pay

## 2016-12-14 ENCOUNTER — Ambulatory Visit
Admission: EM | Admit: 2016-12-14 | Discharge: 2016-12-14 | Disposition: A | Discharge status: Home / Self Care | Payer: 59

## 2016-12-14 ENCOUNTER — Ambulatory Visit (INDEPENDENT_AMBULATORY_CARE_PROVIDER_SITE_OTHER): Payer: 59

## 2016-12-14 ENCOUNTER — Encounter: Payer: Self-pay | Admitting: Emergency Medicine

## 2016-12-14 DIAGNOSIS — R05 Cough: Secondary | ICD-10-CM | POA: Diagnosis not present

## 2016-12-14 DIAGNOSIS — J40 Bronchitis, not specified as acute or chronic: Secondary | ICD-10-CM

## 2016-12-14 DIAGNOSIS — J069 Acute upper respiratory infection, unspecified: Secondary | ICD-10-CM

## 2016-12-14 MED ORDER — BENZONATATE 100 MG PO CAPS: 100.0000 mg | ORAL_CAPSULE | Freq: Three times a day (TID) | ORAL | 0 refills | Status: DC

## 2016-12-14 MED ORDER — ALBUTEROL SULFATE HFA 108 (90 BASE) MCG/ACT IN AERS: 1.0000 | INHALATION_SPRAY | Freq: Four times a day (QID) | RESPIRATORY_TRACT | 0 refills | Status: DC | PRN

## 2016-12-14 MED ORDER — CETIRIZINE HCL 10 MG PO TABS: 10.0000 mg | ORAL_TABLET | Freq: Every day | ORAL | 0 refills | Status: DC

## 2016-12-14 MED ORDER — LEVOFLOXACIN 500 MG PO TABS: 500.0000 mg | ORAL_TABLET | Freq: Every day | ORAL | 0 refills | Status: DC

## 2016-12-14 NOTE — Discharge Instructions (Signed)
-  Levofloxacin: one tablet daily or 7 days -Cetirizine: one tablet daily for allergies and symptoms -albuterol: 1-2 puffs every 6 hours as needed for wheezing, cough, of shortness of breath -tessalon every 8 hours for cough -fluids -follow up with PCP or urgent care if no improvement.

## 2016-12-14 NOTE — ED Provider Notes (Signed)
[facility]    CSN: [id number] Arrival date & time: 12/14/16  1008     History   Chief Complaint Chief Complaint  Patient presents with  . Cough    HPI Courtney Sanchez is a 26 y.o. female.   Patient is a 26 year old female with past medical history of asthma, irritable bowel syndrome, migraine migraines who presents with x-ray 10 day history of cold symptoms. Patient was seen by her primary care for her annual exam on the 10th of this month for similar symptoms. She was told by her doctor that should clear up in a couple days but it has not. Patient does have a history of seasonal allergies and has been seen by an allergist in the past. Per her PCPs note, he offered a return visit allergist but she declined. Patient reports headache, hot/cold chills, diarrhea, runny nose and cough. Patient reports she had diarrhea 3 yesterday. Patient denies any fever but does report some chest tightness with her cough. Patient reports her newborn child has been sick as well. Patient does report some ear pressure that started yesterday. Patient does not take any daily allergy medications. She has tried a saline nasal spray and a severe cold medication she got over-the-counter without much relief.      Past Medical History:  Diagnosis Date  . Asthma   . Endometriosis   . Endometriosis   . Hidradenitis   . Hydronephrosis   . IBS (irritable bowel syndrome)   . Migraine   . Migraine   . Pseudotumor cerebri   . Sciatica   . Scoliosis     Patient Active Problem List   Diagnosis Date Noted  . Tobacco use affecting pregnancy in third trimester, antepartum 10/03/2016  . Abdominal pain 09/18/2016  . Labor and delivery indication for care or intervention 08/30/2016  . Pregnancy 07/03/2016  . Abdominal pain affecting pregnancy 06/14/2016  . Obesity complicating pregnancy, third trimester 06/11/2016  . Supervision of high risk pregnancy, antepartum, third trimester 05/28/2016  .  History of spinal fusion 04/29/2016  . BMI 40.0-44.9, adult (HCC) 04/29/2016  . Acute left-sided low back pain with left-sided sciatica 03/27/2015  . Idiopathic scoliosis 04/20/2012  . 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  . Renal cyst 12/16/2011  . Pseudotumor cerebri 01/16/2011  . Hydronephrosis of right kidney 10/23/2010  . Intermittent asthma 10/23/2010  . Migraines 10/23/2010  . IBS (irritable bowel syndrome) 10/23/2010    Past Surgical History:  Procedure Laterality Date  . COLONOSCOPY  2001;2008  . 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 LAPAROSCOPY  04/2007  . SPINAL FUSION    . SPINAL FUSION  2008  . SPINAL FUSION    . TONSILLECTOMY      OB History    Gravida Para Term Preterm AB Living   1         0   SAB TAB Ectopic Multiple Live Births                   Home Medications    Prior to Admission medications   Medication Sig Start Date End Date Taking? Authorizing Provider  medroxyPROGESTERone (DEPO-PROVERA) 150 MG/ML injection Inject 150 mg into the muscle every 3 (three) months.   Yes [provider]  albuterol (PROVENTIL HFA;VENTOLIN HFA) 108 (90 Base) MCG/ACT inhaler Inhale 1-2 puffs into the lungs every 6 (six) hours as needed for wheezing or shortness of breath. 12/14/16  Candis Schatz, PA-C  benzonatate (TESSALON) 100 MG capsule Take 1 capsule (100 mg total) by mouth every 8 (eight) hours. 12/14/16   Candis Schatz, PA-C  cetirizine (ZYRTEC) 10 MG tablet Take 1 tablet (10 mg total) by mouth daily. 12/14/16   Candis Schatz, PA-C  levofloxacin (LEVAQUIN) 500 MG tablet Take 1 tablet (500 mg total) by mouth daily. 12/14/16   Candis Schatz, PA-C    Family History Family History  Problem Relation Age of Onset  . Cancer Mother   . Bipolar disorder Mother   . Depression Mother   . Cancer Father   . Cancer Maternal Grandmother   . Depression Maternal Grandmother     Social  History Social History  Substance Use Topics  . Smoking status: Current Some Day Smoker    Packs/day: 0.50    Types: Cigarettes  . Smokeless tobacco: Never Used  . Alcohol use No     Allergies   Amoxicillin; Cefaclor; Cefadroxil; Cefuroxime; Cephalexin; Clarithromycin; Clindamycin; Doxycycline; Erythromycin; Penicillins; Sulfamethoxazole-trimethoprim; Vitamin a; Dextromethorphan hbr; Morphine; Other; Azithromycin; and Red dye   Review of Systems Review of Systems  As noted above in history of present illness. Other systems reviewed and found negative.   Physical Exam Triage Vital Signs ED Triage Vitals  Enc Vitals Group     BP 12/14/16 1024 137/64     Pulse Rate 12/14/16 1024 93     Resp 12/14/16 1024 16     Temp 12/14/16 1024 98.1 F (36.7 C)     Temp Source 12/14/16 1024 Oral     SpO2 12/14/16 1024 100 %     Weight 12/14/16 1021 240 lb (108.9 kg)     Height 12/14/16 1021  (1.651 m)     Head Circumference --      Peak Flow --      Pain Score 12/14/16 1021 5     Pain Loc --      Pain Edu? --      Excl. in GC? --    No data found.   Updated Vital Signs BP 137/64 (BP Location: Left Arm)   Pulse 93   Temp 98.1 F (36.7 C) (Oral)   Resp 16   Ht  (1.651 m)   Wt 240 lb (108.9 kg)   LMP 12/11/2016 (Exact Date)   SpO2 100%   Breastfeeding? No   BMI 39.94 kg/m   Visual Acuity Right Eye Distance:   Left Eye Distance:   Bilateral Distance:    Right Eye Near:   Left Eye Near:    Bilateral Near:     Physical Exam  Constitutional: She is oriented to person, place, and time. She appears well-developed and well-nourished. No distress.  HENT:  Head: Normocephalic and atraumatic.  Nose: Mucosal edema and rhinorrhea present. Right sinus exhibits no maxillary sinus tenderness and no frontal sinus tenderness. Left sinus exhibits no maxillary sinus tenderness and no frontal sinus tenderness.  Mouth/Throat: Uvula is midline, oropharynx is clear and moist and  mucous membranes are normal. Tonsils are 0 on the right. Tonsils are 0 on the left.  Mild erythema to the distal ear canal on the left. Some minimal bilateral TM bulging. Some postnasal drainage.  Eyes: Pupils are equal, round, and reactive to light. EOM are normal.  Neck: Normal range of motion. Neck supple.  Cardiovascular: Normal rate, regular rhythm and normal heart sounds.   No murmur heard. Pulmonary/Chest: Effort normal. She has no wheezes.  Course breath sounds right lower lobe. Some delayed cough after forced expiration.  Abdominal: Soft.  Musculoskeletal: Normal range of motion.  Neurological: She is oriented to person, place, and time. No cranial nerve deficit.     UC Treatments / Results  Labs (all labs ordered are listed, but only abnormal results are displayed) Labs Reviewed - No data to display  EKG  EKG Interpretation None       Radiology Dg Chest 2 View  Result Date: 12/14/2016 CLINICAL DATA:  Cough. EXAM: CHEST  2 VIEW COMPARISON:  01/12/2015 FINDINGS: The heart size and mediastinal contours are within normal limits. There is no evidence of pulmonary edema, consolidation, pneumothorax, nodule or pleural fluid. Stable thoracolumbar spinal fusion rods and screws. IMPRESSION: No active cardiopulmonary disease. Electronically Signed   By: Irish Lack M.D.   On: 12/14/2016 11:12    Procedures Procedures (including critical care time)  Medications Ordered in UC Medications - No data to display   Initial Impression / Assessment and Plan / UC Course  I have reviewed the triage vital signs and the nursing notes.  Pertinent labs & imaging results that were available during my care of the patient were reviewed by me and considered in my medical decision making (see chart for details).    Patient with history of asthma and seasonal allergies who is seen by 8 hours as. She is not on a daily allergy medication which she probably needs to be. Patient diarrhea  could also be related to her irritable bowel syndrome. Will check a chest x-ray given the coarse breath sounds at the right. Cough could be related to her asthma or bronchitis.  Final Clinical Impressions(s) / UC Diagnoses   Final diagnoses:  Bronchitis    New Prescriptions New Prescriptions   ALBUTEROL (PROVENTIL HFA;VENTOLIN HFA) 108 (90 BASE) MCG/ACT INHALER    Inhale 1-2 puffs into the lungs every 6 (six) hours as needed for wheezing or shortness of breath.   BENZONATATE (TESSALON) 100 MG CAPSULE    Take 1 capsule (100 mg total) by mouth every 8 (eight) hours.   CETIRIZINE (ZYRTEC) 10 MG TABLET    Take 1 tablet (10 mg total) by mouth daily.   LEVOFLOXACIN (LEVAQUIN) 500 MG TABLET    Take 1 tablet (500 mg total) by mouth daily.   Chest x-ray negative for acute disease as above. Illness probably started with her allergies continue to worsen. With the coarse breath sounds and asthma, will go ahead treat with a course of Levaquin. We'll also add a prescription for Tessalon for her cough. Also add albuterol for a refractory cough or wheezing this patient with history of asthma. Also add a daily Zyrtec.  Controlled Substance Prescriptions [place] Controlled Substance Registry consulted? Not Applicable   Candis Schatz, PA-C    Candis Schatz, PA-C 12/14/16 1122

## 2016-12-14 NOTE — ED Triage Notes (Signed)
Patient c/o cough, chest congestion, and nasal congestion for 5 days.

## 2017-02-03 ENCOUNTER — Ambulatory Visit: Payer: 59

## 2017-02-04 ENCOUNTER — Telehealth: Payer: Self-pay

## 2017-02-04 ENCOUNTER — Other Ambulatory Visit: Payer: Self-pay

## 2017-02-04 ENCOUNTER — Ambulatory Visit: Payer: 59

## 2017-02-04 MED ORDER — MEDROXYPROGESTERONE ACETATE 150 MG/ML IM SUSP: 150.0000 mg | INTRAMUSCULAR | 4 refills | Status: DC

## 2017-02-04 NOTE — Telephone Encounter (Signed)
Please call pt and get new appt for depo sheduled. I refilled to last until her annaul due next year. [facility] someone here was refilling medication. Thank you . [phone]

## 2017-02-05 ENCOUNTER — Ambulatory Visit (INDEPENDENT_AMBULATORY_CARE_PROVIDER_SITE_OTHER): Payer: 59

## 2017-02-05 DIAGNOSIS — Z3042 Encounter for surveillance of injectable contraceptive: Secondary | ICD-10-CM

## 2017-02-05 MED ORDER — MEDROXYPROGESTERONE ACETATE 150 MG/ML IM SUSP
150.0000 mg | Freq: Once | INTRAMUSCULAR | Status: AC
  Administered 2017-02-05: 150 mg via INTRAMUSCULAR

## 2017-03-27 ENCOUNTER — Encounter: Payer: Self-pay | Admitting: Obstetrics & Gynecology

## 2017-03-27 ENCOUNTER — Ambulatory Visit (INDEPENDENT_AMBULATORY_CARE_PROVIDER_SITE_OTHER): Payer: 59 | Admitting: Obstetrics & Gynecology

## 2017-03-27 VITALS — BP 120/80 | HR 86 | Ht 65.0 in | Wt 261.0 lb

## 2017-03-27 DIAGNOSIS — Z124 Encounter for screening for malignant neoplasm of cervix: Secondary | ICD-10-CM | POA: Diagnosis not present

## 2017-03-27 DIAGNOSIS — N939 Abnormal uterine and vaginal bleeding, unspecified: Secondary | ICD-10-CM | POA: Diagnosis not present

## 2017-03-27 NOTE — Progress Notes (Signed)
HPI:      [name] is a 27 y.o. G1P1001 who LMP was Patient's last menstrual period was 03/09/2017., presents today for a problem visit.  She complains of irreg bleeding this month that  began about a month ago and its severity is described as moderate.  She has had no periods on Depo since NSVD Sept and Depo shots Sept 5 and Dec 10;  When she has them  they are associated with moderate menstrual cramping.  She has used the following for attempts at control: maxi pad.  Dyspareunia this month as well. Prior h/o endometriosis.  Previous evaluation: none. Prior Diagnosis: never had bleeding abnormality before. Previous Treatment: none.  She is single partner, contraception - Depo-Provera injections.  Contraception: Depo-Provera injections. Hx of STDs: none. She is premenopausal.  PMHx: She  has a past medical history of Asthma, Endometriosis, Endometriosis, Hidradenitis, Hydronephrosis, IBS (irritable bowel syndrome), Migraine, Migraine, Pseudotumor cerebri, Sciatica, and Scoliosis. Also,  has a past surgical history that includes Spinal fusion; Tonsillectomy; Colonoscopy (5409;8119); laparoscopy (04/2007); Spinal fusion (2008); Kidney surgery (Right, 2012); and Spinal fusion., family history includes Bipolar disorder in her mother; Cancer in her father, maternal grandmother, and mother; Depression in her maternal grandmother and mother.,  reports that she has been smoking cigarettes.  She has been smoking about 0.50 packs per day. she has never used smokeless tobacco. She reports that she does not drink alcohol or use drugs.  She  Current Outpatient Medications:  .  medroxyPROGESTERone (DEPO-PROVERA) 150 MG/ML injection, Inject 1 mL (150 mg total) into the muscle every 3 (three) months., Disp: 1 mL, Rfl: 4 .  albuterol (PROVENTIL HFA;VENTOLIN HFA) 108 (90 Base) MCG/ACT inhaler, Inhale 1-2 puffs into the lungs every 6 (six) hours as needed for wheezing or shortness of breath., Disp: 1  Inhaler, Rfl: 0 .  benzonatate (TESSALON) 100 MG capsule, Take 1 capsule (100 mg total) by mouth every 8 (eight) hours., Disp: 21 capsule, Rfl: 0 .  cetirizine (ZYRTEC) 10 MG tablet, Take 1 tablet (10 mg total) by mouth daily., Disp: 30 tablet, Rfl: 0 .  HUMIRA PEN 40 MG/0.4ML PNKT, , Disp: , Rfl:  .  levofloxacin (LEVAQUIN) 500 MG tablet, Take 1 tablet (500 mg total) by mouth daily., Disp: 7 tablet, Rfl: 0  Also, is allergic to amoxicillin; cefaclor; cefadroxil; cefuroxime; cephalexin; clarithromycin; clindamycin; doxycycline; erythromycin; penicillins; sulfamethoxazole-trimethoprim; vitamin a; dextromethorphan hbr; morphine; other; azithromycin; and red dye.  Review of Systems  Constitutional: Negative for chills, fever and malaise/fatigue.  HENT: Negative for congestion, sinus pain and sore throat.   Eyes: Negative for blurred vision and pain.  Respiratory: Negative for cough and wheezing.   Cardiovascular: Negative for chest pain and leg swelling.  Gastrointestinal: Negative for abdominal pain, constipation, diarrhea, heartburn, nausea and vomiting.  Genitourinary: Negative for dysuria, frequency, hematuria and urgency.  Musculoskeletal: Negative for back pain, joint pain, myalgias and neck pain.  Skin: Negative for itching and rash.  Neurological: Negative for dizziness, tremors and weakness.  Endo/Heme/Allergies: Does not bruise/bleed easily.  Psychiatric/Behavioral: Negative for depression. The patient is not nervous/anxious and does not have insomnia.     Objective: BP 120/80   Pulse 86   Ht 5\' 5"  (1.651 m)   Wt 261 lb (118.4 kg)   LMP 03/09/2017   BMI 43.43 kg/m  Physical Exam  Constitutional: She is oriented to person, place, and time. She appears well-developed and well-nourished. No distress.  Genitourinary: Vagina normal and uterus normal. Pelvic exam  was performed with patient supine. There is no rash, tenderness or lesion on the right labia. There is no rash, tenderness  or lesion on the left labia. No erythema or bleeding in the vagina. Right adnexum does not display mass and does not display tenderness. Left adnexum does not display mass and does not display tenderness. Cervix does not exhibit motion tenderness, discharge, polyp or nabothian cyst.   Uterus is mobile and midaxial. Uterus is not enlarged or exhibiting a mass.  Abdominal: Soft. She exhibits no distension. There is no tenderness.  Musculoskeletal: Normal range of motion.  Neurological: She is alert and oriented to person, place, and time. No cranial nerve deficit.  Skin: Skin is warm and dry.  Psychiatric: She has a normal mood and affect.    ASSESSMENT/PLAN:  dysfunctional uterine bleeding  Problem List Items Addressed This Visit    None    Visit Diagnoses    Abnormal uterine bleeding    -  Primary   Relevant Orders   US PELVIS TRANSVANGINAL NON-OB (TV ONLY)   Screening for cervical cancer       Relevant Orders   IGP, CtNg, rfx Aptima HPV ASCU    Pain and bleeding a concern.  US to assess.  May be hormone related.  Has been on Depo for years, wishes to continue.   Annamarie MajorPaul Vedant Shehadeh, MD, Merlinda FrederickFACOG [facility], [facility] 03/27/2017  4:36 PM

## 2017-03-31 ENCOUNTER — Other Ambulatory Visit: Payer: 59

## 2017-03-31 ENCOUNTER — Ambulatory Visit: Payer: 59 | Admitting: Obstetrics & Gynecology

## 2017-03-31 LAB — IGP, CTNG, RFX APTIMA HPV ASCU
Chlamydia, Nuc. Acid Amp: NEGATIVE
Gonococcus by Nucleic Acid Amp: NEGATIVE
PAP SMEAR COMMENT: 0

## 2017-04-05 ENCOUNTER — Other Ambulatory Visit: Payer: Self-pay

## 2017-04-05 ENCOUNTER — Emergency Department: Payer: 59

## 2017-04-05 ENCOUNTER — Emergency Department
Admission: EM | Admit: 2017-04-05 | Discharge: 2017-04-06 | Disposition: A | Discharge status: Home / Self Care | Payer: 59 | Attending: Emergency Medicine | Admitting: Emergency Medicine

## 2017-04-05 ENCOUNTER — Encounter: Payer: Self-pay | Admitting: Emergency Medicine

## 2017-04-05 DIAGNOSIS — R6889 Other general symptoms and signs: Secondary | ICD-10-CM

## 2017-04-05 DIAGNOSIS — R05 Cough: Secondary | ICD-10-CM | POA: Diagnosis present

## 2017-04-05 DIAGNOSIS — J45909 Unspecified asthma, uncomplicated: Secondary | ICD-10-CM | POA: Diagnosis not present

## 2017-04-05 DIAGNOSIS — Z79899 Other long term (current) drug therapy: Secondary | ICD-10-CM | POA: Insufficient documentation

## 2017-04-05 DIAGNOSIS — J189 Pneumonia, unspecified organism: Secondary | ICD-10-CM | POA: Diagnosis not present

## 2017-04-05 DIAGNOSIS — J181 Lobar pneumonia, unspecified organism: Secondary | ICD-10-CM

## 2017-04-05 DIAGNOSIS — F17210 Nicotine dependence, cigarettes, uncomplicated: Secondary | ICD-10-CM | POA: Insufficient documentation

## 2017-04-05 MED ORDER — ONDANSETRON 4 MG PO TBDP
4.0000 mg | ORAL_TABLET | Freq: Once | ORAL | Status: AC
  Administered 2017-04-05: 4 mg via ORAL
  Filled 2017-04-05: qty 1

## 2017-04-05 MED ORDER — ALBUTEROL SULFATE (2.5 MG/3ML) 0.083% IN NEBU
2.5000 mg | INHALATION_SOLUTION | Freq: Once | RESPIRATORY_TRACT | Status: AC
  Administered 2017-04-05: 2.5 mg via RESPIRATORY_TRACT
  Filled 2017-04-05: qty 3

## 2017-04-05 MED ORDER — ONDANSETRON HCL 4 MG/2ML IJ SOLN: 4.0000 mg | Freq: Once | INTRAMUSCULAR | Status: DC

## 2017-04-05 MED ORDER — LEVOFLOXACIN 750 MG PO TABS: 750.0000 mg | ORAL_TABLET | Freq: Every day | ORAL | 0 refills | Status: AC

## 2017-04-05 MED ORDER — ALBUTEROL SULFATE HFA 108 (90 BASE) MCG/ACT IN AERS: 2.0000 | INHALATION_SPRAY | RESPIRATORY_TRACT | 0 refills | Status: DC | PRN

## 2017-04-05 MED ORDER — LEVOFLOXACIN 500 MG PO TABS
500.0000 mg | ORAL_TABLET | Freq: Once | ORAL | Status: AC
  Administered 2017-04-05: 500 mg via ORAL
  Filled 2017-04-05: qty 1

## 2017-04-05 MED ORDER — PREDNISONE 50 MG PO TABS: 50.0000 mg | ORAL_TABLET | Freq: Every day | ORAL | 0 refills | Status: DC

## 2017-04-05 MED ORDER — ONDANSETRON 4 MG PO TBDP: 4.0000 mg | ORAL_TABLET | Freq: Three times a day (TID) | ORAL | 0 refills | Status: DC | PRN

## 2017-04-05 MED ORDER — BENZONATATE 100 MG PO CAPS: 100.0000 mg | ORAL_CAPSULE | Freq: Four times a day (QID) | ORAL | 0 refills | Status: AC | PRN

## 2017-04-05 NOTE — ED Notes (Signed)
Pt given mask at registration; c/o flu like symptoms

## 2017-04-05 NOTE — ED Provider Notes (Signed)
[facility] Emergency Department Provider Note  ____________________________________________  Time seen: Approximately 10:53 PM  I have reviewed the triage vital signs and the nursing notes.   HISTORY  Chief Complaint Cough; Nasal Congestion; and Generalized Body Aches    HPI Courtney Sanchez is a 27 y.o. female who presents the emergency department complaining of cough, congestion, body aches, fevers and chills.  Patient reports that symptoms started 4 days ago, however in the last 24 hours patient states, I feel like I got hit by a train."  Patient reports that she is a smoker and has a repeat history of pneumonia.  Symptoms are similar to same.  Patient denies any headache, visual changes, neck pain or stiffness, difficulty breathing, abdominal pain.  Patient has had some posttussive emesis but no emesis at baseline.  No diarrhea or constipation.  Patient is tried multiple over-the-counter medications for her cough with no improvement.  No other complaints at this time.  Past Medical History:  Diagnosis Date  . Asthma   . Endometriosis   . Endometriosis   . Hidradenitis   . Hydronephrosis   . IBS (irritable bowel syndrome)   . Migraine   . Migraine   . Pseudotumor cerebri   . Sciatica   . Scoliosis     Patient Active Problem List   Diagnosis Date Noted  . Tobacco use affecting pregnancy in third trimester, antepartum 10/03/2016  . Abdominal pain 09/18/2016  . Labor and delivery indication for care or intervention 08/30/2016  . Pregnancy 07/03/2016  . Abdominal pain affecting pregnancy 06/14/2016  . Obesity complicating pregnancy, third trimester 06/11/2016  . Supervision of high risk pregnancy, antepartum, third trimester 05/28/2016  . History of spinal fusion 04/29/2016  . BMI 40.0-44.9, adult (HCC) 04/29/2016  . Acute left-sided low back pain with left-sided sciatica 03/27/2015  . Idiopathic scoliosis 04/20/2012  . H/O partial nephrectomy  12/29/2011  . Renal cyst 12/16/2011  . Pseudotumor cerebri 01/16/2011  . Hydronephrosis of right kidney 10/23/2010  . Intermittent asthma 10/23/2010  . Migraines 10/23/2010  . IBS (irritable bowel syndrome) 10/23/2010    Past Surgical History:  Procedure Laterality Date  . COLONOSCOPY  2001;2008  . 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 LAPAROSCOPY  04/2007  . SPINAL FUSION    . SPINAL FUSION  2008  . SPINAL FUSION    . TONSILLECTOMY      Prior to Admission medications   Medication Sig Start Date End Date Taking? Authorizing Provider  albuterol (PROVENTIL HFA;VENTOLIN HFA) 108 (90 Base) MCG/ACT inhaler Inhale 2 puffs into the lungs every 4 (four) hours as needed for wheezing or shortness of breath. 04/05/17   Courtney Sanchez, Delorise Royals, PA-C  benzonatate (TESSALON PERLES) 100 MG capsule Take 1 capsule (100 mg total) by mouth every 6 (six) hours as needed for cough. 04/05/17 04/05/18  Courtney Sanchez, Delorise Royals, PA-C  cetirizine (ZYRTEC) 10 MG tablet Take 1 tablet (10 mg total) by mouth daily. 12/14/16   Courtney Schatz, PA-C  HUMIRA PEN 40 MG/0.4ML PNKT  02/27/17   [provider]  levofloxacin (LEVAQUIN) 750 MG tablet Take 1 tablet (750 mg total) by mouth daily for 10 days. 04/05/17 04/15/17  Courtney Sanchez, Delorise Royals, PA-C  medroxyPROGESTERone (DEPO-PROVERA) 150 MG/ML injection Inject 1 mL (150 mg total) into the muscle every 3 (three) months. 02/04/17   Courtney Novak, MD  ondansetron (ZOFRAN-ODT) 4 MG disintegrating tablet Take 1  tablet (4 mg total) by mouth every 8 (eight) hours as needed for nausea or vomiting. 04/05/17   Courtney Sanchez, Delorise RoyalsJonathan D, PA-C  predniSONE (DELTASONE) 50 MG tablet Take 1 tablet (50 mg total) by mouth daily with breakfast. 04/05/17   Courtney Sanchez, Delorise RoyalsJonathan D, PA-C    Allergies Amoxicillin; Cefaclor; Cefadroxil; Cefuroxime; Cephalexin; Clarithromycin; Clindamycin; Doxycycline; Erythromycin; Penicillins;  Sulfamethoxazole-trimethoprim; Vitamin a; Dextromethorphan hbr; Morphine; Other; Azithromycin; and Red dye  Family History  Problem Relation Age of Onset  . Cancer Mother   . Bipolar disorder Mother   . Depression Mother   . Cancer Father   . Cancer Maternal Grandmother   . Depression Maternal Grandmother     Social History Social History   Tobacco Use  . Smoking status: Current Some Day Smoker    Packs/day: 0.50    Types: Cigarettes  . Smokeless tobacco: Never Used  Substance Use Topics  . Alcohol use: No  . Drug use: No     Review of Systems  Constitutional: Low-grade fever/chills Eyes: No visual changes. No discharge ENT: Positive for nasal congestion. Cardiovascular: no chest pain. Respiratory: Positive for significant cough. No SOB. Gastrointestinal: No abdominal pain.  Patient had one episode of posttussive emesis..  No diarrhea.  No constipation. Musculoskeletal: Positive for bilateral rib pain from coughing. Skin: Negative for rash, abrasions, lacerations, ecchymosis. Neurological: Negative for headaches, focal weakness or numbness. 10-point ROS otherwise negative.  ____________________________________________   PHYSICAL EXAM:  VITAL SIGNS: ED Triage Vitals  Enc Vitals Group     BP 04/05/17 2122 (!) 142/73     Pulse Rate 04/05/17 2122 (!) 110     Resp 04/05/17 2122 (!) 22     Temp 04/05/17 2122 99.1 F (37.3 C)     Temp src --      SpO2 04/05/17 2122 98 %     Weight 04/05/17 2121 240 lb (108.9 kg)     Height 04/05/17 2121 5\' 5"  (1.651 m)     Head Circumference --      Peak Flow --      Pain Score 04/05/17 2121 7     Pain Loc --      Pain Edu? --      Excl. in GC? --      Constitutional: Alert and oriented. Well appearing and in no acute distress. Eyes: Conjunctivae are normal. PERRL. EOMI. Head: Atraumatic. ENT:      Ears: EACs and TMs unremarkable bilaterally.      Nose: Moderate congestion/rhinnorhea.      Mouth/Throat: Mucous membranes  are moist.  Oropharynx is mildly erythematous but nonedematous.  Uvula is midline. Neck: No stridor.  Neck is supple full range of motion Hematological/Lymphatic/Immunilogical: No cervical lymphadenopathy. Cardiovascular: Normal rate, regular rhythm. Normal S1 and S2.  Good peripheral circulation. Respiratory: Normal respiratory effort without tachypnea or retractions. Lungs diffuse expiratory wheezing, crackles bilateral lower lung fields, mild rales to the left lower lung field.Peri [place]. Good air entry to the bases with no decreased or absent breath sounds. Musculoskeletal: Full range of motion to all extremities. No gross deformities appreciated. Neurologic:  Normal speech and language. No gross focal neurologic deficits are appreciated.  Skin:  Skin is warm, dry and intact. No rash noted. Psychiatric: Mood and affect are normal. Speech and behavior are normal. Patient exhibits appropriate insight and judgement.   ____________________________________________   LABS (all labs ordered are listed, but only abnormal results are displayed)  Labs Reviewed - No data to display ____________________________________________  EKG  ____________________________________________  RADIOLOGY Festus Barren Jaecion Dempster, personally viewed and evaluated these images (plain radiographs) as part of my medical decision making, as well as reviewing the written report by the radiologist.  Dg Chest 2 View  Result Date: 04/05/2017 CLINICAL DATA:  Cough and rib pain EXAM: CHEST  2 VIEW COMPARISON:  12/14/2016 FINDINGS: Stable fixating rods and screws in the spine. Increased interstitial opacity within the bilateral lungs. Focal airspace disease within the left mid lung. Normal heart size. No pleural effusion. IMPRESSION: 1. Central airways thickening with focal airspace disease in the left mid lung suspicious for pneumonia. Electronically Signed   By: Jasmine Pang M.Sanchez.   On: 04/05/2017 22:52     ____________________________________________    PROCEDURES  Procedure(s) performed:    Procedures    Medications  albuterol (PROVENTIL) (2.5 MG/3ML) 0.083% nebulizer solution 2.5 mg (2.5 mg Nebulization Given 04/05/17 2320)  albuterol (PROVENTIL) (2.5 MG/3ML) 0.083% nebulizer solution 2.5 mg (2.5 mg Nebulization Given 04/05/17 2320)  levofloxacin (LEVAQUIN) tablet 500 mg (500 mg Oral Given 04/05/17 2325)  ondansetron (ZOFRAN-ODT) disintegrating tablet 4 mg (4 mg Oral Given 04/05/17 2325)     ____________________________________________   INITIAL IMPRESSION / ASSESSMENT AND PLAN / ED COURSE  Pertinent labs & imaging results that were available during my care of the patient were reviewed by me and considered in my medical decision making (see chart for details).  Review of the [place] CSRS was performed in accordance of the [facility] prior to dispensing any controlled drugs.     Patient's diagnosis is consistent with community-acquired pneumonia of the left lower lobe and influenza symptoms.  Patient presents to the emergency department with sudden onset of worsening of symptoms today.  Chest x-ray reveals left lower lobe pneumonia consistent with physical exam findings.  Patient is given albuterol, Solu-Medrol, Levaquin in the emergency department with improvement in symptoms.. Patient will be discharged home with prescriptions for butyryl inhaler, prednisone, Levaquin, Tessalon Perles, Zofran.. Patient is to follow up with primary care as needed or otherwise directed. Patient is given ED precautions to return to the ED for any worsening or new symptoms.     ____________________________________________  FINAL CLINICAL IMPRESSION(S) / ED DIAGNOSES  Final diagnoses:  Community acquired pneumonia of left lower lobe of lung (HCC)  Flu-like symptoms      NEW MEDICATIONS STARTED DURING THIS VISIT:  ED Discharge Orders        Ordered    albuterol (PROVENTIL HFA;VENTOLIN HFA) 108 (90  Base) MCG/ACT inhaler  Every 4 hours PRN     04/05/17 2308    predniSONE (DELTASONE) 50 MG tablet  Daily with breakfast     04/05/17 2308    levofloxacin (LEVAQUIN) 750 MG tablet  Daily     04/05/17 2308    benzonatate (TESSALON PERLES) 100 MG capsule  Every 6 hours PRN     04/05/17 2308    ondansetron (ZOFRAN-ODT) 4 MG disintegrating tablet  Every 8 hours PRN     04/05/17 2308          This chart was dictated using voice recognition software/Dragon. Despite best efforts to proofread, errors can occur which can change the meaning. Any change was purely unintentional.    Racheal Patches, PA-C 04/05/17 2335    Myrna Blazer, MD 04/08/17 1310

## 2017-04-05 NOTE — ED Triage Notes (Signed)
Patient with complaint of cough, congestion and body aches that started Thursday night. Patient with complaint of rib pain from cough.

## 2017-04-30 ENCOUNTER — Ambulatory Visit: Payer: 59

## 2017-07-27 ENCOUNTER — Emergency Department
Admission: EM | Admit: 2017-07-27 | Discharge: 2017-07-27 | Disposition: A | Discharge status: Home / Self Care | Payer: 59 | Attending: Emergency Medicine | Admitting: Emergency Medicine

## 2017-07-27 ENCOUNTER — Other Ambulatory Visit: Payer: Self-pay

## 2017-07-27 DIAGNOSIS — R51 Headache: Secondary | ICD-10-CM | POA: Diagnosis not present

## 2017-07-27 DIAGNOSIS — F17210 Nicotine dependence, cigarettes, uncomplicated: Secondary | ICD-10-CM | POA: Diagnosis not present

## 2017-07-27 DIAGNOSIS — J45909 Unspecified asthma, uncomplicated: Secondary | ICD-10-CM | POA: Insufficient documentation

## 2017-07-27 DIAGNOSIS — R198 Other specified symptoms and signs involving the digestive system and abdomen: Secondary | ICD-10-CM | POA: Diagnosis not present

## 2017-07-27 DIAGNOSIS — Z79899 Other long term (current) drug therapy: Secondary | ICD-10-CM | POA: Diagnosis not present

## 2017-07-27 DIAGNOSIS — R519 Headache, unspecified: Secondary | ICD-10-CM

## 2017-07-27 MED ORDER — DIPHENHYDRAMINE HCL 50 MG/ML IJ SOLN
25.0000 mg | Freq: Once | INTRAMUSCULAR | Status: AC
  Administered 2017-07-27: 25 mg via INTRAVENOUS
  Filled 2017-07-27: qty 1

## 2017-07-27 MED ORDER — ACETAMINOPHEN 325 MG PO TABS
650.0000 mg | ORAL_TABLET | Freq: Once | ORAL | Status: AC
  Administered 2017-07-27: 650 mg via ORAL
  Filled 2017-07-27: qty 2

## 2017-07-27 MED ORDER — METHYLPREDNISOLONE SODIUM SUCC 125 MG IJ SOLR
125.0000 mg | Freq: Once | INTRAMUSCULAR | Status: AC
  Administered 2017-07-27: 125 mg via INTRAVENOUS
  Filled 2017-07-27: qty 2

## 2017-07-27 MED ORDER — METRONIDAZOLE 500 MG PO TABS: 500.0000 mg | ORAL_TABLET | Freq: Two times a day (BID) | ORAL | 0 refills | Status: AC

## 2017-07-27 MED ORDER — BACITRACIN ZINC 500 UNIT/GM EX OINT: TOPICAL_OINTMENT | CUTANEOUS | 0 refills | Status: AC

## 2017-07-27 MED ORDER — METOCLOPRAMIDE HCL 5 MG/ML IJ SOLN
10.0000 mg | Freq: Once | INTRAMUSCULAR | Status: AC
Start: 2017-07-27 — End: 2017-07-27
  Administered 2017-07-27: 10 mg via INTRAVENOUS
  Filled 2017-07-27: qty 2

## 2017-07-27 NOTE — ED Triage Notes (Signed)
Reports earlier tonight noticed drainage and blood coming from belly button.  States then right upper arm pain and now with a headache.

## 2017-07-27 NOTE — ED Notes (Signed)
Pt reports having "red" drainage coming from belly button PTA.  Pt states she used wound cleanser from work to clean it.  Area not red, nor warm to the touch.  Belly button has some clear drainage noted.  Pt is A&Ox4, in NAD.  Pt states afterwards her R arm started to hurt and then she started to get migraine.  Upon entering room, pt and friend both sleeping in the bed.

## 2017-07-27 NOTE — Discharge Instructions (Addendum)
No discharge was noted on the exam today.  Given the clinical picture and your complaints I will treat you for an infection.  Since you have a significant amount of antibiotic allergies my choices are limited but I will give you an ointment and a pill.  Please follow-up with your primary care physician.

## 2017-07-27 NOTE — ED Notes (Signed)
Pt discharged to home.  Family member driving.  Discharge instructions reviewed.  Verbalized understanding.  No questions or concerns at this time.  Teach back verified.  Pt in NAD.  No items left in ED.   

## 2017-07-27 NOTE — ED Provider Notes (Signed)
[facility] Emergency Department Provider Note   ____________________________________________   First MD Initiated Contact with Patient 07/27/17 [phone]     (approximate)  I have reviewed the triage vital signs and the nursing notes.   HISTORY  Chief Complaint Multiple Complaints    HPI Courtney Sanchez is a 27 y.o. female who comes into the hospital today with multiple complaints.  The patient states that she is had some pus and blood coming out of her bellybutton.  She reports that she started then having some pain in her  right arm and then developed a headache.  The symptoms started about a week ago.  The patient states that she noticed some pus coming out of her bellybutton.  She started using wound cleanser to clean it and she states that it got better.  The patient then states that she noticed blood coming out of her bellybutton today and then she started having some arm and head pain.  The patient does have a history of migraines but she was concerned so she came to get checked out.  She does have a foul smell as well coming out of her bellybutton.  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  The patient rates her pain a 7 out of 10 in intensity.  She reports that the arm pain does not change with movement and the headache feels like her typical migraines to go down the back of her neck.  The patient has not had any fevers, nausea, vomiting, diarrhea.  The patient is here today for evaluation.   Past Medical History:  Diagnosis Date  . Asthma   . Endometriosis   . Endometriosis   . Hidradenitis   . Hydronephrosis   . IBS (irritable bowel syndrome)   . Migraine   . Migraine   . Pseudotumor cerebri   . Sciatica   . Scoliosis     Patient Active Problem List   Diagnosis Date Noted  . Tobacco use affecting pregnancy in third trimester, antepartum 10/03/2016  . Abdominal pain 09/18/2016  . Labor and  delivery indication for care or intervention 08/30/2016  . Pregnancy 07/03/2016  . Abdominal pain affecting pregnancy 06/14/2016  . Obesity complicating pregnancy, third trimester 06/11/2016  . Supervision of high risk pregnancy, antepartum, third trimester 05/28/2016  . History of spinal fusion 04/29/2016  . BMI 40.0-44.9, adult (HCC) 04/29/2016  . Acute left-sided low back pain with left-sided sciatica 03/27/2015  . Idiopathic scoliosis 04/20/2012  . 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  . Renal cyst 12/16/2011  . Pseudotumor cerebri 01/16/2011  . Hydronephrosis of right kidney 10/23/2010  . Intermittent asthma 10/23/2010  . Migraines 10/23/2010  . IBS (irritable bowel syndrome) 10/23/2010    Past Surgical History:  Procedure Laterality Date  . COLONOSCOPY  2001;2008  . 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 LAPAROSCOPY  04/2007  . SPINAL FUSION    . SPINAL FUSION  2008  . SPINAL FUSION    . TONSILLECTOMY      Prior to Admission medications   Medication Sig Start Date End Date Taking? Authorizing Provider  albuterol (PROVENTIL HFA;VENTOLIN HFA) 108 (90 Base) MCG/ACT inhaler Inhale 2 puffs into the lungs every 4 (four) hours as needed for wheezing or shortness of breath. 04/05/17   Cuthriell, Delorise Royals, PA-C  bacitracin ointment Apply to affected area twice daily x 7 days 07/27/17  07/27/18  Rebecka Apley, MD  benzonatate (TESSALON PERLES) 100 MG capsule Take 1 capsule (100 mg total) by mouth every 6 (six) hours as needed for cough. 04/05/17 04/05/18  Cuthriell, Delorise Royals, PA-C  cetirizine (ZYRTEC) 10 MG tablet Take 1 tablet (10 mg total) by mouth daily. 12/14/16   Candis Schatz, PA-C  HUMIRA PEN 40 MG/0.4ML PNKT  02/27/17   [provider]  medroxyPROGESTERone (DEPO-PROVERA) 150 MG/ML injection Inject 1 mL (150 mg total) into the muscle every 3 (three) months. 02/04/17   Conard Novak, MD  metroNIDAZOLE  (FLAGYL) 500 MG tablet Take 1 tablet (500 mg total) by mouth 2 (two) times daily for 7 days. 07/27/17 08/03/17  Rebecka Apley, MD  ondansetron (ZOFRAN-ODT) 4 MG disintegrating tablet Take 1 tablet (4 mg total) by mouth every 8 (eight) hours as needed for nausea or vomiting. 04/05/17   Cuthriell, Delorise Royals, PA-C  predniSONE (DELTASONE) 50 MG tablet Take 1 tablet (50 mg total) by mouth daily with breakfast. 04/05/17   Cuthriell, Delorise Royals, PA-C    Allergies Amoxicillin; Cefaclor; Cefadroxil; Cefuroxime; Cephalexin; Clarithromycin; Clindamycin; Doxycycline; Erythromycin; Penicillins; Sulfamethoxazole-trimethoprim; Vitamin a; Dextromethorphan hbr; Morphine; Other; Azithromycin; and Red dye  Family History  Problem Relation Age of Onset  . Cancer Mother   . Bipolar disorder Mother   . Depression Mother   . Cancer Father   . Cancer Maternal Grandmother   . Depression Maternal Grandmother     Social History Social History   Tobacco Use  . Smoking status: Current Some Day Smoker    Packs/day: 0.50    Types: Cigarettes  . Smokeless tobacco: Never Used  Substance Use Topics  . Alcohol use: No  . Drug use: No    Review of Systems  Constitutional: No fever/chills Eyes: No visual changes. ENT: No sore throat. Cardiovascular: Denies chest pain. Respiratory: Denies shortness of breath. Gastrointestinal: No abdominal pain.  No nausea, no vomiting.   Genitourinary: Negative for dysuria. Musculoskeletal: Negative for back pain. Skin: Discharge from bellybutton. Neurological: Negative for headaches  ____________________________________________   PHYSICAL EXAM:  VITAL SIGNS: ED Triage Vitals  Enc Vitals Group     BP 07/27/17 0111 136/87     Pulse Rate 07/27/17 0111 94     Resp 07/27/17 0111 18     Temp 07/27/17 0111 98.8 F (37.1 C)     Temp Source 07/27/17 0111 Oral     SpO2 07/27/17 0111 99 %     Weight 07/27/17 0109 240 lb (108.9 kg)     Height 07/27/17 0109  (1.651 m)      Head Circumference --      Peak Flow --      Pain Score 07/27/17 0108 4     Pain Loc --      Pain Edu? --      Excl. in GC? --     Constitutional: Alert and oriented. Well appearing and in moderate distress. Eyes: Conjunctivae are normal. PERRL. EOMI. Head: Atraumatic. Nose: No congestion/rhinnorhea. Mouth/Throat: Mucous membranes are moist.  Oropharynx non-erythematous. Cardiovascular: Normal rate, regular rhythm. Grossly normal heart sounds.  Good peripheral circulation. Respiratory: Normal respiratory effort.  No retractions. Lungs CTAB. Gastrointestinal: Soft and nontender. No distention.  Positive bowel sounds Musculoskeletal: No lower extremity tenderness nor edema.  Neurologic:  Normal speech and language.  Skin:  Skin is warm, dry and intact.  No discharge from bellybutton, no active bleeding, and ability to express purulent material from bellybutton Psychiatric:  Mood and affect are normal.   ____________________________________________   LABS (all labs ordered are listed, but only abnormal results are displayed)  Labs Reviewed - No data to display ____________________________________________  EKG  none ____________________________________________  RADIOLOGY  ED MD interpretation:  none  Official radiology report(s): No results found.  ____________________________________________   PROCEDURES  Procedure(s) performed: None  Procedures  Critical Care performed: No  ____________________________________________   INITIAL IMPRESSION / ASSESSMENT AND PLAN / ED COURSE  As part of my medical decision making, I reviewed the following data within the electronic MEDICAL RECORD NUMBER Notes from prior ED visits and [place] Controlled Substance Database   This is a 27 year old female who comes into the hospital today stating that she is had some discharge from her bellybutton.  She reports that it looks like pus and blood.  The patient is also had some right arm  pain as well as a headache.  I did attempt to express material from the patient's bellybutton but it was unsuccessful.  I did give the patient a dose of Solu-Medrol, Benadryl and Reglan.  I will reassess the patient when she is received her medication.  My plan was to give the patient some antibiotics to treat a possible infection but she is allergic to a significant amount of antibiotics.  She will be reassessed.  After the medication the patient's headache is improved.  She will be discharged home and encouraged to follow-up with her primary care physician.  I will discharge the patient with some bacitracin ointment and metronidazole given the limited antibiotic choices.      ____________________________________________   FINAL CLINICAL IMPRESSION(S) / ED DIAGNOSES  Final diagnoses:  Acute nonintractable headache, unspecified headache type  Umbilical discharge     ED Discharge Orders        Ordered    bacitracin ointment     07/27/17 0615    metroNIDAZOLE (FLAGYL) 500 MG tablet  2 times daily     07/27/17 0615       Note:  This document was prepared using Dragon voice recognition software and may include unintentional dictation errors.    Rebecka Apley, MD 07/27/17 [phone]

## 2017-10-04 IMAGING — US US OB TRANSVAGINAL
1 series · 13 of 28 positions shown · non-contrast
Comparison: None.

CLINICAL DATA: Pregnant patient in first-trimester pregnancy with
left pelvic pain for 2 days.



[Series 1: us ob transvaginal · 0.23mm/px · 13 of 100 slices shown]
[im 4/100]
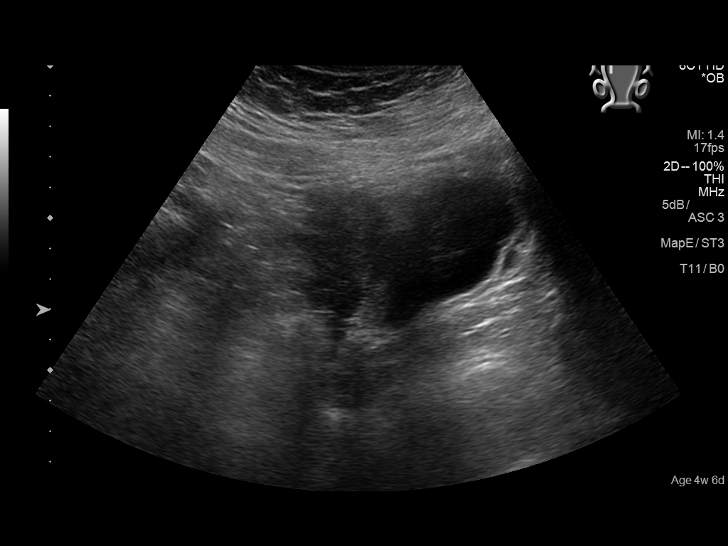
[im 12/100]
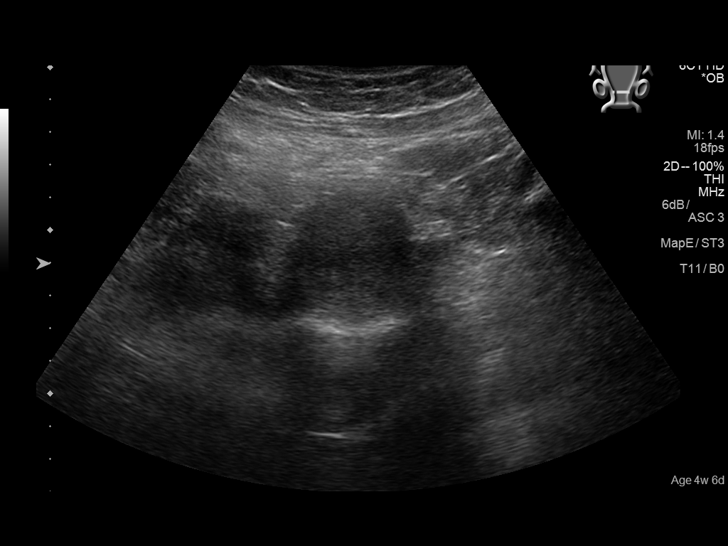
[im 19/100]
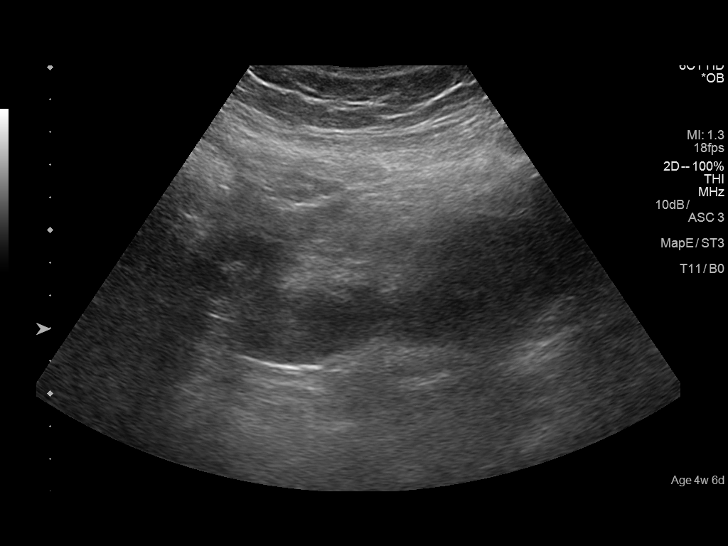
[im 26/100]
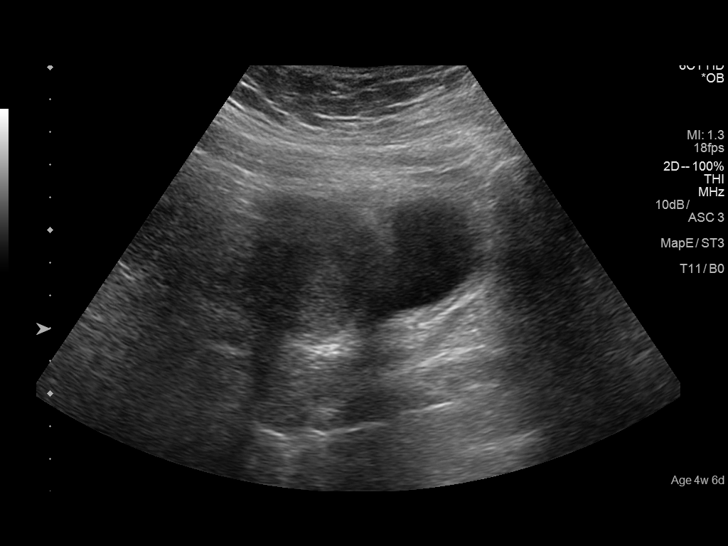
[im 34/100]
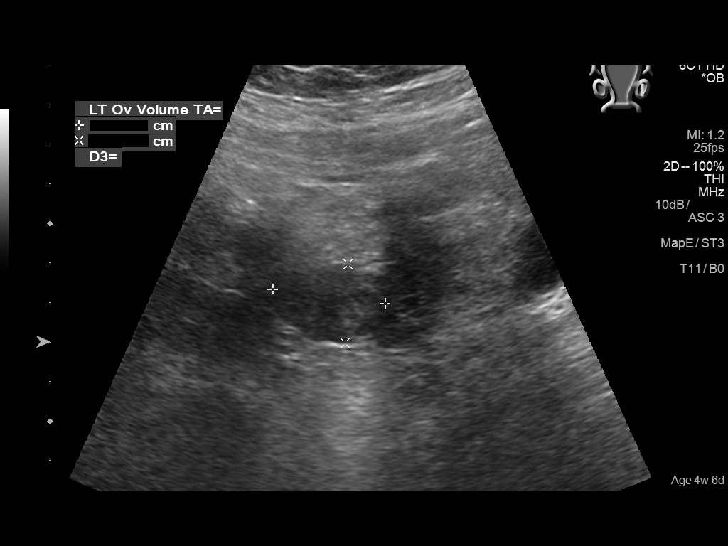
[im 41/100]
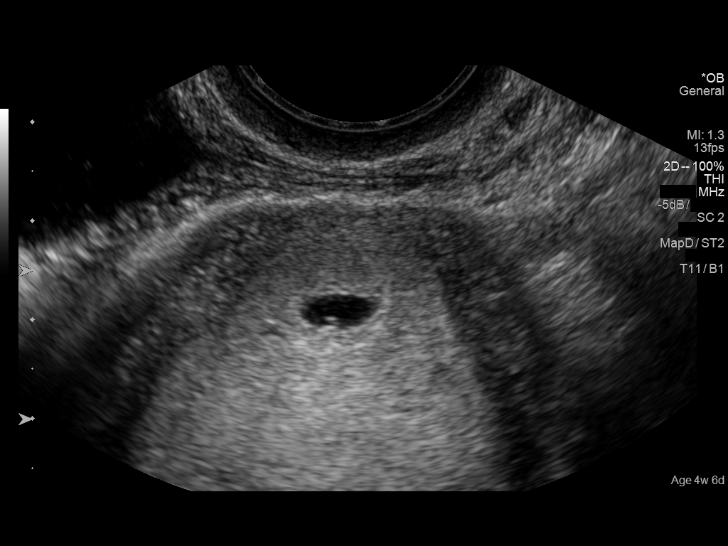
[im 52/100]
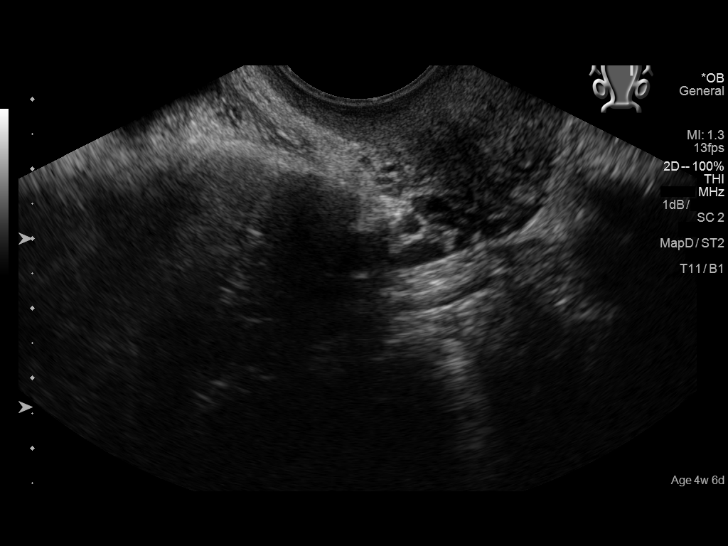
[im 59/100]
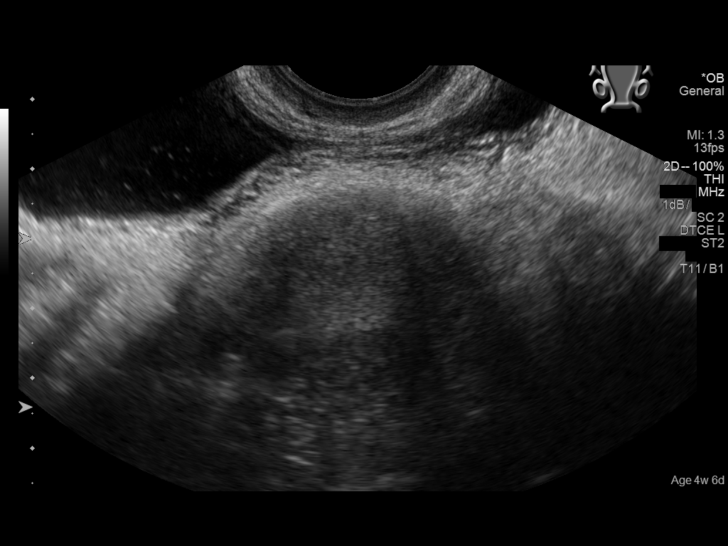
[im 67/100]
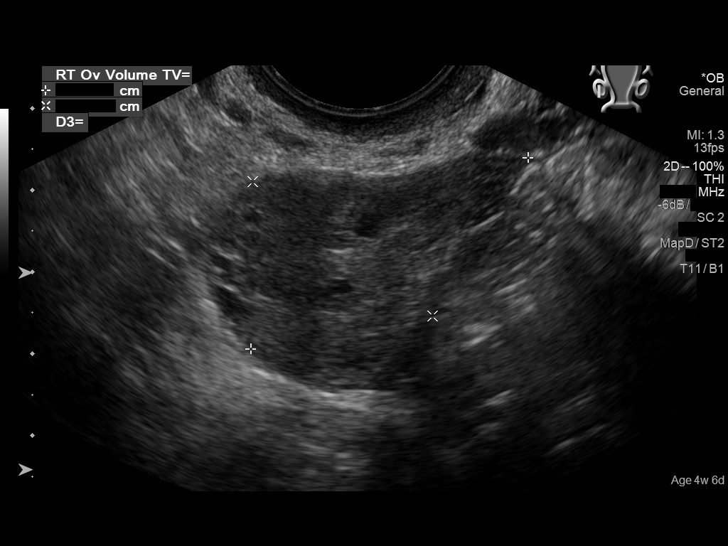
[im 74/100]
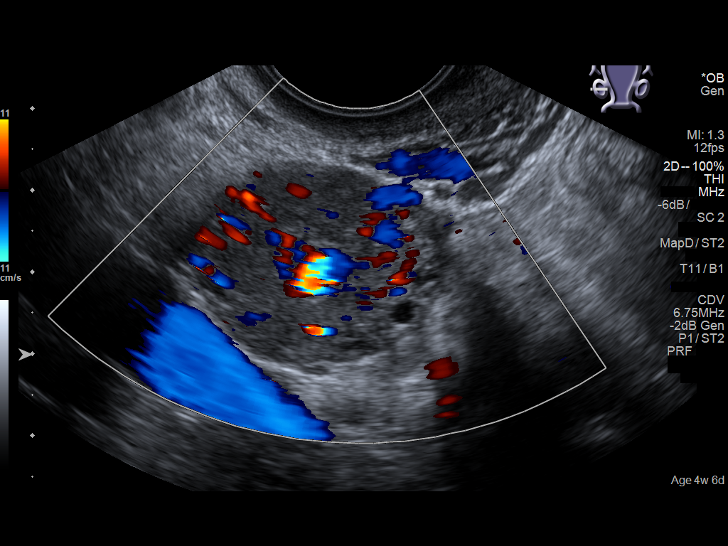
[im 81/100]
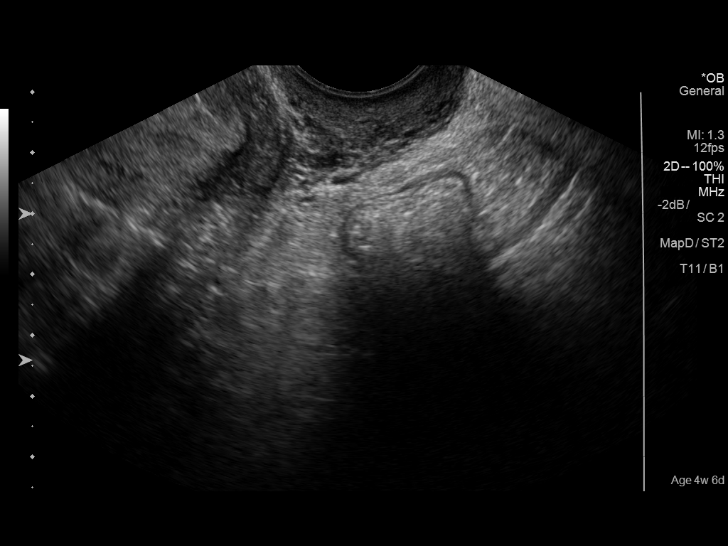
[im 89/100]
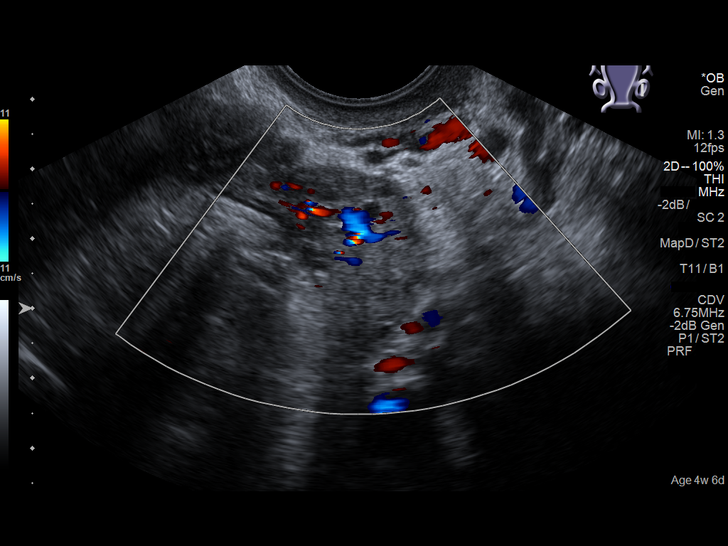
[im 96/100]
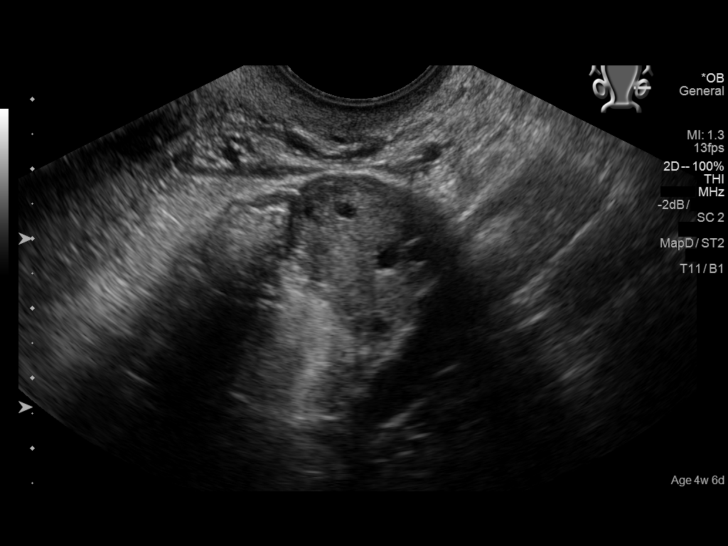

[13 of 28 positions shown; findings below may reference images not displayed]

FINDINGS: Intrauterine gestational sac: Single

Yolk sac:  Visualized.

Embryo:  Not Visualized.

Cardiac Activity: Not Visualized.

MSD: 6  mm   5 w   2  d

Subchorionic hemorrhage:  None visualized.

Maternal uterus/adnexae: The right ovary is normal and contains a
probable 2.3 x 1.7 x 2.5 cm corpus luteal cyst. There is normal
blood flow. The left ovary is normal measuring 3.2 x 1.9 x 2.4 cm.
There is normal blood flow. No pelvic free fluid.

Pulsed Doppler evaluation of both ovaries demonstrates normal
appearing low-resistance arterial and venous waveforms.
IMPRESSION: 1. Early intrauterine gestational sac with a yolk sac; no fetal
pole, or cardiac activity yet visualized. No subchorionic
hemorrhage. Recommend follow-up quantitative B-HCG levels and
follow-up US in 10 days to assess viability. This recommendation
follows SRU consensus guidelines: Diagnostic Criteria for Nonviable
Pregnancy Early in the First Trimester. N Engl J Med 5458;
[DATE].
2. Normal appearance of both ovaries without torsion. Normal blood
flow.

## 2018-03-27 IMAGING — US US MFM OB DETAIL+14 WK
1 series · 12 of 28 positions shown · non-contrast
Comparison: none

PATIENT INFO:

PERFORMED BY:
SERVICE(S) PROVIDED:
INDICATIONS:
29 weeks gestation of pregnancy
IUGr
Increased dopplers
FETAL EVALUATION:
Num Of Fetuses:     1
Fetal Heart         153
Rate(bpm):
Fetal Lie:          Maternal Left
Presentation:       Cephalic
Placenta:           Right Lateral Grade 1, No Previa
P. Cord Insertion:  Normal
AFI Sum(cm)     %Tile       Largest Pocket(cm)
13.76           44
RUQ(cm)       RLQ(cm)       LUQ(cm)        LLQ(cm)
3.09
BIOMETRY:
BPD:        77  mm     G. Age:  30w 6d         78  %    CI:        75.98   %    70 - 86
FL/HC:       19.6  %    19.2 -
HC:       280   mm     G. Age:  30w 5d         48  %    HC/AC:       1.15       0.99 -
AC:      244.5  mm     G. Age:  28w 5d         21  %    FL/BPD:      71.2  %    71 - 87
FL:       54.8  mm     G. Age:  28w 6d         20  %    FL/AC:       22.4  %    20 - 24
HUM:      51.7  mm     G. Age:  30w 1d         61  %
Est. FW:    3440   gm   2 lb 15 oz      27  %
GESTATIONAL AGE:
LMP:           29w 4d        Date:  01/24/16                 EDD:   10/30/16
U/S Today:     29w 6d                                        EDD:   10/28/16
Best:          29w 4d     Det. By:  LMP  (01/24/16)          EDD:   10/30/16
ANATOMY:
Cranium:               Within Normal Limits   Aortic Arch:            Not visualized
Cavum:                 CSP visualized         Ductal Arch:            Not visualized
Ventricles:            Normal appearance      Diaphragm:              Poorly seen
Choroid Plexus:        Within Normal Limits   Stomach:                Seen
Cerebellum:            Within Normal Limits   Abdomen:                Within Normal
Limits
Posterior Fossa:       Within Normal Limits   Abdominal Wall:         Normal appearance
Nuchal Fold:           Within Normal Limits   Cord Vessels:           3 vessels
Face:                  Orbits visualized      Kidneys:                Normal appearance
Lips:                  Normal appearance      Bladder:                Seen
Thoracic:              Within Normal Limits   Spine:                  Normal appearance
Heart:                 4-Chamber view         Upper Extremities:      Visualized
appears normal
RVOT:                  Normal appearance      Lower Extremities:      Visualized
LVOT:                  Normal appearance
DOPPLER - FETAL VESSELS:
Umbilical Artery
S/D     %tile     RI    %tile                     PSV
(cm/s)
3.77       88   0.[REDACTED]
CERVIX UTERUS ADNEXA:
Cervix
Length:           3.96  cm.

[Series 1: us mfm ob detail+14 wk · 0.31mm/px · 12 of 110 slices shown]
[im 5/110]
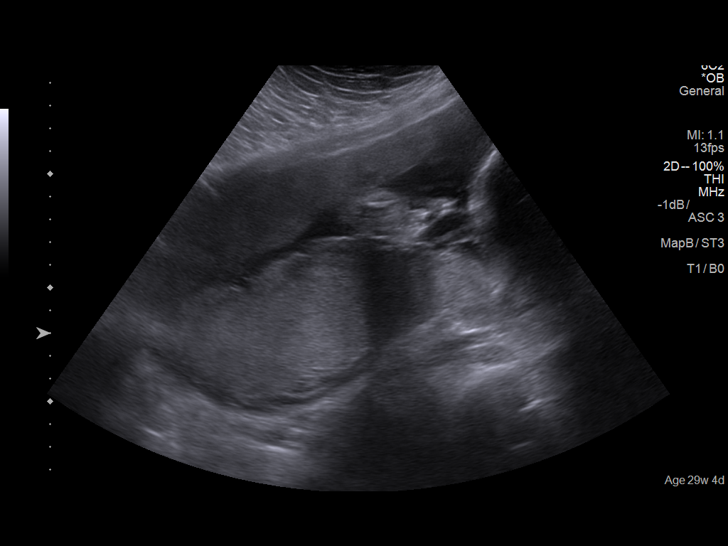
[im 13/110]
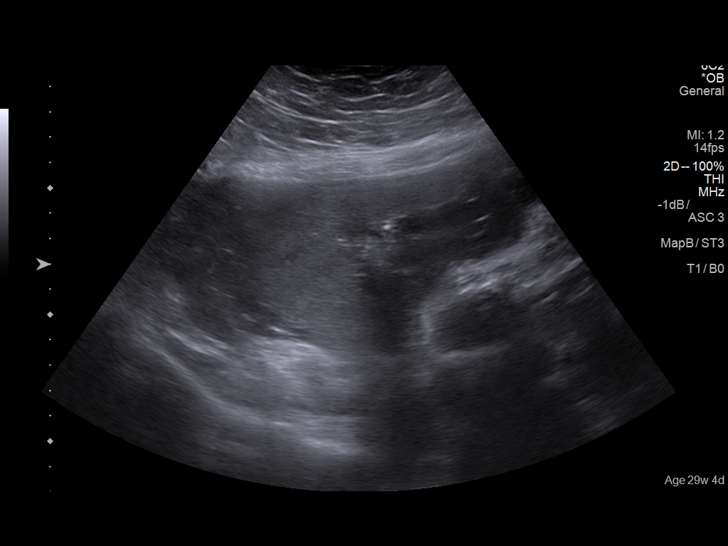
[im 21/110]
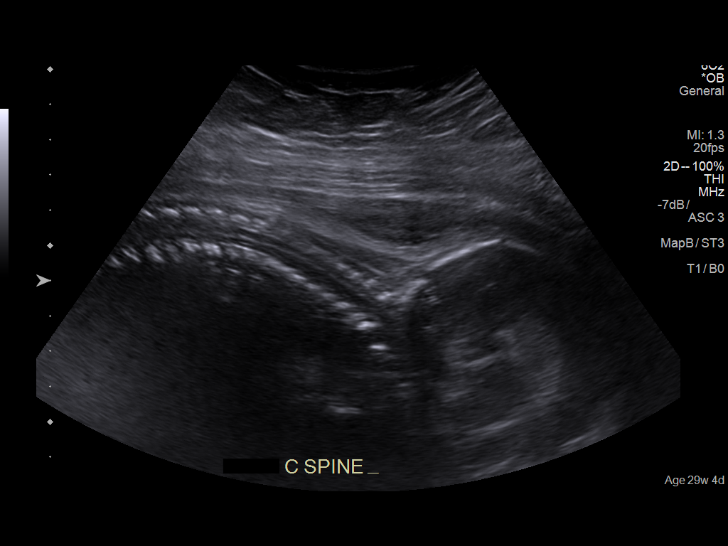
[im 33/110]
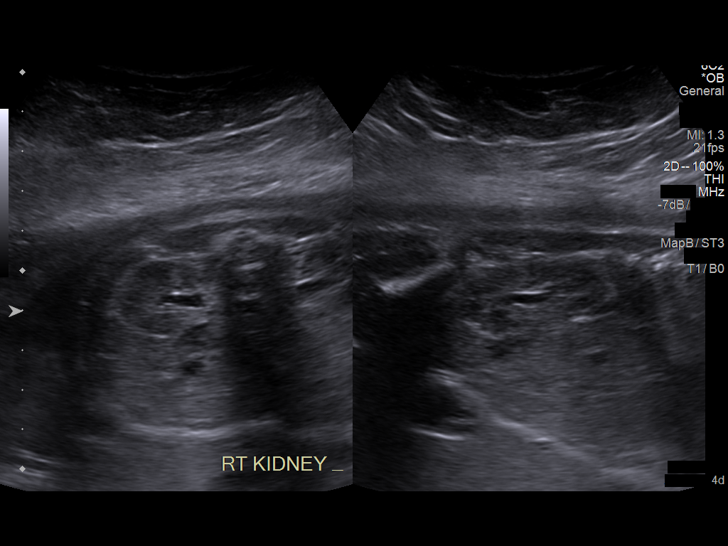
[im 41/110]
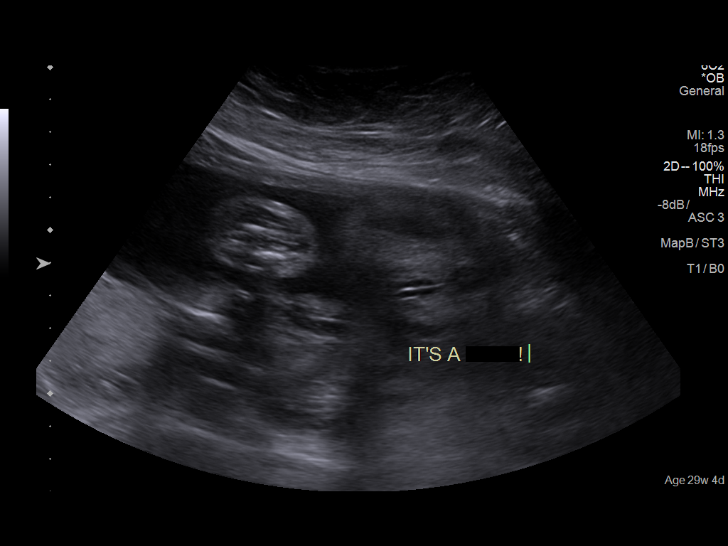
[im 49/110]
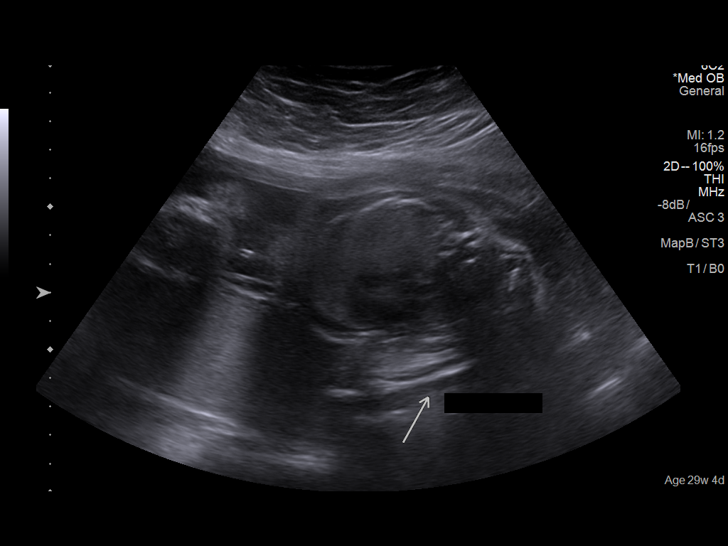
[im 61/110]
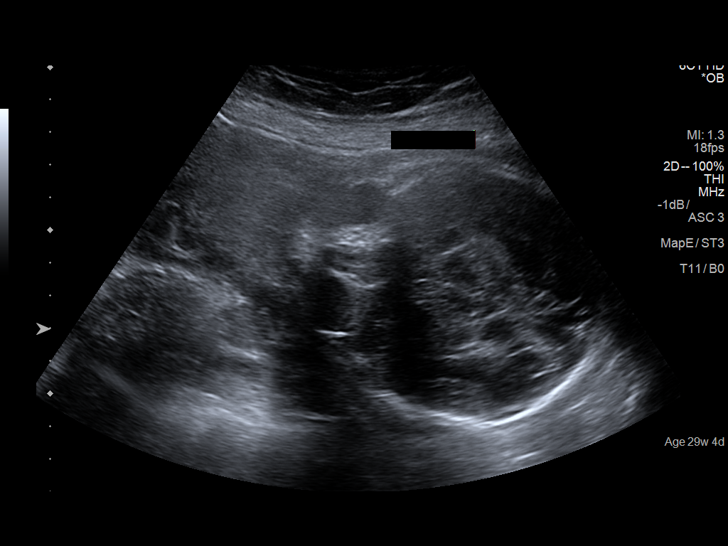
[im 69/110]
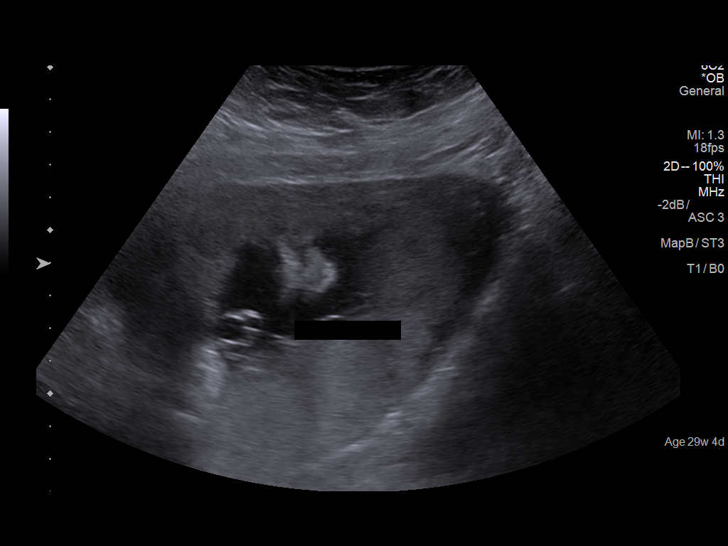
[im 77/110]
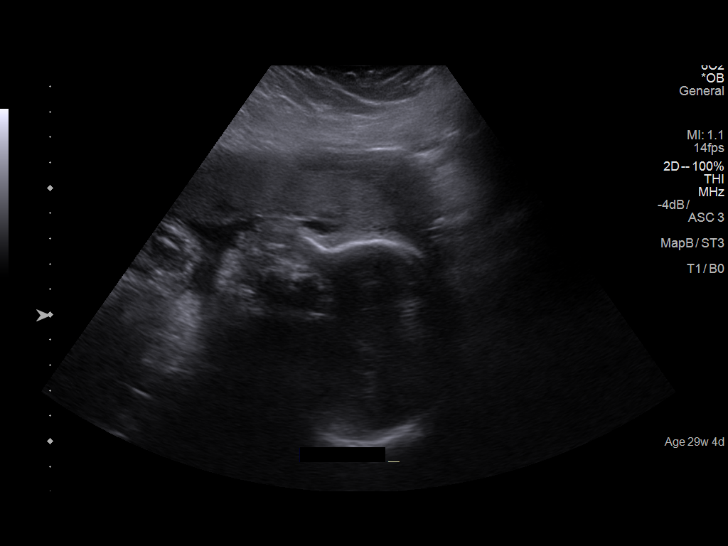
[im 89/110]
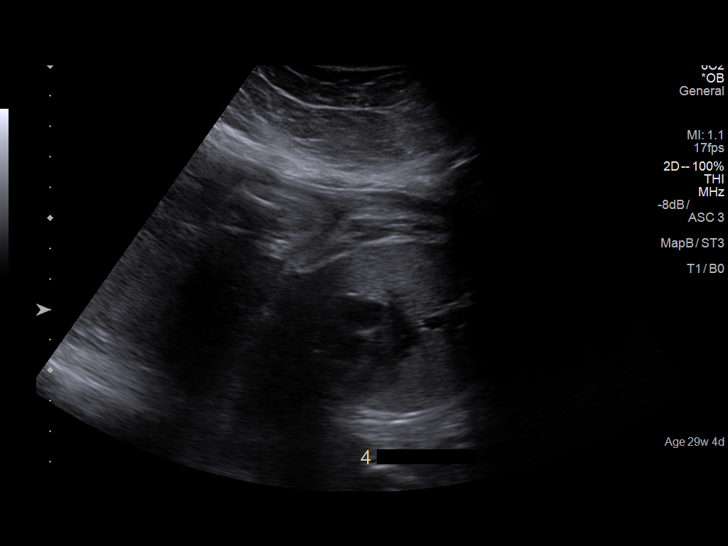
[im 97/110]
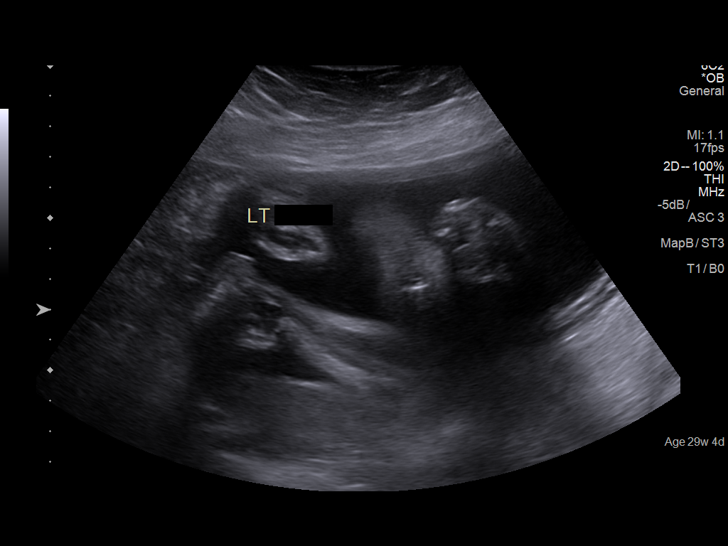
[im 105/110]
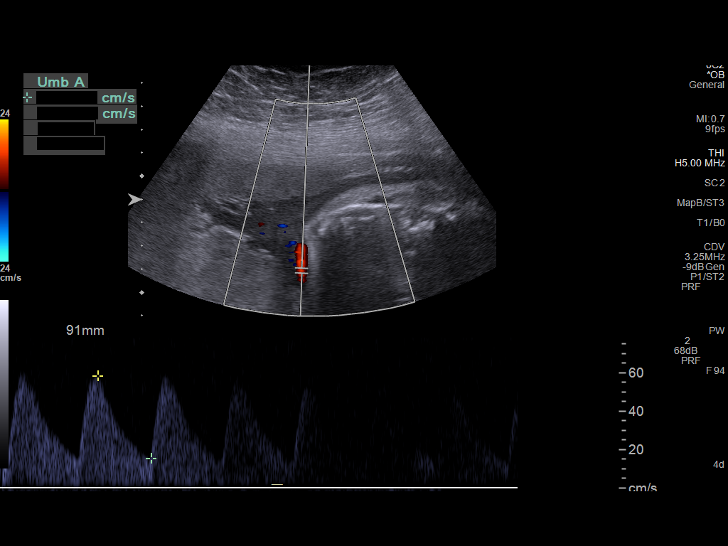

[12 of 28 positions shown; findings below may reference images not displayed]

IMPRESSION: Dear Dr. BILEJ,

Thank you for referring your patient to [facility] for a
fetal growth evaluation.

There is a singleton gestation with normal amniotic fluid
volume.

The estimated fetal weight is at the 27  percentile.  The
abdominal circumference measured at the 20th percentile

The fetal biometry correlates with established dating.

Detailed evaluation of the fetal anatomy was performed.  The
exam was limited by advanced gestational age and
acoustics. The fetal anatomical survey appears within normal
limits within the resolution of ultrasound as described above.
In addition, open hands and short axis of the heart were not
well seen.

It must be noted that a normal ultrasound cannot rule out
aneuploidy.

We recommend a repeat exam in 3-4 weeks for growth.

Thank you for allowing us to participate in your patient's care.
assistance.

## 2018-03-31 ENCOUNTER — Other Ambulatory Visit: Payer: Self-pay | Admitting: Obstetrics and Gynecology

## 2018-04-07 ENCOUNTER — Ambulatory Visit (INDEPENDENT_AMBULATORY_CARE_PROVIDER_SITE_OTHER): Payer: 59 | Admitting: Obstetrics and Gynecology

## 2018-04-07 ENCOUNTER — Encounter: Payer: Self-pay | Admitting: Obstetrics and Gynecology

## 2018-04-07 ENCOUNTER — Other Ambulatory Visit (HOSPITAL_COMMUNITY)
Admission: RE | Admit: 2018-04-07 | Discharge: 2018-04-07 | Disposition: A | Discharge status: Home / Self Care | Payer: 59 | Source: Ambulatory Visit | Attending: Obstetrics and Gynecology | Admitting: Obstetrics and Gynecology

## 2018-04-07 VITALS — BP 133/88 | HR 93 | Ht 65.0 in | Wt 258.0 lb

## 2018-04-07 DIAGNOSIS — Z01419 Encounter for gynecological examination (general) (routine) without abnormal findings: Secondary | ICD-10-CM | POA: Insufficient documentation

## 2018-04-07 DIAGNOSIS — Z113 Encounter for screening for infections with a predominantly sexual mode of transmission: Secondary | ICD-10-CM | POA: Diagnosis present

## 2018-04-07 DIAGNOSIS — Z1239 Encounter for other screening for malignant neoplasm of breast: Secondary | ICD-10-CM

## 2018-04-07 MED ORDER — MEDROXYPROGESTERONE ACETATE 150 MG/ML IM SUSP: 150.0000 mg | INTRAMUSCULAR | 4 refills | Status: DC

## 2018-04-07 NOTE — Progress Notes (Signed)
Gynecology Annual Exam   PCP: Dione Housekeeperlmedo, Mario Ernesto, MD  Chief Complaint:  Chief Complaint  Patient presents with  . Gynecologic Exam    Pt requesting std testing    History of Present Illness: Patient is a 28 y.o. G1P1001 presents for annual exam. The patient has no complaints today.   LMP: No LMP recorded. Patient has had an injection.  The patient is sexually active. She currently uses Depo-Provera injections for contraception. She denies dyspareunia.  The patient does perform self breast exams.  There is no notable family history of breast or ovarian cancer in her family.  The patient wears seatbelts: yes.   The patient has regular exercise: not asked.    The patient denies current symptoms of depression.    Review of Systems: Review of Systems  Constitutional: Negative for chills and fever.  HENT: Negative for congestion.   Respiratory: Negative for cough and shortness of breath.   Cardiovascular: Negative for chest pain and palpitations.  Gastrointestinal: Negative for abdominal pain, constipation, diarrhea, heartburn, nausea and vomiting.  Genitourinary: Negative for dysuria, frequency and urgency.  Skin: Negative for itching and rash.  Neurological: Negative for dizziness and headaches.  Endo/Heme/Allergies: Negative for polydipsia.  Psychiatric/Behavioral: Negative for depression.    Past Medical History:  Past Medical History:  Diagnosis Date  . Asthma   . Endometriosis   . Endometriosis   . Hidradenitis   . Hydronephrosis   . IBS (irritable bowel syndrome)   . Migraine   . Migraine   . Pseudotumor cerebri   . Sciatica   . Scoliosis     Past Surgical History:  Past Surgical History:  Procedure Laterality Date  . COLONOSCOPY  2001;2008  . 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 LAPAROSCOPY  04/2007  . SPINAL FUSION    . SPINAL FUSION  2008  . SPINAL FUSION    . TONSILLECTOMY       Gynecologic History:  No LMP recorded. Patient has had an injection. Contraception: Depo-Provera injections Last Pap: Results were:03/27/2017 no abnormalities   Obstetric History: G1P1001  Family History:  Family History  Problem Relation Age of Onset  . Bipolar disorder Mother   . Depression Mother   . Breast cancer Mother [id number]  . Cancer Father        Kidney  . Depression Maternal Grandmother   . Breast cancer Maternal Grandmother [id number]       contact/    Social History:  Social History   Socioeconomic History  . Marital status: Single    Spouse name: Not on file  . Number of children: Not on file  . Years of education: Not on file  . Highest education level: Not on file  Occupational History  . Not on file  Social Needs  . Financial resource strain: Not on file  . Food insecurity:    Worry: Not on file    Inability: Not on file  . Transportation needs:    Medical: Not on file    Non-medical: Not on file  Tobacco Use  . Smoking status: Current Some Day Smoker    Packs/day: 0.50    Types: Cigarettes  . Smokeless tobacco: Never Used  Substance and Sexual Activity  . Alcohol use: No  . Drug use: No  . Sexual activity: Yes    Birth control/protection: Injection  Lifestyle  . Physical activity:    Days  per week: Not on file    Minutes per session: Not on file  . Stress: Not on file  Relationships  . Social connections:    Talks on phone: Not on file    Gets together: Not on file    Attends religious service: Not on file    Active member of club or organization: Not on file    Attends meetings of clubs or organizations: Not on file    Relationship status: Not on file  . Intimate partner violence:    Fear of current or ex partner: Not on file    Emotionally abused: Not on file    Physically abused: Not on file    Forced sexual activity: Not on file  Other Topics Concern  . Not on file  Social History Narrative  . Not on file    Allergies:  Allergies   Allergen Reactions  . Amoxicillin Rash  . Cefaclor Rash  . Cefadroxil Rash  . Cefuroxime Rash  . Cephalexin Rash  . Clarithromycin Rash  . Clindamycin Anaphylaxis  . Doxycycline Swelling    Optic pressure_fluid swelling  . Erythromycin Rash  . Penicillins Rash  . Sulfamethoxazole-Trimethoprim Rash  . Vitamin A     Other reaction(s): Other (See Comments) Causes pressure in brain to go up and she has to have lumbar puncture to draw fluid off.  . Dextromethorphan Hbr Rash and Swelling  . Morphine Itching  . Other     Other reaction(s): Other (See Comments) "Pretty much every antibiotic there is" "Pretty much every antibiotic there is"   . Azithromycin Rash  . Red Dye Rash    Medications: Prior to Admission medications   Medication Sig Start Date End Date Taking? Authorizing Provider  medroxyPROGESTERone (DEPO-PROVERA) 150 MG/ML injection Inject 1 mL (150 mg total) into the muscle every 3 (three) months. 02/04/17  Yes Conard NovakJackson, Stephen D, MD  albuterol (PROVENTIL HFA;VENTOLIN HFA) 108 (90 Base) MCG/ACT inhaler Inhale 2 puffs into the lungs every 4 (four) hours as needed for wheezing or shortness of breath. Patient not taking: Reported on 04/07/2018 04/05/17   Cuthriell, Delorise RoyalsJonathan D, PA-C  bacitracin ointment Apply to affected area twice daily x 7 days Patient not taking: Reported on 04/07/2018 07/27/17 07/27/18  Rebecka ApleyWebster, Allison P, MD  cetirizine (ZYRTEC) 10 MG tablet Take 1 tablet (10 mg total) by mouth daily. Patient not taking: Reported on 04/07/2018 12/14/16   Candis SchatzHarris, Michael D, PA-C  HUMIRA PEN 40 MG/0.4ML PNKT  02/27/17   [provider]  ondansetron (ZOFRAN-ODT) 4 MG disintegrating tablet Take 1 tablet (4 mg total) by mouth every 8 (eight) hours as needed for nausea or vomiting. Patient not taking: Reported on 04/07/2018 04/05/17   Cuthriell, Delorise RoyalsJonathan D, PA-C  predniSONE (DELTASONE) 50 MG tablet Take 1 tablet (50 mg total) by mouth daily with breakfast. Patient not taking:  Reported on 04/07/2018 04/05/17   Cuthriell, Delorise RoyalsJonathan D, PA-C    Physical Exam Vitals: Blood pressure 133/88, pulse 93, height 5\' 5"  (1.651 m), weight 258 lb (117 kg).  General: NAD HEENT: normocephalic, anicteric Thyroid: no enlargement, no palpable nodules Pulmonary: No increased work of breathing, CTAB Cardiovascular: RRR, distal pulses 2+ Breast: Breast symmetrical, no tenderness, no palpable nodules or masses, no skin or nipple retraction present, no nipple discharge.  No axillary or supraclavicular lymphadenopathy. Abdomen: NABS, soft, non-tender, non-distended.  Umbilicus without lesions.  No hepatomegaly, splenomegaly or masses palpable. No evidence of hernia  Genitourinary:  External: Normal external female genitalia.  Normal urethral meatus, normal Bartholin's and Skene's glands.    Vagina: Normal vaginal mucosa, no evidence of prolapse.    Cervix: Grossly normal in appearance, no bleeding  Uterus: Non-enlarged, mobile, normal contour.  No CMT  Adnexa: ovaries non-enlarged, no adnexal masses  Rectal: deferred  Lymphatic: no evidence of inguinal lymphadenopathy Extremities: no edema, erythema, or tenderness Neurologic: Grossly intact Psychiatric: mood appropriate, affect full  Female chaperone present for pelvic and breast  portions of the physical exam    Assessment: 28 y.o. G1P1001 routine annual exam  Plan: Problem List Items Addressed This Visit    None    Visit Diagnoses    Routine screening for STI (sexually transmitted infection)    -  Primary   Relevant Orders   Cervicovaginal ancillary only   HEP, RPR, HIV Panel (Completed)   Encounter for gynecological examination without abnormal finding       Relevant Orders   Cervicovaginal ancillary only   HEP, RPR, HIV Panel (Completed)   Breast screening          1) STI screening  wasoffered and accepted  2)  ASCCP guidelines and rational discussed.  Patient opts for every 3 years screening interval  3)  Contraception - the patient is currently using  Depo-Provera injections.  She is happy with her current form of contraception and plans to continue  4) Routine healthcare maintenance including cholesterol, diabetes screening discussed managed by PCP  5) Return in about 1 year (around 04/08/2019) for annual, every 3 months nurses visit depo injection.   Vena Austria, MD, [facility], [facility] 04/07/2018, 3:24 PM

## 2018-04-08 LAB — HEP, RPR, HIV PANEL
HIV Screen 4th Generation wRfx: NONREACTIVE
Hepatitis B Surface Ag: NEGATIVE
RPR Ser Ql: NONREACTIVE

## 2018-04-10 LAB — CERVICOVAGINAL ANCILLARY ONLY
CHLAMYDIA, DNA PROBE: NEGATIVE
NEISSERIA GONORRHEA: NEGATIVE
Trichomonas: NEGATIVE

## 2018-04-20 IMAGING — US US MFM OB FOLLOW-UP
1 series · 13 of 28 positions shown · non-contrast
Comparison: none

PATIENT INFO:

PERFORMED BY:
SERVICE(S) PROVIDED:
INDICATIONS:
33 weeks gestation of pregnancy
Morbid obesity (BMI 40)
Growth
FETAL EVALUATION:
Num Of Fetuses:     1
Fetal Heart         153
Rate(bpm):
Cardiac Activity:   Present
Presentation:       Cephalic
Placenta:           Posterior, No previa
Amniotic Fluid
AFI FV:      Normal
AFI Sum(cm)     %Tile
16.4            59
BIOMETRY:
BPD:      84.5  mm     G. Age:  34w 0d         71  %    CI:        75.58   %    70 - 86
FL/HC:       20.1  %    19.9 -
HC:      308.2  mm     G. Age:  34w 3d         45  %    HC/AC:       1.12       0.96 -
AC:      275.1  mm     G. Age:  31w 4d         12  %    FL/BPD:      73.1  %    71 - 87
FL:       61.8  mm     G. Age:  32w 0d         15  %    FL/AC:       22.5  %    20 - 24
HUM:      57.3  mm     G. Age:  33w 2d         61  %
Est. FW:    0664   gm     4 lb 4 oz     21  %
GESTATIONAL AGE:
LMP:           33w 1d        Date:  01/23/16                 EDD:   10/29/16
U/S Today:     33w 0d                                        EDD:   10/30/16
Best:          33w 1d     Det. By:  LMP  (01/23/16)          EDD:   10/29/16
ANATOMY:
Cavum:                 Visualized             Ductal Arch:            Not visualized
previously
Ventricles:            Normal appearance      Diaphragm:              Normal appearance
Posterior Fossa:       Visualized             Stomach:                Within Normal Limits
Face:                  Visualized             Abdomen:                Normal appearance
Lips:                  Visualized             Abdominal Wall:         Visualized
previously                                     previously
Heart:                 4-Chamber view         Cord Vessels:           3 Vessels
appears normal                                 visualized previously
RVOT:                  Normal appearance      Kidneys:                Normal appearance
LVOT:                  Normal appearance      Bladder:                Within Normal Limits
Aortic Arch:           Normal appearance      Spine:                  Normal appearance
Other:  Ao/PA normal appearance

[Series 1: us mfm ob follow-up · 0.28mm/px · 62 acquisitions, 13 frames shown]
[im 3/62]
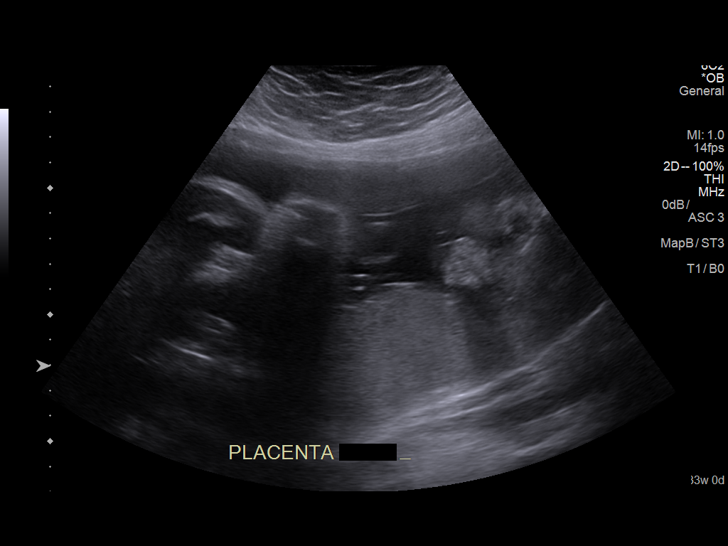
[im 7/62]
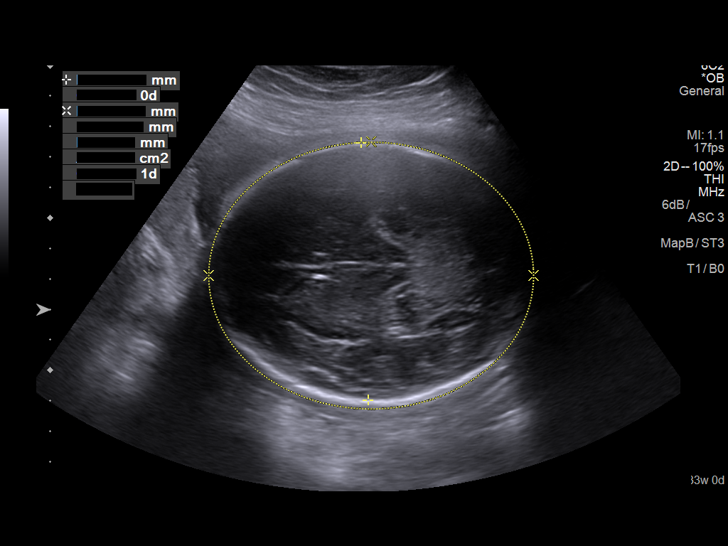
[im 12/62]
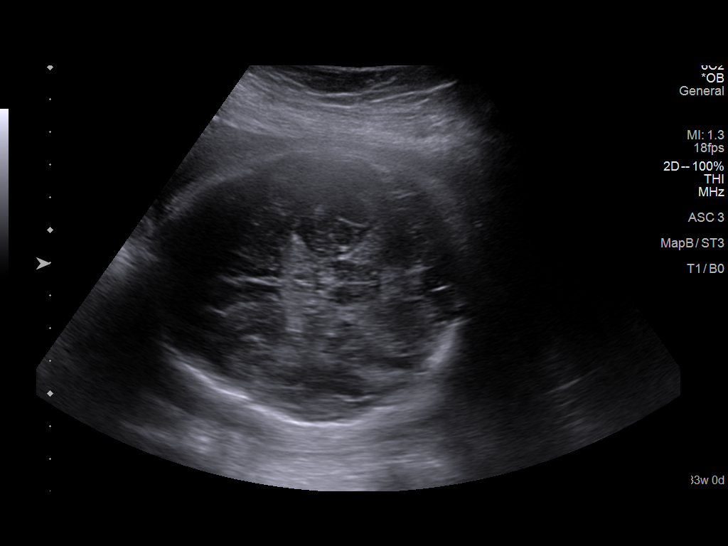
[im 16/62]
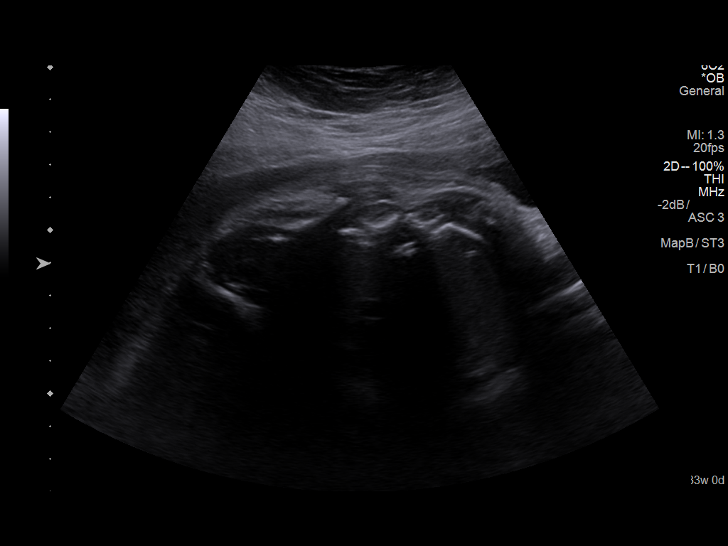
[im 21/62]
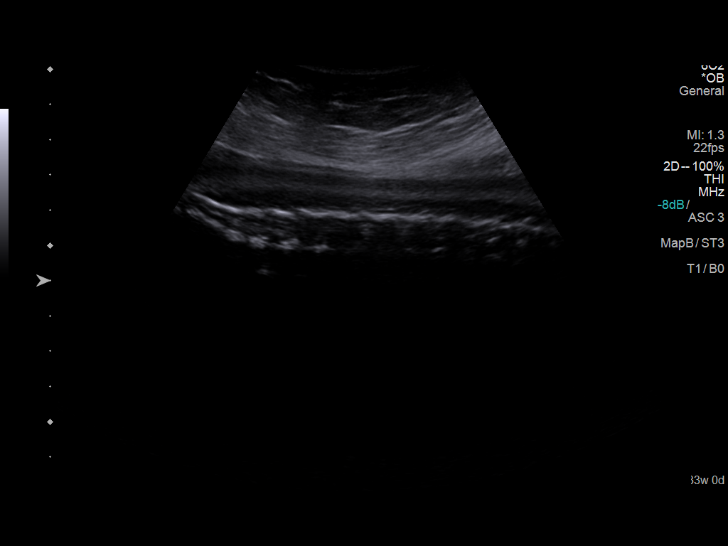
[im 25/62]
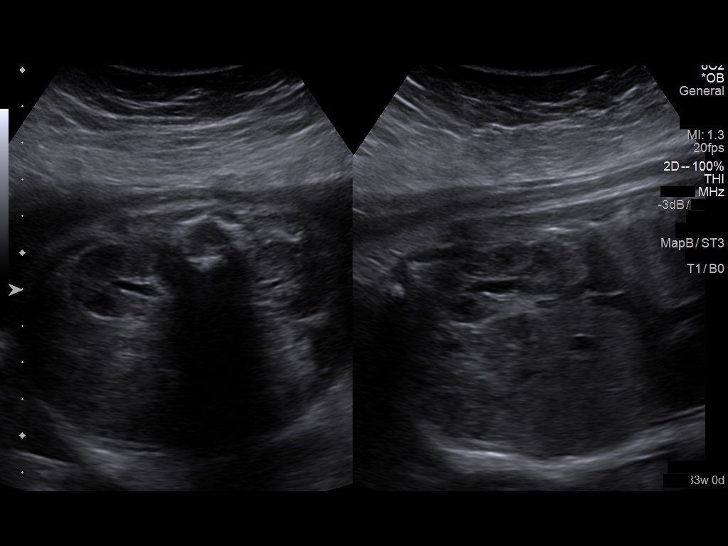
[im 32/62]
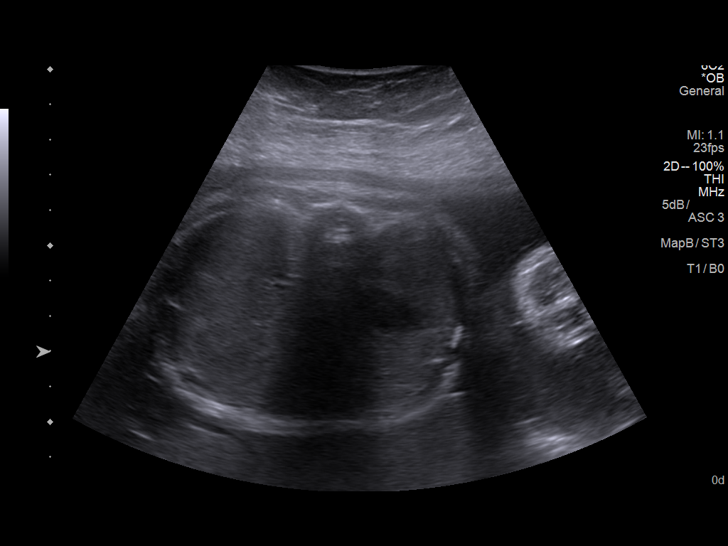
[im 37/62]
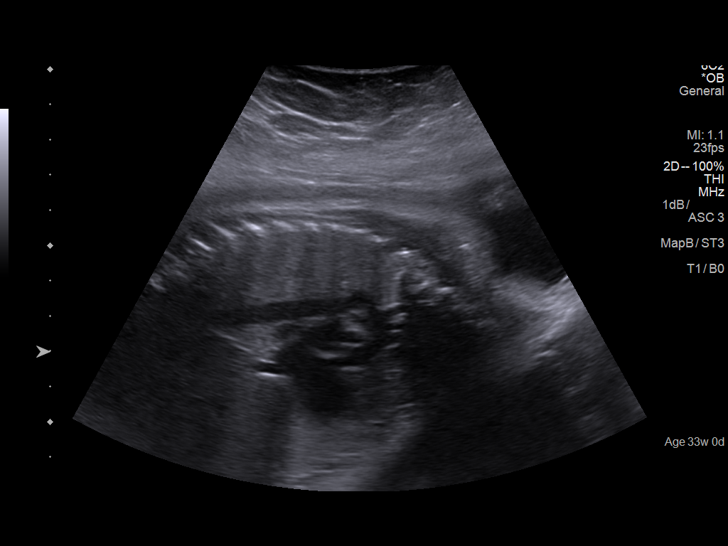
[im 41/62]
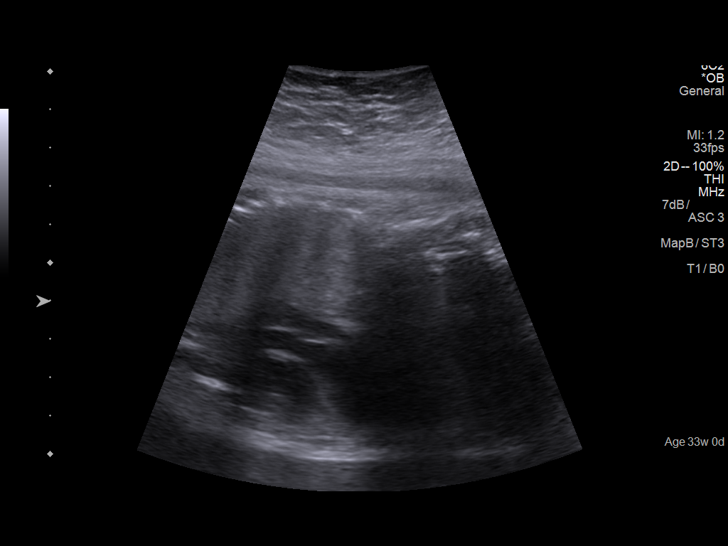
[im 46/62]
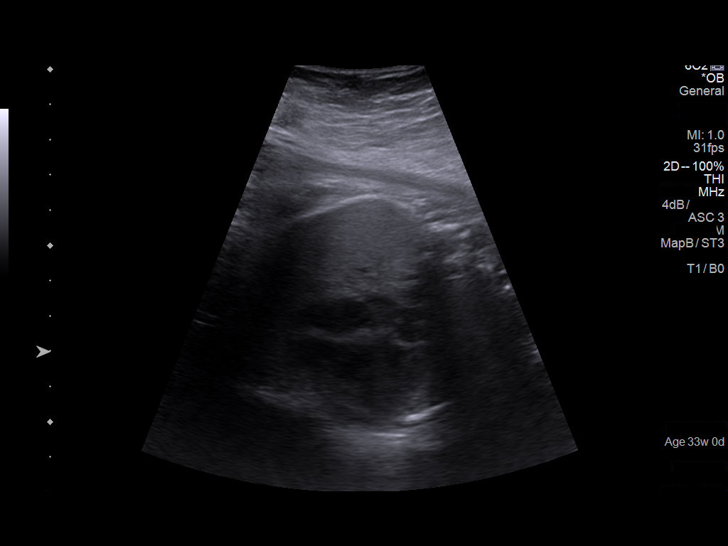
[im 50/62]
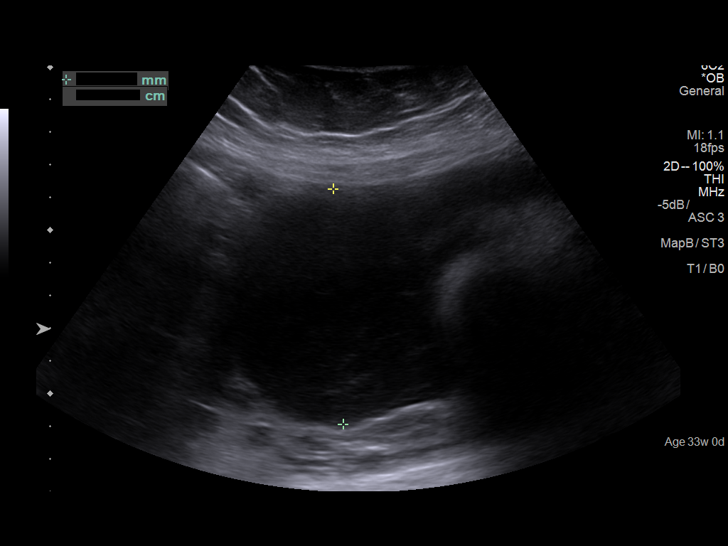
[im 55/62]
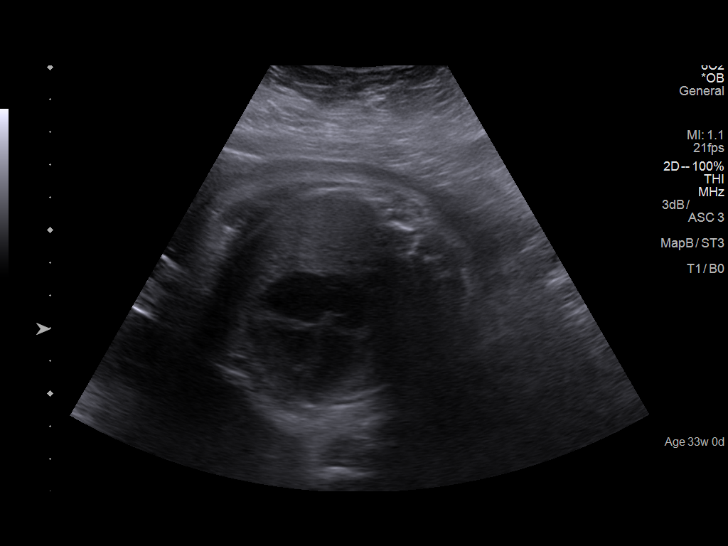
[im 59/62]
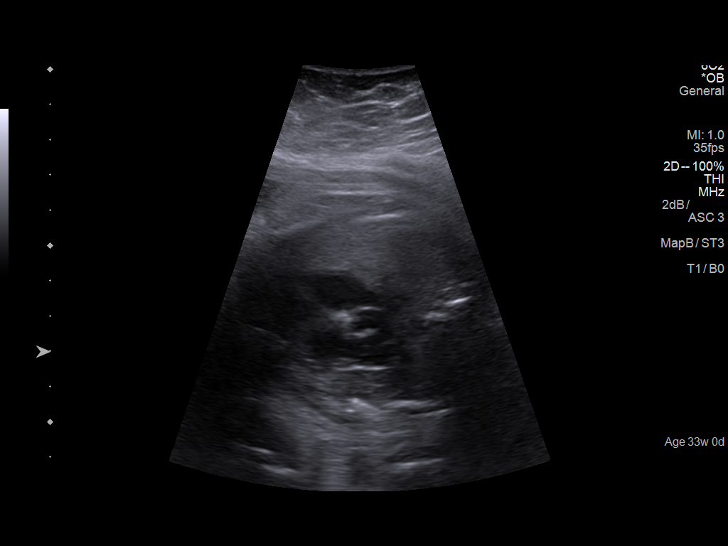

[13 of 28 positions shown; findings below may reference images not displayed]

IMPRESSION: Intrauterine pregnancy with a best estimated gestational age
of 33 weeks 1 day.  Dating is based on LMP consistent with
earliest available ultrasound performed at [facility]
on 03/07/2016; measurements were reported as 6 weeks 1 day.

Follow up ultrasound performed to assess growth.

Fetal anatomy visualized appears normal (including aortic
arch, diaphragm and heart views) or has been documented
previously.

Appropriate interval growth.

Recommend follow up ultrasound for growth in 4 weeks
(scheduled).

## 2019-04-09 ENCOUNTER — Other Ambulatory Visit: Payer: Self-pay | Admitting: Obstetrics and Gynecology

## 2019-04-11 ENCOUNTER — Telehealth: Payer: Self-pay | Admitting: Obstetrics and Gynecology

## 2019-04-11 ENCOUNTER — Other Ambulatory Visit: Payer: Self-pay

## 2019-04-11 MED ORDER — MEDROXYPROGESTERONE ACETATE 150 MG/ML IM SUSP: 150.0000 mg | INTRAMUSCULAR | 0 refills | Status: DC

## 2019-04-11 NOTE — Telephone Encounter (Signed)
Patient needs refill on depo, annual scheduled 2/18 with AMS/MB.  [facility] [place].

## 2019-04-21 ENCOUNTER — Ambulatory Visit: Payer: 59 | Admitting: Obstetrics and Gynecology

## 2019-06-26 ENCOUNTER — Other Ambulatory Visit: Payer: Self-pay | Admitting: Obstetrics and Gynecology

## 2019-07-09 ENCOUNTER — Other Ambulatory Visit: Payer: Self-pay | Admitting: Obstetrics and Gynecology

## 2019-09-29 ENCOUNTER — Other Ambulatory Visit: Payer: Self-pay | Admitting: Obstetrics and Gynecology

## 2019-09-30 ENCOUNTER — Telehealth: Payer: Self-pay | Admitting: Obstetrics and Gynecology

## 2019-09-30 ENCOUNTER — Other Ambulatory Visit: Payer: Self-pay | Admitting: Obstetrics and Gynecology

## 2019-09-30 MED ORDER — MEDROXYPROGESTERONE ACETATE 150 MG/ML IM SUSP: 150.0000 mg | INTRAMUSCULAR | 0 refills | Status: DC

## 2019-09-30 NOTE — Telephone Encounter (Signed)
So we have already refilled once and told her to make an appointment this is the last time Rx has been sent

## 2019-09-30 NOTE — Telephone Encounter (Signed)
Pt needs refill on depo please

## 2019-09-30 NOTE — Telephone Encounter (Signed)
Pt aware via vm 

## 2019-09-30 NOTE — Telephone Encounter (Signed)
Last annual was 04/2018. Scheduled in August, advise

## 2019-10-20 ENCOUNTER — Ambulatory Visit (INDEPENDENT_AMBULATORY_CARE_PROVIDER_SITE_OTHER): Payer: Medicaid Other | Admitting: Obstetrics and Gynecology

## 2019-10-20 ENCOUNTER — Other Ambulatory Visit (HOSPITAL_COMMUNITY)
Admission: RE | Admit: 2019-10-20 | Discharge: 2019-10-20 | Disposition: A | Discharge status: Home / Self Care | Payer: Medicaid Other | Source: Ambulatory Visit | Attending: Obstetrics and Gynecology | Admitting: Obstetrics and Gynecology

## 2019-10-20 ENCOUNTER — Other Ambulatory Visit: Payer: Self-pay

## 2019-10-20 ENCOUNTER — Encounter: Payer: Self-pay | Admitting: Obstetrics and Gynecology

## 2019-10-20 VITALS — BP 144/81 | Ht 65.0 in | Wt 255.0 lb

## 2019-10-20 DIAGNOSIS — Z113 Encounter for screening for infections with a predominantly sexual mode of transmission: Secondary | ICD-10-CM | POA: Insufficient documentation

## 2019-10-20 DIAGNOSIS — Z3042 Encounter for surveillance of injectable contraceptive: Secondary | ICD-10-CM

## 2019-10-20 DIAGNOSIS — Z01419 Encounter for gynecological examination (general) (routine) without abnormal findings: Secondary | ICD-10-CM

## 2019-10-20 DIAGNOSIS — Z124 Encounter for screening for malignant neoplasm of cervix: Secondary | ICD-10-CM | POA: Insufficient documentation

## 2019-10-20 DIAGNOSIS — Z1339 Encounter for screening examination for other mental health and behavioral disorders: Secondary | ICD-10-CM

## 2019-10-20 DIAGNOSIS — Z1331 Encounter for screening for depression: Secondary | ICD-10-CM | POA: Diagnosis not present

## 2019-10-20 MED ORDER — MEDROXYPROGESTERONE ACETATE 150 MG/ML IM SUSP: 150.0000 mg | INTRAMUSCULAR | 3 refills | Status: DC
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
## 2019-10-26 LAB — CYTOLOGY - PAP
Chlamydia: NEGATIVE
Comment: NEGATIVE
Comment: NEGATIVE
Comment: NEGATIVE
Comment: NORMAL
Diagnosis: UNDETERMINED — AB
High risk HPV: NEGATIVE
Neisseria Gonorrhea: NEGATIVE
Trichomonas: NEGATIVE

## 2019-11-20 ENCOUNTER — Other Ambulatory Visit: Payer: Self-pay | Admitting: Obstetrics and Gynecology

## 2019-11-20 DIAGNOSIS — Z3042 Encounter for surveillance of injectable contraceptive: Secondary | ICD-10-CM

## 2019-11-20 DIAGNOSIS — Z01419 Encounter for gynecological examination (general) (routine) without abnormal findings: Secondary | ICD-10-CM

## 2020-08-17 ENCOUNTER — Other Ambulatory Visit: Payer: Self-pay | Admitting: Family Medicine

## 2020-08-17 DIAGNOSIS — R1033 Periumbilical pain: Secondary | ICD-10-CM

## 2020-08-17 DIAGNOSIS — R198 Other specified symptoms and signs involving the digestive system and abdomen: Secondary | ICD-10-CM

## 2020-09-09 ENCOUNTER — Emergency Department
Admission: EM | Admit: 2020-09-09 | Discharge: 2020-09-09 | Discharge status: Against Medical Advice | Payer: Medicaid Other

## 2020-09-09 NOTE — ED Notes (Signed)
No answer when called for triage, pt not visualized in lobby

## 2020-09-09 NOTE — ED Notes (Signed)
No answer when called for triage x 3.  

## 2020-12-18 ENCOUNTER — Other Ambulatory Visit: Payer: Self-pay | Admitting: Obstetrics and Gynecology

## 2020-12-18 DIAGNOSIS — Z01419 Encounter for gynecological examination (general) (routine) without abnormal findings: Secondary | ICD-10-CM

## 2020-12-18 DIAGNOSIS — Z3042 Encounter for surveillance of injectable contraceptive: Secondary | ICD-10-CM

## 2021-06-18 ENCOUNTER — Other Ambulatory Visit: Payer: Self-pay

## 2021-06-18 DIAGNOSIS — Z3042 Encounter for surveillance of injectable contraceptive: Secondary | ICD-10-CM

## 2021-06-18 DIAGNOSIS — Z01419 Encounter for gynecological examination (general) (routine) without abnormal findings: Secondary | ICD-10-CM

## 2021-06-18 MED ORDER — MEDROXYPROGESTERONE ACETATE 150 MG/ML IM SUSP: 150.0000 mg | INTRAMUSCULAR | 0 refills | Status: DC

## 2021-06-18 NOTE — Telephone Encounter (Signed)
Pt calling; has scheduled annual; needs refill of depo so she doesn't get behind.  [phone] Left detailed msg refill eRx'd. ?

## 2021-07-22 ENCOUNTER — Ambulatory Visit: Payer: Medicaid Other | Admitting: Obstetrics

## 2021-08-01 ENCOUNTER — Ambulatory Visit: Payer: Medicaid Other | Admitting: Obstetrics

## 2021-09-27 ENCOUNTER — Other Ambulatory Visit: Payer: Self-pay

## 2021-09-27 ENCOUNTER — Emergency Department: Payer: Medicaid Other

## 2021-09-27 ENCOUNTER — Emergency Department
Admission: EM | Admit: 2021-09-27 | Discharge: 2021-09-27 | Disposition: A | Discharge status: Home / Self Care | Payer: Medicaid Other | Attending: Emergency Medicine | Admitting: Emergency Medicine

## 2021-09-27 DIAGNOSIS — J45909 Unspecified asthma, uncomplicated: Secondary | ICD-10-CM | POA: Diagnosis not present

## 2021-09-27 DIAGNOSIS — K8020 Calculus of gallbladder without cholecystitis without obstruction: Secondary | ICD-10-CM

## 2021-09-27 DIAGNOSIS — R109 Unspecified abdominal pain: Secondary | ICD-10-CM | POA: Diagnosis present

## 2021-09-27 DIAGNOSIS — Z87442 Personal history of urinary calculi: Secondary | ICD-10-CM | POA: Insufficient documentation

## 2021-09-27 DIAGNOSIS — K8062 Calculus of gallbladder and bile duct with acute cholecystitis without obstruction: Secondary | ICD-10-CM | POA: Diagnosis not present

## 2021-09-27 LAB — COMPREHENSIVE METABOLIC PANEL
ALT: 14 U/L (ref 0–44)
AST: 13 U/L — ABNORMAL LOW (ref 15–41)
Albumin: 3.5 g/dL (ref 3.5–5.0)
Alkaline Phosphatase: 78 U/L (ref 38–126)
Anion gap: 5 (ref 5–15)
BUN: 12 mg/dL (ref 6–20)
CO2: 22 mmol/L (ref 22–32)
Calcium: 8.2 mg/dL — ABNORMAL LOW (ref 8.9–10.3)
Chloride: 114 mmol/L — ABNORMAL HIGH (ref 98–111)
Creatinine, Ser: 0.9 mg/dL (ref 0.44–1.00)
GFR, Estimated: >60 mL/min (ref >60)
Glucose, Bld: 95 mg/dL (ref 70–99)
Potassium: 3.3 mmol/L — ABNORMAL LOW (ref 3.5–5.1)
Sodium: 141 mmol/L (ref 135–145)
Total Bilirubin: 0.4 mg/dL (ref 0.3–1.2)
Total Protein: 6.1 g/dL — ABNORMAL LOW (ref 6.5–8.1)

## 2021-09-27 LAB — URINALYSIS, ROUTINE W REFLEX MICROSCOPIC
Bilirubin Urine: NEGATIVE
Glucose, UA: NEGATIVE mg/dL
Hgb urine dipstick: NEGATIVE
Ketones, ur: NEGATIVE mg/dL
Nitrite: NEGATIVE
Protein, ur: NEGATIVE mg/dL
Specific Gravity, Urine: 1.015 (ref 1.005–1.030)
pH: 6 (ref 5.0–8.0)

## 2021-09-27 LAB — CBC WITH DIFFERENTIAL/PLATELET
Abs Immature Granulocytes: 0.04 10*3/uL (ref 0.00–0.07)
Basophils Absolute: 0.1 10*3/uL (ref 0.0–0.1)
Basophils Relative: 1 %
Eosinophils Absolute: 0.4 10*3/uL (ref 0.0–0.5)
Eosinophils Relative: 4 %
HCT: 44.8 % (ref 36.0–46.0)
Hemoglobin: 15.1 g/dL — ABNORMAL HIGH (ref 12.0–15.0)
Immature Granulocytes: 0 %
Lymphocytes Relative: 32 %
Lymphs Abs: 3.5 10*3/uL (ref 0.7–4.0)
MCH: 29.3 pg (ref 26.0–34.0)
MCHC: 33.7 g/dL (ref 30.0–36.0)
MCV: 87 fL (ref 80.0–100.0)
Monocytes Absolute: 0.8 10*3/uL (ref 0.1–1.0)
Monocytes Relative: 8 %
Neutro Abs: 6.1 10*3/uL (ref 1.7–7.7)
Neutrophils Relative %: 55 %
Platelets: 172 10*3/uL (ref 150–400)
RBC: 5.15 MIL/uL — ABNORMAL HIGH (ref 3.87–5.11)
RDW: 13.3 % (ref 11.5–15.5)
WBC: 11 10*3/uL — ABNORMAL HIGH (ref 4.0–10.5)
nRBC: 0 % (ref 0.0–0.2)

## 2021-09-27 LAB — LIPASE, BLOOD: Lipase: 29 U/L (ref 11–51)

## 2021-09-27 LAB — POC URINE PREG, ED: Preg Test, Ur: NEGATIVE

## 2021-09-27 MED ORDER — KETOROLAC TROMETHAMINE 30 MG/ML IJ SOLN
30.0000 mg | Freq: Once | INTRAMUSCULAR | Status: AC
  Administered 2021-09-27: 30 mg via INTRAVENOUS
  Filled 2021-09-27: qty 1

## 2021-09-27 MED ORDER — SODIUM CHLORIDE 0.9 % IV SOLN: 1.0000 g | Freq: Once | INTRAVENOUS | Status: DC

## 2021-09-27 MED ORDER — HYDROMORPHONE HCL 1 MG/ML IJ SOLN
0.5000 mg | Freq: Once | INTRAMUSCULAR | Status: AC
  Administered 2021-09-27: 0.5 mg via INTRAVENOUS
  Filled 2021-09-27: qty 0.5

## 2021-09-27 MED ORDER — LORAZEPAM 2 MG/ML IJ SOLN
1.0000 mg | Freq: Once | INTRAMUSCULAR | Status: AC
  Administered 2021-09-27: 1 mg via INTRAVENOUS
  Filled 2021-09-27: qty 1

## 2021-09-27 MED ORDER — HYDROMORPHONE HCL 1 MG/ML IJ SOLN
1.0000 mg | Freq: Once | INTRAMUSCULAR | Status: AC
  Administered 2021-09-27: 1 mg via INTRAVENOUS
  Filled 2021-09-27: qty 1

## 2021-09-27 MED ORDER — METRONIDAZOLE 500 MG/100ML IV SOLN
500.0000 mg | Freq: Once | INTRAVENOUS | Status: AC
  Administered 2021-09-27: 500 mg via INTRAVENOUS
  Filled 2021-09-27: qty 100

## 2021-09-27 MED ORDER — ONDANSETRON 4 MG PO TBDP: 4.0000 mg | ORAL_TABLET | Freq: Four times a day (QID) | ORAL | 0 refills | Status: DC | PRN

## 2021-09-27 MED ORDER — PROCHLORPERAZINE EDISYLATE 10 MG/2ML IJ SOLN
10.0000 mg | Freq: Once | INTRAMUSCULAR | Status: AC
Start: 2021-09-27 — End: 2021-09-27
  Administered 2021-09-27: 10 mg via INTRAVENOUS
  Filled 2021-09-27: qty 2

## 2021-09-27 MED ORDER — GADOBUTROL 1 MMOL/ML IV SOLN
10.0000 mL | Freq: Once | INTRAVENOUS | Status: AC | PRN
  Administered 2021-09-27: 10 mL via INTRAVENOUS

## 2021-09-27 MED ORDER — OXYCODONE-ACETAMINOPHEN 5-325 MG PO TABS: 2.0000 | ORAL_TABLET | Freq: Four times a day (QID) | ORAL | 0 refills | Status: DC | PRN

## 2021-09-27 MED ORDER — ONDANSETRON HCL 4 MG/2ML IJ SOLN
4.0000 mg | Freq: Once | INTRAMUSCULAR | Status: AC
  Administered 2021-09-27: 4 mg via INTRAVENOUS
  Filled 2021-09-27: qty 2

## 2021-09-27 MED ORDER — ACETAMINOPHEN 325 MG PO TABS
650.0000 mg | ORAL_TABLET | Freq: Once | ORAL | Status: AC
  Administered 2021-09-27: 650 mg via ORAL
  Filled 2021-09-27: qty 2

## 2021-09-27 MED ORDER — SODIUM CHLORIDE 0.9 % IV BOLUS (SEPSIS)
1000.0000 mL | Freq: Once | INTRAVENOUS | Status: AC
  Administered 2021-09-27: 1000 mL via INTRAVENOUS

## 2021-09-27 MED ORDER — CIPROFLOXACIN IN D5W 400 MG/200ML IV SOLN
400.0000 mg | Freq: Once | INTRAVENOUS | Status: AC
  Administered 2021-09-27: 400 mg via INTRAVENOUS
  Filled 2021-09-27: qty 200

## 2021-09-27 NOTE — ED Triage Notes (Signed)
RUQ pain starting today at 0215. Radiating to back. Hx of gallstones and kidney stones.

## 2021-09-27 NOTE — Discharge Instructions (Addendum)

## 2021-09-27 NOTE — ED Provider Notes (Addendum)
[facility] Provider Note    Event Date/Time   First MD Initiated Contact with Patient 09/27/21 0345     (approximate)   History   Abdominal Pain   HPI  Courtney Sanchez is a 31 y.o. female with history of gallstones, kidney stones, pseudotumor cerebri, endometriosis who presents to the emergency department with EMS with complaints of right flank pain that started suddenly with nausea.  No vomiting, diarrhea.  Denies fever but states she feels hot.  No dysuria, hematuria, vaginal bleeding or discharge.  States she ate [employer] for dinner.  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   History provided by patient, mother, EMS.    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 Authorizing Provider  albuterol (PROVENTIL HFA;VENTOLIN HFA) 108 (90 Base) MCG/ACT inhaler Inhale 2 puffs into the lungs every 4 (four) hours as needed for wheezing or shortness of breath. 04/05/17   Cuthriell, Delorise Royals, PA-C  buPROPion (WELLBUTRIN XL) 150 MG 24 hr tablet  02/20/18   [provider]  FLUoxetine (PROZAC) 20 MG capsule  02/23/18   [provider]  HUMIRA PEN 40 MG/0.4ML PNKT  02/27/17   [provider]  medroxyPROGESTERone (DEPO-PROVERA) 150 MG/ML injection Inject 1 mL (150 mg total) into the muscle every 3 (three) months. 06/18/21 09/16/21  Mirna Mires, CNM   SUMAtriptan (IMITREX) 25 MG tablet  01/04/18   [provider]  topiramate (TOPAMAX) 25 MG tablet Take by mouth.    [provider]    Physical Exam   Triage Vital Signs: ED Triage Vitals  Enc Vitals Group     BP 09/27/21 0330 (!) 144/86     Pulse Rate 09/27/21 0330 (!) 105     Resp --      Temp 09/27/21 0342 98.5 F (36.9 C)     Temp Source 09/27/21 0342 Oral     SpO2 09/27/21 0330 99 %     Weight --      Height --      Head Circumference --      Peak Flow --      Pain Score 09/27/21 0314 10     Pain Loc --      Pain Edu? --      Excl. in GC? --     Most recent vital signs: Vitals:   09/27/21 0630 09/27/21 0700  BP: 128/89 128/80  Pulse: 83 79  Resp:  17  Temp:    SpO2: 99% 99%    CONSTITUTIONAL: Alert and oriented and responds appropriately to questions.  Appears uncomfortable, moaning in pain, tearful HEAD: Normocephalic, atraumatic EYES: Conjunctivae clear, pupils appear equal, sclera nonicteric ENT: normal nose; moist mucous membranes NECK: Supple,  normal ROM CARD: RRR; S1 and S2 appreciated; no murmurs, no clicks, no rubs, no gallops RESP: Normal chest excursion without splinting or tachypnea; breath sounds clear and equal bilaterally; no wheezes, no rhonchi, no rales, no hypoxia or respiratory distress, speaking full sentences ABD/GI: Normal bowel sounds; non-distended; soft, tender in the right upper quadrant and right flank without guarding or rebound, negative Murphy sign, no tenderness at McBurney's point BACK: The back appears normal EXT: Normal ROM in all joints; no deformity noted, no edema; no cyanosis SKIN: Normal color for age and race; warm; no rash on exposed skin NEURO: Moves all extremities equally, normal speech PSYCH: The patient's mood and manner are appropriate.   ED Results / Procedures / Treatments   LABS: (all labs ordered are listed, but only abnormal results are displayed) Labs Reviewed  URINALYSIS, ROUTINE W  REFLEX MICROSCOPIC - Abnormal; Notable for the following components:      Result Value   Color, Urine YELLOW (*)    APPearance CLOUDY (*)    Leukocytes,Ua TRACE (*)    Bacteria, UA RARE (*)    All other components within normal limits  CBC WITH DIFFERENTIAL/PLATELET - Abnormal; Notable for the following components:   WBC 11.0 (*)    RBC 5.15 (*)    Hemoglobin 15.1 (*)    All other components within normal limits  COMPREHENSIVE METABOLIC PANEL - Abnormal; Notable for the following components:   Potassium 3.3 (*)    Chloride 114 (*)    Calcium 8.2 (*)    Total Protein 6.1 (*)    AST 13 (*)    All other components within normal limits  LIPASE, BLOOD  POC URINE PREG, ED     EKG:   RADIOLOGY: My personal review and interpretation of imaging: CT scan shows kidney stones within the kidneys but no ureterolithiasis.  Multiple gallstones without cholecystitis.  I have personally reviewed all radiology reports.   US ABDOMEN LIMITED RUQ (LIVER/GB)  Result Date: 09/27/2021 CLINICAL DATA:  31 year old female with right upper quadrant abdominal pain. Cholelithiasis. EXAM: ULTRASOUND ABDOMEN LIMITED RIGHT UPPER QUADRANT COMPARISON:  CT Abdomen and Pelvis 0521 hours today. FINDINGS: Gallbladder: Small shadowing echogenic gallstones individually up to 7 mm. Gallbladder wall thickness remains normal. No pericholecystic fluid. No sonographic Murphy sign elicited. Common bile duct: Diameter: 8-9 mm, dilated (images 21 and 22). No visible ductal filling defect. Liver: No focal lesion identified. Within normal limits in parenchymal echogenicity. No intrahepatic ductal dilatation identified. Portal vein is patent on color Doppler imaging with normal direction of blood flow towards the liver. Other: Atrophied right kidney. IMPRESSION: 1. Positive for cholelithiasis with mildly to moderately dilated CBD (8-9 mm). Constellation is suspicious for acute Choledocholithiasis. 2. No evidence of acute cholecystitis.  Negative ultrasound appearance of the liver. 3. Atrophied right kidney. Electronically Signed   By: Genevie Ann M.D.   On: 09/27/2021 07:22   CT Renal Stone Study  Result Date: 09/27/2021 CLINICAL DATA:  31 year old female with history of right upper quadrant abdominal pain since 02/15 this morning with radiation to the back. History of gallstones and kidney stones. EXAM: CT ABDOMEN AND PELVIS WITHOUT CONTRAST TECHNIQUE: Multidetector CT imaging of the abdomen and pelvis was performed following the standard protocol without IV contrast. RADIATION DOSE REDUCTION: This exam was performed according to the departmental dose-optimization program which includes automated exposure control, adjustment of the mA and/or kV according to patient size and/or use of iterative reconstruction technique. COMPARISON:  CT of the abdomen and  pelvis 04/08/2007. FINDINGS: Lower chest: Orthopedic fixation hardware in the lower thoracic spine. Hepatobiliary: No definite suspicious cystic or solid hepatic lesions are confidently identified on today's noncontrast CT examination. Tiny calcified gallstone in the fundus of the gallbladder. Gallbladder is otherwise unremarkable in appearance. Pancreas: No definite pancreatic mass or peripancreatic fluid collections or inflammatory changes are noted on today's noncontrast CT examination. Spleen: Unenhanced appearance of the spleen is unremarkable. Adrenals/Urinary Tract: Multiple small nonobstructive calculi are noted in the collecting systems of both kidneys, largest of which measures up to 5 mm in the lower pole collecting system of the left kidney. No additional calculi are identified along the course of either ureter or within the lumen of the urinary bladder. No hydroureteronephrosis. Moderate to severe right renal atrophy. Unenhanced appearance of the left kidney and bilateral adrenal glands is otherwise unremarkable. Urinary bladder is unremarkable in appearance. Stomach/Bowel: Unenhanced  appearance of the stomach is normal. No pathologic dilatation of small bowel or colon. Normal appendix. Vascular/Lymphatic: No atherosclerotic calcifications are noted in the abdominal aorta or pelvic vasculature. No lymphadenopathy noted in the abdomen or pelvis. Reproductive: Unenhanced appearance of the uterus and ovaries is unremarkable. Other: No significant volume of ascites.  No pneumoperitoneum. Musculoskeletal: Orthopedic fixation hardware noted throughout the lower thoracic and upper lumbar spine creating extensive beam hardening artifact limiting evaluation of adjacent structures. There are no aggressive appearing lytic or blastic lesions noted in the visualized portions of the skeleton. IMPRESSION: 1. Multiple nonobstructive calculi in the collecting systems of both kidneys measuring up to 5 mm in the lower pole collecting system of left kidney. No ureteral stones or findings of urinary tract obstruction are noted at this time. 2. Cholelithiasis, without evidence of acute cholecystitis. 3. Additional incidental findings, as above. Electronically Signed   By: Trudie Reed M.D.   On: 09/27/2021 05:55     PROCEDURES:  Critical Care performed: No     Procedures    IMPRESSION / MDM / ASSESSMENT AND PLAN / ED COURSE  I reviewed the triage vital signs and the nursing notes.    Patient here with right-sided flank pain and right upper quadrant abdominal pain with nausea.  The patient is on the cardiac monitor to evaluate for evidence of arrhythmia and/or significant heart rate changes.   DIFFERENTIAL DIAGNOSIS (includes but not limited to):   Gallstones, cholecystitis, cholangitis, choledocholithiasis, pancreatitis, kidney stones, pyelonephritis, ascending UTI, doubt appendicitis   Patient's presentation is most consistent with acute presentation with potential threat to life or bodily function.   PLAN: We will obtain CBC, CMP, lipase, urinalysis, urine pregnancy test, CT of the  abdomen pelvis.  Will give IV fluids, pain and nausea medicine.   MEDICATIONS GIVEN IN ED: Medications  ertapenem (INVANZ) 1,000 mg in sodium chloride 0.9 % 100 mL IVPB (has no administration in time range)  sodium chloride 0.9 % bolus 1,000 mL (0 mLs Intravenous Stopped 09/27/21 0730)  HYDROmorphone (DILAUDID) injection 1 mg (1 mg Intravenous Given 09/27/21 0451)  ondansetron (ZOFRAN) injection 4 mg (4 mg Intravenous Given 09/27/21 0451)  ketorolac (TORADOL) 30 MG/ML injection 30 mg (30 mg Intravenous Given 09/27/21 0452)  HYDROmorphone (DILAUDID) injection 0.5 mg (0.5 mg Intravenous Given 09/27/21 0731)     ED COURSE: Patient's labs show leukocytosis of 11,000.  Urine shows some red blood cells but no other sign of infection.  CT of the abdomen pelvis reviewed and interpreted by myself and the radiologist shows multiple stones within the kidneys but no ureterolithiasis or  hydronephrosis.  She has cholelithiasis without cholecystitis.  Given leukocytosis and significant pain here, will obtain right upper quadrant ultrasound.  CMP and lipase hemolyzed and will need to be redrawn.  Pain is improved after Dilaudid and Toradol.    Patient reports pain is returning slightly.  Will give another dose of Dilaudid.  Patient has normal creatinine, LFTs, lipase.  Right upper quadrant ultrasound pending.   Patient and mother updated with plan.   Right upper quadrant ultrasound reviewed and interpreted by myself and radiologist and shows cholecystitis and concerns for choledocholithiasis.  Discussed with pharmacist given patient has multiple drug allergies.  We will give cipro and flagyl per pharmacy recommendations.  Will obtain MRCP.  Signed out the oncoming ED physician.  Patient and mother have been updated.  CONSULTS: Patient will likely need admission to the hospital if MRCP confirms US findings.   OUTSIDE RECORDS REVIEWED: Reviewed patient's previous surgical notes at [facility] in 2019.       FINAL  CLINICAL IMPRESSION(S) / ED DIAGNOSES   Final diagnoses:  Right flank pain  Gallstones  Choledocholithiasis with acute cholecystitis     Rx / DC Orders   ED Discharge Orders          Ordered    oxyCODONE-acetaminophen (PERCOCET) 5-325 MG tablet  Every 6 hours PRN        09/27/21 0717    ondansetron (ZOFRAN-ODT) 4 MG disintegrating tablet  Every 6 hours PRN        09/27/21 0717             Note:  This document was prepared using Dragon voice recognition software and may include unintentional dictation errors.       Anmol Fleck, Delice Bison, DO 09/27/21 [phone]

## 2021-09-27 NOTE — ED Provider Notes (Signed)
Emergency department handoff note  Care of this patient was signed out to me at the end of the previous provider shift.  All pertinent patient information was conveyed and all questions were answered.  Patient pending results of the CT abdomen and pelvis that showed possible choledocholithiasis.  Patient was scheduled for MRCP that showed possible acute cholecystitis.  I spoke to Dr. Aleen Campi in general surgery who assessed this patient and found her to be pain-free at this time and therefore recommended p.o. challenge prior to discharge with follow-up in his office on Monday for definitive management of her cholelithiasis and likely gallbladder colic.  Care of this patient will be signed out to the oncoming physician at the end of my shift.  All pertinent patient information conveyed and all questions answered.  All further care and disposition decisions will be made by the oncoming physician.   Merwyn Katos, MD 09/27/21 [phone]

## 2021-09-27 NOTE — Consult Note (Signed)
Date of Consultation:  09/27/2021  Requesting Physician:  Donna Bernard, MD  Reason for Consultation:  RUQ abdominal pain  History of Present Illness: Courtney Sanchez is a 31 y.o. female presenting for evaluation of RUQ abdominal pain.  The patient reports that she ate [employer] last night and then woke up overnight with RUQ abdominal pain.  Associated with nausea but no emesis.  The pain was very severe and she presented to the ED.  She has had prior episodes of RUQ pain, and reports a few trips to the ER here and at [facility] for the same.  She has known cholelithiasis.  She reports she eats [employer] very often but she does not get pain after each time.  Denies any fevers, chills, chest pain, shortness of breath.  In the ED, she initially had a CT abdomen/pelvis which showed bilateral kidney stones that were non-obstructing, as well as cholelithiasis.  U/S of RUQ again confirmed cholelithiasis with normal wall thickness and no pericholecystic fluid, but her CBD was somewhat dilated for her age.  MRCP was then obtained which showed cholelithiasis with mild gallbladder wall thickening and pericholecystic fluid.  No choledocholithiasis was identified.    At the time of my evaluation, the patient reports that she is pain free.  Denies any nausea and she reports she's hungry.  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 Authorizing Provider  ondansetron (ZOFRAN-ODT) 4 MG disintegrating tablet Take 1 tablet (4 mg total) by mouth every 6 (six) hours as needed for nausea or vomiting. 09/27/21  Yes Ward, Layla Maw, DO  oxyCODONE-acetaminophen (PERCOCET) 5-325 MG tablet Take 2 tablets by mouth every 6 (six) hours as needed for severe pain. 09/27/21 09/27/22 Yes Ward, Kristen N, DO  albuterol (PROVENTIL HFA;VENTOLIN HFA) 108 (90 Base) MCG/ACT inhaler Inhale 2 puffs into the lungs every 4 (four) hours as needed for wheezing or shortness of breath. 04/05/17   Cuthriell, Delorise Royals, PA-C  buPROPion (WELLBUTRIN XL) 150 MG 24 hr tablet  02/20/18   [provider]  FLUoxetine (PROZAC) 20 MG capsule  02/23/18   [provider]  HUMIRA PEN 40 MG/0.4ML PNKT  02/27/17   [provider]  medroxyPROGESTERone (DEPO-PROVERA) 150 MG/ML injection Inject 1 mL (150 mg total) into the muscle every 3 (three) months. 06/18/21 09/16/21  Mirna Mires, CNM  SUMAtriptan (IMITREX) 25 MG tablet  01/04/18   [provider]  topiramate (TOPAMAX) 25 MG tablet Take by mouth.    [provider]    Allergies: Allergies  Allergen Reactions   Amoxicillin Rash   Cefaclor Rash   Cefadroxil Rash   Cefuroxime Rash   Cephalexin Rash   Clarithromycin Rash   Clindamycin Anaphylaxis  Doxycycline Swelling    Optic pressure_fluid swelling   Erythromycin Rash   Penicillins Rash   Sulfamethoxazole-Trimethoprim Rash   Vitamin A     Other reaction(s): Other (See Comments) Causes pressure in brain to go up and she has to have lumbar puncture to draw fluid off.   Dextromethorphan Hbr Rash and Swelling   Morphine Itching   Other     Other reaction(s): Other (See Comments) "Pretty much every antibiotic there is" "Pretty much every antibiotic there is"    Azithromycin Rash   Red Dye Rash    Social History:  reports that she has been smoking cigarettes. She has been smoking an  average of .5 packs per day. She has never used smokeless tobacco. She reports that she does not drink alcohol and does not use drugs.   Family History: Family History  Problem Relation Age of Onset   Bipolar disorder Mother    Depression Mother    Breast cancer Mother 27   Cancer Father        Kidney   Depression Maternal Grandmother    Breast cancer Maternal Grandmother 18       contact/    Review of Systems: Review of Systems  Constitutional:  Negative for chills and fever.  HENT:  Negative for hearing loss.   Respiratory:  Negative for shortness of breath.   Cardiovascular:  Negative for chest pain.  Gastrointestinal:  Positive for abdominal pain and nausea. Negative for constipation, diarrhea and vomiting.  Genitourinary:  Negative for dysuria.  Musculoskeletal:  Negative for myalgias.  Skin:  Negative for rash.  Neurological:  Negative for dizziness.  Psychiatric/Behavioral:  Negative for depression.     Physical Exam BP (!) 153/81 (BP Location: Right Arm)   Pulse 90   Temp 98.2 F (36.8 C)   Resp 16   SpO2 99%  CONSTITUTIONAL: No acute distress. HEENT:  Normocephalic, atraumatic, extraocular motion intact. NECK: Trachea is midline, and there is no jugular venous distension. RESPIRATORY:  Normal respiratory effort without pathologic use of accessory muscles. CARDIOVASCULAR: Regular rhythm and rate. GI: The abdomen is soft, non-distended, non-tender to palpation with only some residual soreness.  Negative Murphy's sign with the last pain medication dose about 8 hrs prior.  MUSCULOSKELETAL:  Normal muscle strength and tone in all four extremities.  No peripheral edema or cyanosis. SKIN: Skin turgor is normal. There are no pathologic skin lesions.  NEUROLOGIC:  Motor and sensation is grossly normal.  Cranial nerves are grossly intact. PSYCH:  Alert and oriented to person, place and time. Affect is normal.  Laboratory Analysis: Results for orders placed or performed  during the hospital encounter of 09/27/21 (from the past 24 hour(s))  Urinalysis, Routine w reflex microscopic Urine, Clean Catch     Status: Abnormal   Collection Time: 09/27/21  3:30 AM  Result Value Ref Range   Color, Urine YELLOW (A) YELLOW   APPearance CLOUDY (A) CLEAR   Specific Gravity, Urine 1.015 1.005 - 1.030   pH 6.0 5.0 - 8.0   Glucose, UA NEGATIVE NEGATIVE mg/dL   Hgb urine dipstick NEGATIVE NEGATIVE   Bilirubin Urine NEGATIVE NEGATIVE   Ketones, ur NEGATIVE NEGATIVE mg/dL   Protein, ur NEGATIVE NEGATIVE mg/dL   Nitrite NEGATIVE NEGATIVE   Leukocytes,Ua TRACE (A) NEGATIVE   RBC / HPF 6-10 0 - 5 RBC/hpf   WBC, UA 0-5 0 - 5 WBC/hpf   Bacteria, UA RARE (A) NONE SEEN   Squamous Epithelial / LPF 6-10  0 - 5   Mucus PRESENT    Amorphous Crystal PRESENT   CBC with Differential     Status: Abnormal   Collection Time: 09/27/21  3:30 AM  Result Value Ref Range   WBC 11.0 (H) 4.0 - 10.5 K/uL   RBC 5.15 (H) 3.87 - 5.11 MIL/uL   Hemoglobin 15.1 (H) 12.0 - 15.0 g/dL   HCT 08.6 57.8 - 46.9 %   MCV 87.0 80.0 - 100.0 fL   MCH 29.3 26.0 - 34.0 pg   MCHC 33.7 30.0 - 36.0 g/dL   RDW 62.9 52.8 - 41.3 %   Platelets 172 150 - 400 K/uL   nRBC 0.0 0.0 - 0.2 %   Neutrophils Relative % 55 %   Neutro Abs 6.1 1.7 - 7.7 K/uL   Lymphocytes Relative 32 %   Lymphs Abs 3.5 0.7 - 4.0 K/uL   Monocytes Relative 8 %   Monocytes Absolute 0.8 0.1 - 1.0 K/uL   Eosinophils Relative 4 %   Eosinophils Absolute 0.4 0.0 - 0.5 K/uL   Basophils Relative 1 %   Basophils Absolute 0.1 0.0 - 0.1 K/uL   Immature Granulocytes 0 %   Abs Immature Granulocytes 0.04 0.00 - 0.07 K/uL  POC Urine Pregnancy, ED     Status: None   Collection Time: 09/27/21  3:40 AM  Result Value Ref Range   Preg Test, Ur Negative Negative  Comprehensive metabolic panel     Status: Abnormal   Collection Time: 09/27/21  6:13 AM  Result Value Ref Range   Sodium 141 135 - 145 mmol/L   Potassium 3.3 (L) 3.5 - 5.1 mmol/L   Chloride  114 (H) 98 - 111 mmol/L   CO2 22 22 - 32 mmol/L   Glucose, Bld 95 70 - 99 mg/dL   BUN 12 6 - 20 mg/dL   Creatinine, Ser 2.44 0.44 - 1.00 mg/dL   Calcium 8.2 (L) 8.9 - 10.3 mg/dL   Total Protein 6.1 (L) 6.5 - 8.1 g/dL   Albumin 3.5 3.5 - 5.0 g/dL   AST 13 (L) 15 - 41 U/L   ALT 14 0 - 44 U/L   Alkaline Phosphatase 78 38 - 126 U/L   Total Bilirubin 0.4 0.3 - 1.2 mg/dL   GFR, Estimated >01 >02 mL/min   Anion gap 5 5 - 15  Lipase, blood     Status: None   Collection Time: 09/27/21  6:13 AM  Result Value Ref Range   Lipase 29 11 - 51 U/L    Imaging: MR ABDOMEN MRCP W WO CONTAST  Result Date: 09/27/2021 CLINICAL DATA:  Cholelithiasis concern for choledocholithiasis. EXAM: MRI ABDOMEN WITHOUT AND WITH CONTRAST (INCLUDING MRCP) TECHNIQUE: Multiplanar multisequence MR imaging of the abdomen was performed both before and after the administration of intravenous contrast. Heavily T2-weighted images of the biliary and pancreatic ducts were obtained, and three-dimensional MRCP images were rendered by post processing. CONTRAST:  10mL GADAVIST GADOBUTROL 1 MMOL/ML IV SOLN COMPARISON:  Ultrasound right upper quadrant and CT abdomen pelvis dated September 27, 2021 FINDINGS: Lower chest: No acute abnormality. Hepatobiliary: No significant hepatic steatosis. Mild hepatomegaly measuring 20.9 cm. No suspicious hepatic lesion. Cholelithiasis in a nondistended gallbladder with mild gallbladder wall thickening and pericholecystic fluid. Prominence of the extrahepatic biliary tree with the common duct measuring 6 mm on image 18/3 with gentle tapering of the duct to the level of the ampulla. No choledocholithiasis identified. Pancreas: No pancreatic ductal dilation or evidence of acute inflammation.  Spleen:  No splenomegaly. Adrenals/Urinary Tract: Bilateral adrenal glands appear normal. No hydronephrosis. Right renal atrophy. No suspicious renal mass. Stomach/Bowel: Visualized portions within the abdomen are unremarkable.  Vascular/Lymphatic: No pathologically enlarged lymph nodes identified. No abdominal aortic aneurysm demonstrated. Other:  None. Musculoskeletal: Posterior spinal fusion hardware. IMPRESSION: 1. Cholelithiasis with MRI findings equivocal for acute cholecystitis. Consider further evaluation with nuclear medicine HIDA scan if clinical concern for cholecystitis. 2. Prominence of the extrahepatic biliary tree with the common duct measuring 6 mm but gentle tapering of the duct to the level of the ampulla and no choledocholithiasis identified. 3. Mild hepatomegaly without hepatic steatosis. Electronically Signed   By: Maudry Mayhew M.D.   On: 09/27/2021 12:31   MR 3D Recon At Scanner  Result Date: 09/27/2021 CLINICAL DATA:  Cholelithiasis concern for choledocholithiasis. EXAM: MRI ABDOMEN WITHOUT AND WITH CONTRAST (INCLUDING MRCP) TECHNIQUE: Multiplanar multisequence MR imaging of the abdomen was performed both before and after the administration of intravenous contrast. Heavily T2-weighted images of the biliary and pancreatic ducts were obtained, and three-dimensional MRCP images were rendered by post processing. CONTRAST:  52mL GADAVIST GADOBUTROL 1 MMOL/ML IV SOLN COMPARISON:  Ultrasound right upper quadrant and CT abdomen pelvis dated September 27, 2021 FINDINGS: Lower chest: No acute abnormality. Hepatobiliary: No significant hepatic steatosis. Mild hepatomegaly measuring 20.9 cm. No suspicious hepatic lesion. Cholelithiasis in a nondistended gallbladder with mild gallbladder wall thickening and pericholecystic fluid. Prominence of the extrahepatic biliary tree with the common duct measuring 6 mm on image 18/3 with gentle tapering of the duct to the level of the ampulla. No choledocholithiasis identified. Pancreas: No pancreatic ductal dilation or evidence of acute inflammation. Spleen:  No splenomegaly. Adrenals/Urinary Tract: Bilateral adrenal glands appear normal. No hydronephrosis. Right renal atrophy. No  suspicious renal mass. Stomach/Bowel: Visualized portions within the abdomen are unremarkable. Vascular/Lymphatic: No pathologically enlarged lymph nodes identified. No abdominal aortic aneurysm demonstrated. Other:  None. Musculoskeletal: Posterior spinal fusion hardware. IMPRESSION: 1. Cholelithiasis with MRI findings equivocal for acute cholecystitis. Consider further evaluation with nuclear medicine HIDA scan if clinical concern for cholecystitis. 2. Prominence of the extrahepatic biliary tree with the common duct measuring 6 mm but gentle tapering of the duct to the level of the ampulla and no choledocholithiasis identified. 3. Mild hepatomegaly without hepatic steatosis. Electronically Signed   By: Maudry Mayhew M.D.   On: 09/27/2021 12:31   US ABDOMEN LIMITED RUQ (LIVER/GB)  Result Date: 09/27/2021 CLINICAL DATA:  31 year old female with right upper quadrant abdominal pain. Cholelithiasis. EXAM: ULTRASOUND ABDOMEN LIMITED RIGHT UPPER QUADRANT COMPARISON:  CT Abdomen and Pelvis 0521 hours today. FINDINGS: Gallbladder: Small shadowing echogenic gallstones individually up to 7 mm. Gallbladder wall thickness remains normal. No pericholecystic fluid. No sonographic Murphy sign elicited. Common bile duct: Diameter: 8-9 mm, dilated (images 21 and 22). No visible ductal filling defect. Liver: No focal lesion identified. Within normal limits in parenchymal echogenicity. No intrahepatic ductal dilatation identified. Portal vein is patent on color Doppler imaging with normal direction of blood flow towards the liver. Other: Atrophied right kidney. IMPRESSION: 1. Positive for cholelithiasis with mildly to moderately dilated CBD (8-9 mm). Constellation is suspicious for acute Choledocholithiasis. 2. No evidence of acute cholecystitis. Negative ultrasound appearance of the liver. 3. Atrophied right kidney. Electronically Signed   By: Odessa Fleming M.D.   On: 09/27/2021 07:22   CT Renal Stone Study  Result Date:  09/27/2021 CLINICAL DATA:  31 year old female with history of right upper quadrant abdominal pain since 02/15 this morning  with radiation to the back. History of gallstones and kidney stones. EXAM: CT ABDOMEN AND PELVIS WITHOUT CONTRAST TECHNIQUE: Multidetector CT imaging of the abdomen and pelvis was performed following the standard protocol without IV contrast. RADIATION DOSE REDUCTION: This exam was performed according to the departmental dose-optimization program which includes automated exposure control, adjustment of the mA and/or kV according to patient size and/or use of iterative reconstruction technique. COMPARISON:  CT of the abdomen and pelvis 04/08/2007. FINDINGS: Lower chest: Orthopedic fixation hardware in the lower thoracic spine. Hepatobiliary: No definite suspicious cystic or solid hepatic lesions are confidently identified on today's noncontrast CT examination. Tiny calcified gallstone in the fundus of the gallbladder. Gallbladder is otherwise unremarkable in appearance. Pancreas: No definite pancreatic mass or peripancreatic fluid collections or inflammatory changes are noted on today's noncontrast CT examination. Spleen: Unenhanced appearance of the spleen is unremarkable. Adrenals/Urinary Tract: Multiple small nonobstructive calculi are noted in the collecting systems of both kidneys, largest of which measures up to 5 mm in the lower pole collecting system of the left kidney. No additional calculi are identified along the course of either ureter or within the lumen of the urinary bladder. No hydroureteronephrosis. Moderate to severe right renal atrophy. Unenhanced appearance of the left kidney and bilateral adrenal glands is otherwise unremarkable. Urinary bladder is unremarkable in appearance. Stomach/Bowel: Unenhanced appearance of the stomach is normal. No pathologic dilatation of small bowel or colon. Normal appendix. Vascular/Lymphatic: No atherosclerotic calcifications are noted in the  abdominal aorta or pelvic vasculature. No lymphadenopathy noted in the abdomen or pelvis. Reproductive: Unenhanced appearance of the uterus and ovaries is unremarkable. Other: No significant volume of ascites.  No pneumoperitoneum. Musculoskeletal: Orthopedic fixation hardware noted throughout the lower thoracic and upper lumbar spine creating extensive beam hardening artifact limiting evaluation of adjacent structures. There are no aggressive appearing lytic or blastic lesions noted in the visualized portions of the skeleton. IMPRESSION: 1. Multiple nonobstructive calculi in the collecting systems of both kidneys measuring up to 5 mm in the lower pole collecting system of left kidney. No ureteral stones or findings of urinary tract obstruction are noted at this time. 2. Cholelithiasis, without evidence of acute cholecystitis. 3. Additional incidental findings, as above. Electronically Signed   By: Trudie Reedaniel  Entrikin M.D.   On: 09/27/2021 05:55    Assessment and Plan: This is a [age] y.o. female with biliary colic  --Discussed with the patient the findings on her labs and imaging studies.  Her LFTs are unremarkable, and her WBC is only minimally elevated to 11.  Her U/S did not show any inflammatory changes, and her MRCP did show some.  However, on my evaluation, she is symptom free and is hungry.  I think she may have had a transient cystic duct obstruction and now has resolved.  This was likely triggered by her [employer] meal last night. Discussed how the gallbladder works to help with greasy/fatty foods and how her gallstones can create her episodes of pain.   --Discussed that given her symptoms have resolved, we could do a trial of po intake and if she remains pain free, can be discharged with close follow up with me next week to schedule her for elective outpatient surgery.  If she were to get pain after po challenge, then would admit and discuss surgery.  Briefly discussed that surgery would be in the form  of robotic cholecystectomy, and discussed the incisions and post-op activity restrictions. --She tried po intake and remains pain free. --She can follow  up with me on Monday 7/31 in the morning.  Our office will contact her to schedule appointment first thing.  Advised her to keep a low fat diet and return precautions given.  All of her questions have been answered.   Howie Ill, MD [facility] Pg:  [phone]

## 2021-09-30 ENCOUNTER — Telehealth: Payer: Self-pay | Admitting: *Deleted

## 2021-09-30 NOTE — Telephone Encounter (Signed)
Left message for patient to call the office to schedule an appointment with [facility]- she was seen in ER for gallbladder

## 2021-10-07 ENCOUNTER — Ambulatory Visit: Payer: Medicaid Other | Admitting: Surgery

## 2021-10-14 ENCOUNTER — Ambulatory Visit: Payer: Medicaid Other | Admitting: Surgery

## 2022-01-12 ENCOUNTER — Other Ambulatory Visit: Payer: Self-pay | Admitting: Obstetrics

## 2022-01-12 DIAGNOSIS — Z01419 Encounter for gynecological examination (general) (routine) without abnormal findings: Secondary | ICD-10-CM

## 2022-01-12 DIAGNOSIS — Z3042 Encounter for surveillance of injectable contraceptive: Secondary | ICD-10-CM

## 2022-01-13 ENCOUNTER — Telehealth: Payer: Self-pay

## 2022-01-13 NOTE — Telephone Encounter (Signed)
Patient called triage line stating she needs her Depo refill, per patient [facility] told her she hasn't had it since April. 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   She has an annual scheduled for 02/20/2022 with Isabelle Course.   Crystal are we still able to send in prescription since we have it in stock?

## 2022-01-13 NOTE — Telephone Encounter (Signed)
No longer needs a prescription

## 2022-02-20 ENCOUNTER — Ambulatory Visit: Payer: Medicaid Other | Admitting: Licensed Practical Nurse

## 2022-03-30 ENCOUNTER — Other Ambulatory Visit: Payer: Self-pay

## 2022-03-30 ENCOUNTER — Emergency Department
Admission: EM | Admit: 2022-03-30 | Discharge: 2022-03-31 | Disposition: A | Discharge status: Home / Self Care | Payer: Medicaid Other | Attending: Emergency Medicine | Admitting: Emergency Medicine

## 2022-03-30 DIAGNOSIS — R110 Nausea: Secondary | ICD-10-CM

## 2022-03-30 DIAGNOSIS — U071 COVID-19: Secondary | ICD-10-CM | POA: Diagnosis not present

## 2022-03-30 LAB — COMPREHENSIVE METABOLIC PANEL
ALT: 56 U/L — ABNORMAL HIGH (ref 0–44)
AST: 33 U/L (ref 15–41)
Albumin: 3.8 g/dL (ref 3.5–5.0)
Alkaline Phosphatase: 88 U/L (ref 38–126)
Anion gap: 9 (ref 5–15)
BUN: 11 mg/dL (ref 6–20)
CO2: 23 mmol/L (ref 22–32)
Calcium: 8.8 mg/dL — ABNORMAL LOW (ref 8.9–10.3)
Chloride: 108 mmol/L (ref 98–111)
Creatinine, Ser: 0.82 mg/dL (ref 0.44–1.00)
GFR, Estimated: >60 mL/min (ref >60)
Glucose, Bld: 112 mg/dL — ABNORMAL HIGH (ref 70–99)
Potassium: 3.9 mmol/L (ref 3.5–5.1)
Sodium: 140 mmol/L (ref 135–145)
Total Bilirubin: 0.6 mg/dL (ref 0.3–1.2)
Total Protein: 6.9 g/dL (ref 6.5–8.1)

## 2022-03-30 LAB — URINALYSIS, ROUTINE W REFLEX MICROSCOPIC
Bilirubin Urine: NEGATIVE
Glucose, UA: NEGATIVE mg/dL
Hgb urine dipstick: NEGATIVE
Ketones, ur: NEGATIVE mg/dL
Nitrite: NEGATIVE
Protein, ur: NEGATIVE mg/dL
Specific Gravity, Urine: 1.015 (ref 1.005–1.030)
pH: 7 (ref 5.0–8.0)

## 2022-03-30 LAB — CBC
HCT: 44.6 % (ref 36.0–46.0)
Hemoglobin: 14.8 g/dL (ref 12.0–15.0)
MCH: 29.4 pg (ref 26.0–34.0)
MCHC: 33.2 g/dL (ref 30.0–36.0)
MCV: 88.5 fL (ref 80.0–100.0)
Platelets: 360 10*3/uL (ref 150–400)
RBC: 5.04 MIL/uL (ref 3.87–5.11)
RDW: 12.7 % (ref 11.5–15.5)
WBC: 9.2 10*3/uL (ref 4.0–10.5)
nRBC: 0 % (ref 0.0–0.2)

## 2022-03-30 LAB — LIPASE, BLOOD: Lipase: 27 U/L (ref 11–51)

## 2022-03-30 LAB — POC URINE PREG, ED: Preg Test, Ur: NEGATIVE

## 2022-03-30 MED ORDER — SODIUM CHLORIDE 0.9 % IV BOLUS
1000.0000 mL | Freq: Once | INTRAVENOUS | Status: AC
  Administered 2022-03-30: 1000 mL via INTRAVENOUS

## 2022-03-30 MED ORDER — PROCHLORPERAZINE EDISYLATE 10 MG/2ML IJ SOLN
10.0000 mg | Freq: Once | INTRAMUSCULAR | Status: AC
  Administered 2022-03-30: 10 mg via INTRAVENOUS
  Filled 2022-03-30: qty 2

## 2022-03-30 NOTE — ED Triage Notes (Signed)
Pt to ED from home via EMS for N/V x2 days. Pt was seen today at [facility] for same. Pt was diagnosed with COVID a week ago. UC saw her today and gave her an IM injection of toradol and DC her home. Pt called 911 today for same. Pt is CAOx4 and in no acute distress at this time.

## 2022-03-30 NOTE — ED Provider Notes (Signed)
[facility] Provider Note    Event Date/Time   First MD Initiated Contact with Patient 03/30/22 2306     (approximate)   History   Emesis   HPI  Courtney Sanchez is a 32 y.o. female   who presents to the emergency department today because of concerns for not feeling well.  She states she was diagnosed with COVID about a week and a half ago.  She says over the past few days she has been feeling worse.  She has been having some nausea with upper abdominal discomfort.  She has been able to tolerate p.o.  Additionally she has been having a headache.  This however might be related to a dental infection.  She was given prescription for antibiotics but has not been taking them regularly.  She went to urgent care earlier today and was given Zofran and Toradol without any significant relief.      Physical Exam   Triage Vital Signs: ED Triage Vitals [03/30/22 2213]  Enc Vitals Group     BP (!) 154/102     Pulse Rate 90     Resp 16     Temp 97.9 F (36.6 C)     Temp Source Oral     SpO2 99 %     Weight 255 lb (115.7 kg)     Height 5\' 5"  (1.651 m)     Head Circumference      Peak Flow      Pain Score 8     Pain Loc      Pain Edu?      Excl. in [place]?     Most recent vital signs: Vitals:   03/30/22 2213  BP: (!) 154/102  Pulse: 90  Resp: 16  Temp: 97.9 F (36.6 C)  SpO2: 99%    General: Awake, alert, oriented. CV:  Good peripheral perfusion. Regular rate and rhythm. Resp:  Normal effort. Non tender. Abd:  No distention.     ED Results / Procedures / Treatments   Labs (all labs ordered are listed, but only abnormal results are displayed) Labs Reviewed  RESP PANEL BY RT-PCR (RSV, FLU A&B, COVID)  RVPGX2 - Abnormal; Notable for the following components:      Result Value   SARS Coronavirus 2 by RT PCR POSITIVE (*)    All other components within normal limits  COMPREHENSIVE METABOLIC PANEL - Abnormal; Notable for the following components:    Glucose, Bld 112 (*)    Calcium 8.8 (*)    ALT 56 (*)    All other components within normal limits  URINALYSIS, ROUTINE W REFLEX MICROSCOPIC - Abnormal; Notable for the following components:   Color, Urine YELLOW (*)    APPearance HAZY (*)    Leukocytes,Ua TRACE (*)    Bacteria, UA RARE (*)    All other components within normal limits  LIPASE, BLOOD  CBC  POC URINE PREG, ED     EKG  None   RADIOLOGY None   PROCEDURES:  Critical Care performed: No  Procedures   MEDICATIONS ORDERED IN ED: Medications - No data to display   IMPRESSION / MDM / [place] / ED COURSE  I reviewed the triage vital signs and the nursing notes.                              Differential diagnosis includes, but is not limited to, viral  illness, pancreatitis, hepatitis  Patient's presentation is most consistent with acute presentation with potential threat to life or bodily function.  Emergency department today with primary concern for nausea vomiting as well as some headache in setting of a recent COVID diagnosis.  Blood work without concerning leukocytosis or electrolyte abnormality.  Patient did test positive for COVID here.  She was given IV medication and did feel improvement.  This time I think likely patient's symptoms are secondary to COVID.  I discussed this with the patient.  Will discharge with antiemetic.   FINAL CLINICAL IMPRESSION(S) / ED DIAGNOSES   Final diagnoses:  Nausea  COVID-19     Note:  This document was prepared using Dragon voice recognition software and may include unintentional dictation errors.    Nance Pear, MD 03/31/22 [phone]

## 2022-03-30 NOTE — ED Triage Notes (Signed)
Pt from  home via ems with reports of testing positive for covid 1.5 weeks ago, has been having N/V and headache x 2 days.  4mg  zofran given PTA.

## 2022-03-31 LAB — RESP PANEL BY RT-PCR (RSV, FLU A&B, COVID)  RVPGX2
Influenza A by PCR: NEGATIVE
Influenza B by PCR: NEGATIVE
Resp Syncytial Virus by PCR: NEGATIVE
SARS Coronavirus 2 by RT PCR: POSITIVE — AB

## 2022-03-31 MED ORDER — FLUCONAZOLE 150 MG PO TABS: 150.0000 mg | ORAL_TABLET | Freq: Once | ORAL | 1 refills | Status: AC

## 2022-03-31 MED ORDER — PROCHLORPERAZINE MALEATE 10 MG PO TABS: 10.0000 mg | ORAL_TABLET | Freq: Four times a day (QID) | ORAL | 0 refills | Status: DC | PRN

## 2022-03-31 NOTE — ED Notes (Signed)
Discharge instructions explained to patient and family at this time. Patient and family state they understand and agree.   

## 2022-03-31 NOTE — Discharge Instructions (Signed)
Please seek medical attention for any high fevers, chest pain, shortness of breath, change in behavior, persistent vomiting, bloody stool or any other new or concerning symptoms.  

## 2022-04-07 ENCOUNTER — Emergency Department
Admission: EM | Admit: 2022-04-07 | Discharge: 2022-04-08 | Disposition: A | Discharge status: Home / Self Care | Payer: Medicaid Other | Attending: Emergency Medicine | Admitting: Emergency Medicine

## 2022-04-07 ENCOUNTER — Encounter: Payer: Self-pay | Admitting: Emergency Medicine

## 2022-04-07 ENCOUNTER — Ambulatory Visit
Admission: EM | Admit: 2022-04-07 | Discharge: 2022-04-07 | Disposition: A | Discharge status: Home / Self Care | Payer: Medicaid Other | Attending: Emergency Medicine | Admitting: Emergency Medicine

## 2022-04-07 ENCOUNTER — Emergency Department: Payer: Medicaid Other

## 2022-04-07 ENCOUNTER — Other Ambulatory Visit: Payer: Self-pay

## 2022-04-07 DIAGNOSIS — R0789 Other chest pain: Secondary | ICD-10-CM | POA: Insufficient documentation

## 2022-04-07 DIAGNOSIS — K0889 Other specified disorders of teeth and supporting structures: Secondary | ICD-10-CM

## 2022-04-07 DIAGNOSIS — D72829 Elevated white blood cell count, unspecified: Secondary | ICD-10-CM | POA: Insufficient documentation

## 2022-04-07 DIAGNOSIS — R112 Nausea with vomiting, unspecified: Secondary | ICD-10-CM | POA: Diagnosis not present

## 2022-04-07 DIAGNOSIS — K029 Dental caries, unspecified: Secondary | ICD-10-CM | POA: Diagnosis not present

## 2022-04-07 LAB — HEPATIC FUNCTION PANEL
ALT: 21 U/L (ref 0–44)
AST: 22 U/L (ref 15–41)
Albumin: 4 g/dL (ref 3.5–5.0)
Alkaline Phosphatase: 86 U/L (ref 38–126)
Bilirubin, Direct: 0.2 mg/dL (ref 0.0–0.2)
Indirect Bilirubin: 0.8 mg/dL (ref 0.3–0.9)
Total Bilirubin: 1 mg/dL (ref 0.3–1.2)
Total Protein: 6.9 g/dL (ref 6.5–8.1)

## 2022-04-07 LAB — BASIC METABOLIC PANEL
Anion gap: 9 (ref 5–15)
BUN: 12 mg/dL (ref 6–20)
CO2: 23 mmol/L (ref 22–32)
Calcium: 8.9 mg/dL (ref 8.9–10.3)
Chloride: 103 mmol/L (ref 98–111)
Creatinine, Ser: 0.73 mg/dL (ref 0.44–1.00)
GFR, Estimated: >60 mL/min (ref >60)
Glucose, Bld: 112 mg/dL — ABNORMAL HIGH (ref 70–99)
Potassium: 3.3 mmol/L — ABNORMAL LOW (ref 3.5–5.1)
Sodium: 135 mmol/L (ref 135–145)

## 2022-04-07 LAB — TROPONIN I (HIGH SENSITIVITY): Troponin I (High Sensitivity): <2 ng/L (ref <18)

## 2022-04-07 LAB — CBC
HCT: 45 % (ref 36.0–46.0)
Hemoglobin: 15.1 g/dL — ABNORMAL HIGH (ref 12.0–15.0)
MCH: 29.5 pg (ref 26.0–34.0)
MCHC: 33.6 g/dL (ref 30.0–36.0)
MCV: 88.1 fL (ref 80.0–100.0)
Platelets: 336 10*3/uL (ref 150–400)
RBC: 5.11 MIL/uL (ref 3.87–5.11)
RDW: 12.7 % (ref 11.5–15.5)
WBC: 11.2 10*3/uL — ABNORMAL HIGH (ref 4.0–10.5)
nRBC: 0 % (ref 0.0–0.2)

## 2022-04-07 LAB — LIPASE, BLOOD: Lipase: 28 U/L (ref 11–51)

## 2022-04-07 MED ORDER — ALUM & MAG HYDROXIDE-SIMETH 200-200-20 MG/5ML PO SUSP
15.0000 mL | Freq: Once | ORAL | Status: AC
  Administered 2022-04-08: 15 mL via ORAL
  Filled 2022-04-07: qty 30

## 2022-04-07 MED ORDER — FAMOTIDINE 20 MG PO TABS
20.0000 mg | ORAL_TABLET | Freq: Once | ORAL | Status: AC
  Administered 2022-04-08: 20 mg via ORAL
  Filled 2022-04-07: qty 1

## 2022-04-07 MED ORDER — OXYCODONE-ACETAMINOPHEN 5-325 MG PO TABS: 1.0000 | ORAL_TABLET | Freq: Four times a day (QID) | ORAL | 0 refills | Status: DC | PRN

## 2022-04-07 MED ORDER — AMOXICILLIN-POT CLAVULANATE 875-125 MG PO TABS: 1.0000 | ORAL_TABLET | Freq: Two times a day (BID) | ORAL | 0 refills | Status: AC

## 2022-04-07 MED ORDER — ONDANSETRON 4 MG PO TBDP: 4.0000 mg | ORAL_TABLET | Freq: Four times a day (QID) | ORAL | 0 refills | Status: DC | PRN

## 2022-04-07 NOTE — ED Provider Notes (Signed)
[facility]    CSN: [id number] Arrival date & time: 04/07/22  1557      History   Chief Complaint Chief Complaint  Patient presents with   Dental Pain    HPI Courtney Sanchez is a 32 y.o. female.   HPI  32 year old female here for evaluation of dental pain.  The patient reports that she has been experiencing pain in her upper third molar for the last 3 weeks.  She is experiencing pressure in her jaw and is starting to give her a headache.  She is also began to feel nauseous from it.  She denies any fever.  She has an appointment for tomorrow morning at the [facility] of dentistry for an evaluation.  She had been on Augmentin, despite having an allergy to amoxicillin, but had not taken it as prescribed.  Past Medical History:  Diagnosis Date   Asthma    Endometriosis    Endometriosis    Hidradenitis    Hydronephrosis    IBS (irritable bowel syndrome)    Migraine    Migraine    Pseudotumor cerebri    Sciatica    Scoliosis     Patient Active Problem List   Diagnosis Date Noted   Symptomatic cholelithiasis    Tobacco use affecting pregnancy in third trimester, antepartum 10/03/2016   Abdominal pain 09/18/2016   Labor and delivery indication for care or intervention 08/30/2016   Pregnancy 07/03/2016   Abdominal pain affecting pregnancy 78/29/5621   Obesity complicating pregnancy, third trimester 06/11/2016   Supervision of high risk pregnancy, antepartum, third trimester 05/28/2016   History of spinal fusion 04/29/2016   BMI 40.0-44.9, adult ([place]) 04/29/2016   Acute left-sided low back pain with left-sided sciatica 03/27/2015   Idiopathic scoliosis 04/20/2012   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   Renal cyst 12/16/2011   Pseudotumor cerebri 01/16/2011   Hydronephrosis of right kidney 10/23/2010   Intermittent asthma 10/23/2010   Migraines 10/23/2010   IBS (irritable bowel syndrome) 10/23/2010    Past Surgical History:  Procedure Laterality  Date   COLONOSCOPY  2001;2008   KIDNEY SURGERY Right 2012   per pt peice of kidney removed hand has only partial function   LAPAROSCOPY  04/2007   SPINAL FUSION     SPINAL FUSION  2008   SPINAL FUSION     TONSILLECTOMY      OB History     Gravida  1   Para  1   Term  1   Preterm      AB      Living  1      SAB      IAB      Ectopic      Multiple      Live Births  1            Home Medications    Prior to Admission medications   Medication Sig Start Date End Date Taking? Authorizing Provider  amoxicillin-clavulanate (AUGMENTIN) 875-125 MG tablet Take 1 tablet by mouth every 12 (twelve) hours for 10 days. 04/07/22 04/17/22 Yes Margarette Canada, NP  oxyCODONE-acetaminophen (PERCOCET/ROXICET) 5-325 MG tablet Take 1 tablet by mouth every 6 (six) hours as needed for severe pain. 04/07/22  Yes Margarette Canada, NP  albuterol (PROVENTIL HFA;VENTOLIN HFA) 108 (90 Base) MCG/ACT inhaler Inhale 2 puffs into the lungs every 4 (four) hours as needed for wheezing or shortness of breath. 04/05/17   Cuthriell, Charline Bills, PA-C  buPROPion (WELLBUTRIN XL) 150  MG 24 hr tablet  02/20/18   [provider]  FLUoxetine (PROZAC) 20 MG capsule  02/23/18   [provider]  HUMIRA PEN 40 MG/0.4ML PNKT  02/27/17   [provider]  medroxyPROGESTERone (DEPO-PROVERA) 150 MG/ML injection Inject 1 mL (150 mg total) into the muscle every 3 (three) months. 06/18/21 09/16/21  Mirna Mires, CNM  ondansetron (ZOFRAN-ODT) 4 MG disintegrating tablet Take 1 tablet (4 mg total) by mouth every 6 (six) hours as needed for nausea or vomiting. 04/07/22   Becky Augusta, NP  prochlorperazine (COMPAZINE) 10 MG tablet Take 1 tablet (10 mg total) by mouth every 6 (six) hours as needed for nausea or vomiting. 03/31/22   Phineas Semen, MD  SUMAtriptan (IMITREX) 25 MG tablet  01/04/18   [provider]  topiramate (TOPAMAX) 25 MG tablet Take by mouth.    [provider]    Family  History Family History  Problem Relation Age of Onset   Bipolar disorder Mother    Depression Mother    Breast cancer Mother 23   Cancer Father        Kidney   Depression Maternal Grandmother    Breast cancer Maternal Grandmother 61       contact/    Social History Social History   Tobacco Use   Smoking status: Former    Packs/day: 0.50    Types: Cigarettes   Smokeless tobacco: Never  Vaping Use   Vaping Use: Every day  Substance Use Topics   Alcohol use: No   Drug use: No     Allergies   Amoxicillin, Cefaclor, Cefadroxil, Cefuroxime, Cephalexin, Clarithromycin, Clindamycin, Doxycycline, Erythromycin, Penicillins, Sulfamethoxazole-trimethoprim, Vitamin a, Dextromethorphan hbr, Morphine, Other, Azithromycin, and Red dye   Review of Systems Review of Systems  Constitutional:  Negative for fever.  HENT:  Positive for dental problem.   Gastrointestinal:  Positive for nausea.     Physical Exam Triage Vital Signs ED Triage Vitals  Enc Vitals Group     BP 04/07/22 1638 (!) 135/90     Pulse Rate 04/07/22 1638 83     Resp 04/07/22 1638 16     Temp 04/07/22 1638 98.2 F (36.8 C)     Temp Source 04/07/22 1638 Oral     SpO2 04/07/22 1638 100 %     Weight --      Height --      Head Circumference --      Peak Flow --      Pain Score 04/07/22 1636 8     Pain Loc --      Pain Edu? --      Excl. in [place]? --    No data found.  Updated Vital Signs BP (!) 135/90 (BP Location: Left Arm)   Pulse 83   Temp 98.2 F (36.8 C) (Oral)   Resp 16   LMP 03/23/2022   SpO2 100%   Visual Acuity Right Eye Distance:   Left Eye Distance:   Bilateral Distance:    Right Eye Near:   Left Eye Near:    Bilateral Near:     Physical Exam Vitals and nursing note reviewed.  Constitutional:      Appearance: Normal appearance. She is not ill-appearing.  HENT:     Mouth/Throat:     Mouth: Mucous membranes are moist.     Pharynx: Oropharyngeal exudate present. No posterior  oropharyngeal erythema.  Skin:    General: Skin is warm and dry.  Capillary Refill: Capillary refill takes less than 2 seconds.  Neurological:     General: No focal deficit present.     Mental Status: She is alert and oriented to person, place, and time.      UC Treatments / Results  Labs (all labs ordered are listed, but only abnormal results are displayed) Labs Reviewed - No data to display  EKG   Radiology No results found.  Procedures Procedures (including critical care time)  Medications Ordered in UC Medications - No data to display  Initial Impression / Assessment and Plan / UC Course  I have reviewed the triage vital signs and the nursing notes.  Pertinent labs & imaging results that were available during my care of the patient were reviewed by me and considered in my medical decision making (see chart for details).   Patient is a pleasant, nontoxic-appearing 32 year old female here for evaluation of 3 weeks worth of dental pain in her left upper third molar.  She reports that she has been advised needs a root canal.  She was previously evaluated and started on Augmentin.  She states she can take Augmentin without difficulty even though she has an allergy to amoxicillin in her chart that says she has a rash.  She is also taking over-the-counter Tylenol and ibuprofen without significant improvement of her pain.  On exam patient has no tenderness to percussion of any of the molars on the upper left.  She has no facial swelling.  The gum tissue is free of erythema or discharge.  I will treat the patient for an apical abscess with Augmentin 875 twice daily for 10 days.  I have also advised her to keep her appointment with the [facility] of dental tomorrow.  I will refill her Zofran 4 mg ODT's that she can have every 6-8 hours as needed for nausea and vomiting.  I will give her 6 tablets of Percocet that she can use as needed for severe pain.  Checking PDMP shows she has not  received any narcotic scripts since 03/20/2022.   Final Clinical Impressions(s) / UC Diagnoses   Final diagnoses:  Pain, dental     Discharge Instructions      Take the Augmentin twice daily with food for 10 days for treatment of your dental infection.  Use over-the-counter Tylenol and ibuprofen for swelling and mild to moderate pain.  Use the Percocet as needed for severe pain. Do not drink alcohol or drive if you take this medication.  Rinse with warm salt water, or Listerine, after each meal to remove food particles and wash away any pus that is collecting.  Use the Zofran as needed for nausea.  If you develop any increasing or swelling, fever, pain, or difficulty swallowing you to go to the emergency department at [facility] with a have an oral surgeon and also a dentist on-call.   Keep your dental appointment as scheduled for tomorrow.     ED Prescriptions     Medication Sig Dispense Auth. Provider   amoxicillin-clavulanate (AUGMENTIN) 875-125 MG tablet Take 1 tablet by mouth every 12 (twelve) hours for 10 days. 20 tablet Margarette Canada, NP   ondansetron (ZOFRAN-ODT) 4 MG disintegrating tablet Take 1 tablet (4 mg total) by mouth every 6 (six) hours as needed for nausea or vomiting. 20 tablet Margarette Canada, NP   oxyCODONE-acetaminophen (PERCOCET/ROXICET) 5-325 MG tablet Take 1 tablet by mouth every 6 (six) hours as needed for severe pain. 6 tablet Margarette Canada, NP  I have reviewed the PDMP during this encounter.   Margarette Canada, NP 04/07/22 1659

## 2022-04-07 NOTE — Discharge Instructions (Addendum)
Take the Augmentin twice daily with food for 10 days for treatment of your dental infection.  Use over-the-counter Tylenol and ibuprofen for swelling and mild to moderate pain.  Use the Percocet as needed for severe pain. Do not drink alcohol or drive if you take this medication.  Rinse with warm salt water, or Listerine, after each meal to remove food particles and wash away any pus that is collecting.  Use the Zofran as needed for nausea.  If you develop any increasing or swelling, fever, pain, or difficulty swallowing you to go to the emergency department at [facility] with a have an oral surgeon and also a dentist on-call.   Keep your dental appointment as scheduled for tomorrow.

## 2022-04-07 NOTE — ED Triage Notes (Signed)
Pt presents with left side dental pain and headache x 3 weeks. Pt has a dental appointment scheduled for tomorrow.

## 2022-04-07 NOTE — ED Triage Notes (Signed)
Pt to ED from home c/o left upper dental pain x1 month, took augmentin 2 weeks ago but didn't finish the whole dose, restarted new prescription today but has been vomiting "non stop every hour" since this morning.  Pt also c/o mid chest pain radiating to right chest starting tonight around 1930 and is like pressure.  Pt A&Ox4, chest rise even and unlabored, skin WNL and in NAD at this time.

## 2022-04-07 NOTE — ED Provider Notes (Incomplete)
[facility] Provider Note    Event Date/Time   First MD Initiated Contact with Patient 04/07/22 2341     (approximate)   History   Chest Pain and Dental Pain   HPI  Courtney Sanchez is a 32 y.o. female seen and evaluated today for concerns of ongoing dental pain at urgent care.  Was prescribed medications at urgent care.  Patient reports that she has had some nausea and occasional vomiting off and on for about 3 days.  She is also been having several days about 2 to 3 weeks of pain in her left upper jaw.  She has had area there is a painful tooth.  She was started on Augmentin but did not take it because of the death of a family member, and today went to urgent care and was prescribed Augmentin which she is started took 1 dose today as well as oxycodone but she has not yet taken that.    Reviewed the patient's urgent care note from today     Physical Exam   Triage Vital Signs: ED Triage Vitals  Enc Vitals Group     BP 04/07/22 2221 (!) 149/87     Pulse Rate 04/07/22 2221 85     Resp 04/07/22 2221 18     Temp 04/07/22 2221 98.4 F (36.9 C)     Temp Source 04/07/22 2221 Oral     SpO2 04/07/22 2221 98 %     Weight 04/07/22 2219 250 lb (113.4 kg)     Height 04/07/22 2219 5\' 5"  (1.651 m)     Head Circumference --      Peak Flow --      Pain Score 04/07/22 2219 7     Pain Loc --      Pain Edu? --      Excl. in [place]? --     Most recent vital signs: Vitals:   04/07/22 2221  BP: (!) 149/87  Pulse: 85  Resp: 18  Temp: 98.4 F (36.9 C)  SpO2: 98%     General: Awake, no distress.  Pleasant.  Appears slightly uncomfortable reporting pain in her left upper mouth. CV:  Good peripheral perfusion. *** Resp:  Normal effort. *** Abd:  No distention. *** Other:  ***   ED Results / Procedures / Treatments   Labs (all labs ordered are listed, but only abnormal results are displayed) Labs Reviewed  BASIC METABOLIC PANEL - Abnormal; Notable  for the following components:      Result Value   Potassium 3.3 (*)    Glucose, Bld 112 (*)    All other components within normal limits  CBC - Abnormal; Notable for the following components:   WBC 11.2 (*)    Hemoglobin 15.1 (*)    All other components within normal limits  HEPATIC FUNCTION PANEL  LIPASE, BLOOD  POC URINE PREG, ED  TROPONIN I (HIGH SENSITIVITY)     EKG  And interpreted by me at 2220 heart rate 90 QRS 90 QTc 450 Normal sinus rhythm no evidence of acute ischemia or ectopy.   RADIOLOGY chest x-ray interpreted by me as negative for acute  Perc ***   PROCEDURES:  Critical Care performed: {CriticalCareYesNo:19197::"Yes, see critical care procedure note(s)","No"}  Procedures   MEDICATIONS ORDERED IN ED: Medications - No data to display   IMPRESSION / MDM / [place] / ED COURSE  I reviewed the triage vital signs and the nursing notes.  Differential diagnosis includes, but is not limited to, ***  Patient's initial cardiac troponin is normal.  Labs reveal mild leukocytosis white count 11.2.  This is also however noted in the setting of a suspected dental infection.  Patient's presentation is most consistent with {EM COPA:27473}  *** {If the patient is on the monitor, remove the brackets and asterisks on the sentence below and remember to document it as a Procedure as well. Otherwise delete the sentence below:1} {**The patient is on the cardiac monitor to evaluate for evidence of arrhythmia and/or significant heart rate changes.**} {Remember to include, when applicable, any/all of the following data: independent review of imaging independent review of labs (comment specifically on pertinent positives and negatives) review of specific prior hospitalizations, PCP/specialist notes, etc. discuss meds given and prescribed document any discussion with consultants (including hospitalists) any clinical decision tools  you used and why (PECARN, NEXUS, etc.) did you consider admitting the patient? document social determinants of health affecting patient's care (homelessness, inability to follow up in a timely fashion, etc) document any pre-existing conditions increasing risk on current visit (e.g. diabetes and HTN increasing danger of high-risk chest pain/ACS) describes what meds you gave (especially parenteral) and why any other interventions?:1}     FINAL CLINICAL IMPRESSION(S) / ED DIAGNOSES   Final diagnoses:  None     Rx / DC Orders   ED Discharge Orders     None        Note:  This document was prepared using Dragon voice recognition software and may include unintentional dictation errors.

## 2022-04-07 NOTE — ED Provider Notes (Signed)
[facility] Provider Note    Event Date/Time   First MD Initiated Contact with Patient 04/07/22 2341     (approximate)   History   Chest Pain and Dental Pain   HPI  Courtney Sanchez is a 32 y.o. female seen and evaluated today for concerns of ongoing dental pain at urgent care.  Was prescribed medications at urgent care.  Patient reports that she has had some nausea and occasional vomiting off and on for about 3 days.  She is also been having several days about 2 to 3 weeks of pain in her left upper jaw.  She has had area there is a painful tooth.  She was started on Augmentin but did not take it because of the death of a family member, and today went to urgent care and was prescribed Augmentin which she is started took 1 dose today as well as oxycodone but she has not yet taken that.  Reports after the vomiting she has had a burning sensation in her chest burning in the middle of her chest, at times it sort of feels like it moves towards the right side.  No shortness of breath.  Reviewed the patient's urgent care note from today     Physical Exam   Triage Vital Signs: ED Triage Vitals  Enc Vitals Group     BP 04/07/22 2221 (!) 149/87     Pulse Rate 04/07/22 2221 85     Resp 04/07/22 2221 18     Temp 04/07/22 2221 98.4 F (36.9 C)     Temp Source 04/07/22 2221 Oral     SpO2 04/07/22 2221 98 %     Weight 04/07/22 2219 250 lb (113.4 kg)     Height 04/07/22 2219 5\' 5"  (1.651 m)     Head Circumference --      Peak Flow --      Pain Score 04/07/22 2219 7     Pain Loc --      Pain Edu? --      Excl. in [place]? --     Most recent vital signs: Vitals:   04/07/22 2221  BP: (!) 149/87  Pulse: 85  Resp: 18  Temp: 98.4 F (36.9 C)  SpO2: 98%     General: Awake, no distress.  Pleasant.  Appears slightly uncomfortable reporting pain in her left upper mouth.  Normocephalic atraumatic.  There is no swelling appreciated around the face or neck or  lower jaw.  Breathing easily.  Swallowing secretions well.  Currently drinking sweet tea, reports that nausea comes and goes and presently she has been able to eat and continued to take sweet tea Moist mucous membranes fully alert sits up without difficulty CV:  Good peripheral perfusion.  Normal rate and tones Resp:  Normal effort.  Abd:  No distention.  Soft nontender nondistended Other:    Denies pregnancy.  Advises celibacy for at least 2 years  ED Results / Procedures / Treatments   Labs (all labs ordered are listed, but only abnormal results are displayed) Labs Reviewed  BASIC METABOLIC PANEL - Abnormal; Notable for the following components:      Result Value   Potassium 3.3 (*)    Glucose, Bld 112 (*)    All other components within normal limits  CBC - Abnormal; Notable for the following components:   WBC 11.2 (*)    Hemoglobin 15.1 (*)    All other components within normal limits  HEPATIC  FUNCTION PANEL  LIPASE, BLOOD  POC URINE PREG, ED  TROPONIN I (HIGH SENSITIVITY)  TROPONIN I (HIGH SENSITIVITY)     EKG  And interpreted by me at 2220 heart rate 90 QRS 90 QTc 450 Normal sinus rhythm no evidence of acute ischemia or ectopy.   RADIOLOGY chest x-ray interpreted by me as negative for acute  Chest pain atypical.  Reports it is a burning discomfort.  No hypoxia no tachycardia no clinical signs or symptoms suggestive of her high risk for ACS or PE   PROCEDURES:  Critical Care performed: No  Procedures   MEDICATIONS ORDERED IN ED: Medications  famotidine (PEPCID) tablet 20 mg (20 mg Oral Given 04/08/22 0005)  alum & mag hydroxide-simeth (MAALOX/MYLANTA) 200-200-20 MG/5ML suspension 15 mL (15 mLs Oral Given 04/08/22 0005)     IMPRESSION / MDM / ASSESSMENT AND PLAN / ED COURSE  I reviewed the triage vital signs and the nursing notes.                              Differential diagnosis includes, but is not limited to, probable dental caries or possible dental  abscess.  She does have an area around tooth approximately 13 or 14 that is fairly tender with slight erythema of the gum tissue but no necrosis no significant swelling or abscess.  No fistula tract.  The oropharynx is widely patent.  The anterior neck is nontender nonswollen the tongue is normal.  Floor the mouth normal.  She has a dental appointment today at 10 AM with [facility] which I encouraged her to keep.  Her EKG very reassuring, atypical symptoms with a normal troponin and she associates a burning symptom along with nausea and vomiting that has been alleviated by use of Zofran and Compazine at home.  At this point I reassured her, I think the best course of action would be for her to follow-up with [facility] dental and have her tooth addressed which I think may be a heart of her issue causing pain and possibly nausea.  She states reports nausea has been present prior to starting amoxicillin.  I discussed with her she has an allergy listed as amoxicillin allergy in her chart, but she reports that she has "outgrown it" over time  Patient's initial cardiac troponin is normal.  Labs reveal mild leukocytosis white count 11.2.  This is also however noted in the setting of a suspected dental infection.  Patient's presentation is most consistent with acute complicated illness / injury requiring diagnostic workup.  Return precautions and treatment recommendations and follow-up discussed with the patient who is agreeable with the plan.   Nausea now controlled, atypical chest pain normal troponin, reassuring workup and labs.  Resting comfortably.  Discussed with patient and she is agreeable with plan to follow-up with [facility] dental, return precautions regarding her symptoms and presentation advised  Return precautions and treatment recommendations and follow-up discussed with the patient who is agreeable with the plan.         FINAL CLINICAL IMPRESSION(S) / ED DIAGNOSES   Final diagnoses:  Pain due to  dental caries  Nausea and vomiting, unspecified vomiting type  Atypical chest pain     Rx / DC Orders   ED Discharge Orders     None        Note:  This document was prepared using Dragon voice recognition software and may include unintentional dictation errors.   Delman Kitten, MD  04/08/22 0009  

## 2022-04-25 ENCOUNTER — Telehealth: Payer: Self-pay

## 2022-04-25 NOTE — Telephone Encounter (Signed)
Pt called reporting she was changing her tampon every 2 hours and needs her depo. Pt advised that if she is filling a pad full and changing every hour she should go to the ER. Pt wanted her depo refilled. She was advised she needed to be seen first.Pt has not been seen since 2021. Clarise Cruz called pt back to schedule annual.

## 2022-04-25 NOTE — Telephone Encounter (Signed)
I contacted patient via phone. The patient requested to see any provider for first available appointment for annual. The patient is scheduled for 05/08/22 with [facility]

## 2022-05-04 ENCOUNTER — Ambulatory Visit
Admission: EM | Admit: 2022-05-04 | Discharge: 2022-05-04 | Disposition: A | Discharge status: Home / Self Care | Payer: Medicaid Other | Attending: Physician Assistant | Admitting: Physician Assistant

## 2022-05-04 ENCOUNTER — Encounter: Payer: Self-pay | Admitting: Emergency Medicine

## 2022-05-04 DIAGNOSIS — R051 Acute cough: Secondary | ICD-10-CM | POA: Diagnosis present

## 2022-05-04 DIAGNOSIS — J029 Acute pharyngitis, unspecified: Secondary | ICD-10-CM | POA: Diagnosis present

## 2022-05-04 DIAGNOSIS — J069 Acute upper respiratory infection, unspecified: Secondary | ICD-10-CM | POA: Diagnosis present

## 2022-05-04 DIAGNOSIS — Z1152 Encounter for screening for COVID-19: Secondary | ICD-10-CM | POA: Diagnosis not present

## 2022-05-04 LAB — GROUP A STREP BY PCR: Group A Strep by PCR: NOT DETECTED

## 2022-05-04 LAB — SARS CORONAVIRUS 2 BY RT PCR: SARS Coronavirus 2 by RT PCR: NEGATIVE

## 2022-05-04 MED ORDER — CHERATUSSIN AC 100-10 MG/5ML PO SOLN: 10.0000 mL | Freq: Three times a day (TID) | ORAL | 0 refills | Status: DC | PRN

## 2022-05-04 NOTE — Discharge Instructions (Addendum)
URI/COLD SYMPTOMS:  Negative strep and COVID. Your exam today is consistent with a viral illness. Antibiotics are not indicated at this time. Use medications as directed, including cough syrup, nasal saline, and decongestants. Your symptoms should improve over the next few days and resolve within 7-10 days. Increase rest and fluids. F/u if symptoms worsen or predominate such as sore throat, ear pain, productive cough, shortness of breath, or if you develop high fevers or worsening fatigue over the next several days.

## 2022-05-04 NOTE — ED Provider Notes (Signed)
[facility]    CSN: [id number] Arrival date & time: 05/04/22  1529      History   Chief Complaint Chief Complaint  Patient presents with   Sore Throat   Cough    HPI Courtney Sanchez is a 32 y.o. female presenting with daughter for 4 day history of cough, congestion, fatigue, headaches, sinus pressure and sore throat.  Patient denies fever, ear pain, breathing difficulty or vomiting.  Has had some diarrhea.  She was seen 2 days ago at a different urgent care and tested for strep, COVID and flu and everything was negative.  She reports taking multiple over-the-counter decongestants and using throat lozenges without relief.  She says that she feels her symptoms are getting worse.  Her daughter is sick with similar symptoms.  No known flu, COVID or strep exposure.  COVID-19 infection 1 month ago for patient.  No other complaints.  HPI  Past Medical History:  Diagnosis Date   Asthma    Endometriosis    Endometriosis    Hidradenitis    Hydronephrosis    IBS (irritable bowel syndrome)    Migraine    Migraine    Pseudotumor cerebri    Sciatica    Scoliosis     Patient Active Problem List   Diagnosis Date Noted   Symptomatic cholelithiasis    Tobacco use affecting pregnancy in third trimester, antepartum 10/03/2016   Abdominal pain 09/18/2016   Labor and delivery indication for care or intervention 08/30/2016   Pregnancy 07/03/2016   Abdominal pain affecting pregnancy [id number]   Obesity complicating pregnancy, third trimester 06/11/2016   Supervision of high risk pregnancy, antepartum, third trimester 05/28/2016   History of spinal fusion 04/29/2016   BMI 40.0-44.9, adult ([place]) 04/29/2016   Acute left-sided low back pain with left-sided sciatica 03/27/2015   Idiopathic scoliosis 04/20/2012   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   Renal cyst 12/16/2011   Pseudotumor cerebri 01/16/2011   Hydronephrosis of right kidney 10/23/2010   Intermittent asthma  10/23/2010   Migraines 10/23/2010   IBS (irritable bowel syndrome) 10/23/2010    Past Surgical History:  Procedure Laterality Date   CHOLECYSTECTOMY     COLONOSCOPY  2001;2008   KIDNEY SURGERY Right 2012   per pt peice of kidney removed hand has only partial function   LAPAROSCOPY  04/2007   SPINAL FUSION     SPINAL FUSION  2008   SPINAL FUSION     TONSILLECTOMY      OB History     Gravida  1   Para  1   Term  1   Preterm      AB      Living  1      SAB      IAB      Ectopic      Multiple      Live Births  1            Home Medications    Prior to Admission medications   Medication Sig Start Date End Date Taking? Authorizing Provider  guaiFENesin-codeine (CHERATUSSIN AC) 100-10 MG/5ML syrup Take 10 mLs by mouth 3 (three) times daily as needed. 05/04/22  Yes Laurene Footman B, PA-C  albuterol (PROVENTIL HFA;VENTOLIN HFA) 108 (90 Base) MCG/ACT inhaler Inhale 2 puffs into the lungs every 4 (four) hours as needed for wheezing or shortness of breath. 04/05/17   Cuthriell, Charline Bills, PA-C  buPROPion (WELLBUTRIN XL) 150 MG 24 hr tablet  02/20/18  [provider]  FLUoxetine (PROZAC) 20 MG capsule  02/23/18   [provider]  HUMIRA PEN 40 MG/0.4ML PNKT  02/27/17   [provider]  medroxyPROGESTERone (DEPO-PROVERA) 150 MG/ML injection Inject 1 mL (150 mg total) into the muscle every 3 (three) months. 06/18/21 09/16/21  Imagene Riches, CNM  ondansetron (ZOFRAN-ODT) 4 MG disintegrating tablet Take 1 tablet (4 mg total) by mouth every 6 (six) hours as needed for nausea or vomiting. 04/07/22   Margarette Canada, NP  oxyCODONE-acetaminophen (PERCOCET/ROXICET) 5-325 MG tablet Take 1 tablet by mouth every 6 (six) hours as needed for severe pain. 04/07/22   Margarette Canada, NP  prochlorperazine (COMPAZINE) 10 MG tablet Take 1 tablet (10 mg total) by mouth every 6 (six) hours as needed for nausea or vomiting. 03/31/22   Nance Pear, MD  SUMAtriptan  (IMITREX) 25 MG tablet  01/04/18   [provider]  topiramate (TOPAMAX) 25 MG tablet Take by mouth.    [provider]    Family History Family History  Problem Relation Age of Onset   Bipolar disorder Mother    Depression Mother    Breast cancer Mother 20   Cancer Father        Kidney   Depression Maternal Grandmother    Breast cancer Maternal Grandmother 60       contact/    Social History Social History   Tobacco Use   Smoking status: Former    Packs/day: 0.50    Types: Cigarettes   Smokeless tobacco: Never  Vaping Use   Vaping Use: Every day  Substance Use Topics   Alcohol use: No   Drug use: No     Allergies   Cefaclor, Cefadroxil, Cefuroxime, Cephalexin, Clarithromycin, Clindamycin, Erythromycin, Sulfamethoxazole-trimethoprim, Vitamin a, Morphine, Other, Azithromycin, and Red dye   Review of Systems Review of Systems  Constitutional:  Positive for fatigue. Negative for chills, diaphoresis and fever.  HENT:  Positive for congestion, rhinorrhea, sinus pressure and sore throat. Negative for ear pain and sinus pain.   Respiratory:  Positive for cough. Negative for shortness of breath.   Gastrointestinal:  Positive for diarrhea. Negative for abdominal pain, nausea and vomiting.  Musculoskeletal:  Negative for myalgias.  Skin:  Negative for rash.  Neurological:  Positive for headaches. Negative for weakness.  Hematological:  Negative for adenopathy.     Physical Exam Triage Vital Signs ED Triage Vitals  Enc Vitals Group     BP      Pulse      Resp      Temp      Temp src      SpO2      Weight      Height      Head Circumference      Peak Flow      Pain Score      Pain Loc      Pain Edu?      Excl. in [place]?    No data found.  Updated Vital Signs BP (!) 137/100 (BP Location: Left Arm)   Pulse 92   Temp 99.1 F (37.3 C) (Oral)   Resp 14   Ht '5\' 5"'$  (1.651 m)   Wt 250 lb (113.4 kg)   LMP 04/28/2022 (Approximate)   SpO2 98%    BMI 41.60 kg/m      Physical Exam Vitals and nursing note reviewed.  Constitutional:      General: She is not in acute distress.  Appearance: Normal appearance. She is ill-appearing. She is not toxic-appearing.  HENT:     Head: Normocephalic and atraumatic.     Nose: Congestion present.     Mouth/Throat:     Mouth: Mucous membranes are moist.     Pharynx: Oropharynx is clear. Posterior oropharyngeal erythema present.  Eyes:     General: No scleral icterus.       Right eye: No discharge.        Left eye: No discharge.     Conjunctiva/sclera: Conjunctivae normal.  Cardiovascular:     Rate and Rhythm: Normal rate and regular rhythm.     Heart sounds: Normal heart sounds.  Pulmonary:     Effort: Pulmonary effort is normal. No respiratory distress.     Breath sounds: Normal breath sounds. No wheezing, rhonchi or rales.  Musculoskeletal:     Cervical back: Neck supple.  Skin:    General: Skin is dry.  Neurological:     General: No focal deficit present.     Mental Status: She is alert. Mental status is at baseline.     Motor: No weakness.     Gait: Gait normal.  Psychiatric:        Mood and Affect: Mood normal.        Behavior: Behavior normal.        Thought Content: Thought content normal.      UC Treatments / Results  Labs (all labs ordered are listed, but only abnormal results are displayed) Labs Reviewed  GROUP A STREP BY PCR  SARS CORONAVIRUS 2 BY RT PCR    EKG   Radiology No results found.  Procedures Procedures (including critical care time)  Medications Ordered in UC Medications - No data to display  Initial Impression / Assessment and Plan / UC Course  I have reviewed the triage vital signs and the nursing notes.  Pertinent labs & imaging results that were available during my care of the patient were reviewed by me and considered in my medical decision making (see chart for details).   32 year old female presents for 4-day history of cough,  ingestion, fatigue, headaches, sore throat and diarrhea.  Daughter has similar symptoms.  Patient tested negative for flu, COVID and strep at a different urgent care 2 days ago.  She is afebrile.  Mildly ill-appearing but nontoxic.  On exam is nasal congestion and erythema posterior pharynx.  Chest clear to auscultation heart regular rate and rhythm.  Strep and COVID testing obtained. All negative.   Reviewed all results with patient.  Viral illness. Reviewed supportive care.  Increase rest and fluids.  Continue ibuprofen and Tylenol as needed for body aches. Sent Cheratussin. Patient says she has taken this and it has been helpful. Reviewed return precautions.   Final Clinical Impressions(s) / UC Diagnoses   Final diagnoses:  Viral upper respiratory tract infection  Sore throat  Acute cough     Discharge Instructions      URI/COLD SYMPTOMS:  Negative strep and COVID. Your exam today is consistent with a viral illness. Antibiotics are not indicated at this time. Use medications as directed, including cough syrup, nasal saline, and decongestants. Your symptoms should improve over the next few days and resolve within 7-10 days. Increase rest and fluids. F/u if symptoms worsen or predominate such as sore throat, ear pain, productive cough, shortness of breath, or if you develop high fevers or worsening fatigue over the next several days.       ED Prescriptions  Medication Sig Dispense Auth. Provider   guaiFENesin-codeine (CHERATUSSIN AC) 100-10 MG/5ML syrup Take 10 mLs by mouth 3 (three) times daily as needed. 120 mL Danton Clap, PA-C      I have reviewed the PDMP during this encounter.   Danton Clap, PA-C 05/04/22 [phone]

## 2022-05-04 NOTE — ED Triage Notes (Signed)
Patient c/o cough, congestion and sore throat that started 4 days ago.  Patient was seen at [facility] on 05/02/22 and was treated for URI symptoms.  Patient had negative flu test on 3/1.

## 2022-05-07 NOTE — Progress Notes (Signed)
PCP:  Dione Housekeeper, MD   Chief Complaint  Patient presents with   Gynecologic Exam   Contraception    Start depo again     HPI:      Courtney Sanchez is a 32 y.o. G1P1001 whose LMP was Patient's last menstrual period was 04/28/2022 (approximate)., presents today for her annual examination.  Her menses are regular every 28-30 days, lasting 7 days, heavy flow, with 1/2 dollar sized clots, no BTB.  Dysmenorrhea mod to severe, not improved with NSAIDs. No non-menstrual pelvic pain. Did depo in past with amenorrhea; stopped it about 2 yrs ago, wants to restart it. Hx of migraine with aura/endometriosis.   Sex activity: not sexually active.  Last Pap: 10/20/19 Results were: ASCUS with NEGATIVE high risk HPV   There is a FH of breast cancer in her mother and MGM, genetic testing not indicated for pt due to ages, but will cont to follow FH/guidelines. There is no FH of ovarian cancer. The patient does not do self-breast exams.  Tobacco use: vapes daily Alcohol use: none Daily THC vaping for anxiety. Pt on effexor, wellbutrin, and klonopin with PCP Exercise: not active  She does not get adequate calcium and Vitamin D in her diet.  Patient Active Problem List   Diagnosis Date Noted   Family history of breast cancer 05/08/2022   Endometriosis 05/08/2022   Menorrhagia with regular cycle 05/08/2022   Symptomatic cholelithiasis    Tobacco use affecting pregnancy in third trimester, antepartum 10/03/2016   Abdominal pain 09/18/2016   Labor and delivery indication for care or intervention 08/30/2016   Pregnancy 07/03/2016   Abdominal pain affecting pregnancy 06/14/2016   Obesity complicating pregnancy, third trimester 06/11/2016   Supervision of high risk pregnancy, antepartum, third trimester 05/28/2016   History of spinal fusion 04/29/2016   BMI 40.0-44.9, adult ([facility]) 04/29/2016   Acute left-sided low back pain with left-sided sciatica 03/27/2015   Idiopathic scoliosis  04/20/2012   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   Renal cyst 12/16/2011   Pseudotumor cerebri 01/16/2011   Hydronephrosis of right kidney 10/23/2010   Intermittent asthma 10/23/2010   Migraines 10/23/2010   IBS (irritable bowel syndrome) 10/23/2010    Past Surgical History:  Procedure Laterality Date   CHOLECYSTECTOMY     COLONOSCOPY  2001;2008   KIDNEY SURGERY Right 2012   per pt peice of kidney removed hand has only partial function   LAPAROSCOPY  04/2007   SPINAL FUSION     SPINAL FUSION  2008   SPINAL FUSION     TONSILLECTOMY      Family History  Problem Relation Age of Onset   Bipolar disorder Mother    Depression Mother    Breast cancer Mother 89   Cancer Father        Kidney   Depression Maternal Grandmother    Breast cancer Maternal Grandmother 90       contact/    Social History   Socioeconomic History   Marital status: Single    Spouse name: Not on file   Number of children: Not on file   Years of education: Not on file   Highest education level: Not on file  Occupational History   Not on file  Tobacco Use   Smoking status: Former    Packs/day: 0.50    Types: Cigarettes   Smokeless tobacco: Never  Vaping Use   Vaping Use: Every day  Substance and Sexual Activity   Alcohol use: No  Drug use: No   Sexual activity: Not Currently    Birth control/protection: None  Other Topics Concern   Not on file  Social History Narrative   Not on file   Social Determinants of Health   Financial Resource Strain: Not on file  Food Insecurity: Not on file  Transportation Needs: Not on file  Physical Activity: Not on file  Stress: Not on file  Social Connections: Not on file  Intimate Partner Violence: Not on file     Current Outpatient Medications:    buPROPion (WELLBUTRIN XL) 150 MG 24 hr tablet, , Disp: , Rfl:    clindamycin (CLEOCIN T) 1 % external solution, SMARTSIG:Sparingly Topical Twice Daily, Disp: , Rfl:    clonazePAM (KLONOPIN) 0.5 MG  tablet, Take by mouth., Disp: , Rfl:    ibuprofen (ADVIL) 800 MG tablet, Take by mouth., Disp: , Rfl:    ondansetron (ZOFRAN-ODT) 4 MG disintegrating tablet, Take 1 tablet (4 mg total) by mouth every 6 (six) hours as needed for nausea or vomiting., Disp: 20 tablet, Rfl: 0   predniSONE (DELTASONE) 50 MG tablet, Take by mouth., Disp: , Rfl:    QUEtiapine Fumarate 150 MG TABS, Take by mouth., Disp: , Rfl:    SUMAtriptan (IMITREX) 25 MG tablet, , Disp: , Rfl:    topiramate (TOPAMAX) 25 MG tablet, Take by mouth., Disp: , Rfl:    triamcinolone cream (KENALOG) 0.1 %, SMARTSIG:Sparingly Topical Twice Daily PRN, Disp: , Rfl:    venlafaxine (EFFEXOR) 100 MG tablet, TAKE 1.5 TABLETS BY MOUTH ONCE DAILY., Disp: , Rfl:    HUMIRA-CD/UC/HS STARTER 80 MG/0.8ML PNKT, Inject into the skin. (Patient not taking: Reported on 05/08/2022), Disp: , Rfl:   Current Facility-Administered Medications:    medroxyPROGESTERone Acetate SUSY 150 mg, 150 mg, Intramuscular, Q90 days, Kim Lauver B, PA-C, 150 mg at 05/08/22 1352     ROS:  Review of Systems  Constitutional:  Positive for fatigue. Negative for fever and unexpected weight change.  Respiratory:  Negative for cough, shortness of breath and wheezing.   Cardiovascular:  Negative for chest pain, palpitations and leg swelling.  Gastrointestinal:  Negative for blood in stool, constipation, diarrhea, nausea and vomiting.  Endocrine: Negative for cold intolerance, heat intolerance and polyuria.  Genitourinary:  Negative for dyspareunia, dysuria, flank pain, frequency, genital sores, hematuria, menstrual problem, pelvic pain, urgency, vaginal bleeding, vaginal discharge and vaginal pain.  Musculoskeletal:  Negative for back pain, joint swelling and myalgias.  Skin:  Negative for rash.  Neurological:  Positive for headaches. Negative for dizziness, syncope, light-headedness and numbness.  Hematological:  Negative for adenopathy.  Psychiatric/Behavioral:  Positive for  agitation and dysphoric mood. Negative for confusion, sleep disturbance and suicidal ideas. The patient is not nervous/anxious.    BREAST: No symptoms   Objective: BP 120/70   Ht 5\' 5"  (1.651 m)   Wt 255 lb (115.7 kg)   LMP 04/28/2022 (Approximate)   BMI 42.43 kg/m    Physical Exam Constitutional:      Appearance: She is well-developed.  Genitourinary:     Vulva normal.     Right Labia: No rash, tenderness or lesions.    Left Labia: No tenderness, lesions or rash.    No vaginal discharge, erythema or tenderness.      Right Adnexa: not tender and no mass present.    Left Adnexa: not tender and no mass present.    No cervical friability or polyp.     Uterus is not enlarged or tender.  Breasts:    Right: No mass, nipple discharge, skin change or tenderness.     Left: No mass, nipple discharge, skin change or tenderness.  Neck:     Thyroid: No thyromegaly.  Cardiovascular:     Rate and Rhythm: Normal rate and regular rhythm.     Heart sounds: Normal heart sounds. No murmur heard. Pulmonary:     Effort: Pulmonary effort is normal.     Breath sounds: Normal breath sounds.  Abdominal:     Palpations: Abdomen is soft.     Tenderness: There is no abdominal tenderness. There is no guarding or rebound.  Musculoskeletal:        General: Normal range of motion.     Cervical back: Normal range of motion.  Lymphadenopathy:     Cervical: No cervical adenopathy.  Neurological:     General: No focal deficit present.     Mental Status: She is alert and oriented to person, place, and time.     Cranial Nerves: No cranial nerve deficit.  Skin:    General: Skin is warm and dry.  Psychiatric:        Mood and Affect: Mood normal.        Behavior: Behavior normal.        Thought Content: Thought content normal.        Judgment: Judgment normal.  Vitals reviewed.     Results: Results for orders placed or performed in visit on 05/08/22 (from the past 24 hour(s))  POCT hemoglobin      Status: None   Collection Time: 05/08/22  1:50 PM  Result Value Ref Range   Hemoglobin 13.1 11 - 14.6 g/dL    Assessment/Plan: Encounter for annual routine gynecological examination  Cervical cancer screening - Plan: Cytology - PAP  Encounter for initial prescription of injectable contraceptive - Plan: medroxyPROGESTERone Acetate SUSY 150 mg; Depo restart today. Increase ca/Vit D.   Menorrhagia with regular cycle - Plan: POCT hemoglobin; WNL HgB. Restarting depo anyway  Endometriosis--will do depo again for sx.   Meds ordered this encounter  Medications   medroxyPROGESTERone Acetate SUSY 150 mg             GYN counsel adequate intake of calcium and vitamin D, diet and exercise--exercise has been shown to help with anxiety. D/C THC use which actually increases anxiety/depression sx.      F/U  Return in about 1 year (around 05/08/2023).  Courtney Sanchez B. Cariana Karge, PA-C 05/08/2022 3:05 PM

## 2022-05-08 ENCOUNTER — Other Ambulatory Visit (HOSPITAL_COMMUNITY)
Admission: RE | Admit: 2022-05-08 | Discharge: 2022-05-08 | Disposition: A | Discharge status: Home / Self Care | Payer: Medicaid Other | Source: Ambulatory Visit | Attending: Obstetrics and Gynecology | Admitting: Obstetrics and Gynecology

## 2022-05-08 ENCOUNTER — Ambulatory Visit (INDEPENDENT_AMBULATORY_CARE_PROVIDER_SITE_OTHER): Payer: Medicaid Other | Admitting: Obstetrics and Gynecology

## 2022-05-08 ENCOUNTER — Encounter: Payer: Self-pay | Admitting: Obstetrics and Gynecology

## 2022-05-08 VITALS — BP 120/70 | Ht 65.0 in | Wt 255.0 lb

## 2022-05-08 DIAGNOSIS — Z30013 Encounter for initial prescription of injectable contraceptive: Secondary | ICD-10-CM

## 2022-05-08 DIAGNOSIS — Z01419 Encounter for gynecological examination (general) (routine) without abnormal findings: Secondary | ICD-10-CM | POA: Diagnosis not present

## 2022-05-08 DIAGNOSIS — Z124 Encounter for screening for malignant neoplasm of cervix: Secondary | ICD-10-CM | POA: Diagnosis present

## 2022-05-08 DIAGNOSIS — Z3042 Encounter for surveillance of injectable contraceptive: Secondary | ICD-10-CM

## 2022-05-08 DIAGNOSIS — N920 Excessive and frequent menstruation with regular cycle: Secondary | ICD-10-CM | POA: Insufficient documentation

## 2022-05-08 DIAGNOSIS — Z803 Family history of malignant neoplasm of breast: Secondary | ICD-10-CM | POA: Insufficient documentation

## 2022-05-08 DIAGNOSIS — N809 Endometriosis, unspecified: Secondary | ICD-10-CM

## 2022-05-08 LAB — POCT HEMOGLOBIN: Hemoglobin: 13.1 g/dL (ref 11–14.6)

## 2022-05-08 MED ORDER — MEDROXYPROGESTERONE ACETATE 150 MG/ML IM SUSY
150.0000 mg | PREFILLED_SYRINGE | INTRAMUSCULAR | Status: AC
  Administered 2022-05-08: 150 mg via INTRAMUSCULAR

## 2022-05-08 NOTE — Patient Instructions (Signed)
I value your feedback and you entrusting us with your care. If you get a [facility] patient survey, I would appreciate you taking the time to let us know about your experience today. Thank you! ? ? ?

## 2022-05-15 LAB — CYTOLOGY - PAP
Comment: NEGATIVE
Diagnosis: UNDETERMINED — AB
High risk HPV: NEGATIVE

## 2022-05-27 ENCOUNTER — Ambulatory Visit
Admission: EM | Admit: 2022-05-27 | Discharge: 2022-05-27 | Disposition: A | Discharge status: Home / Self Care | Payer: Medicaid Other | Attending: Physician Assistant | Admitting: Physician Assistant

## 2022-05-27 ENCOUNTER — Other Ambulatory Visit: Payer: Self-pay

## 2022-05-27 DIAGNOSIS — R112 Nausea with vomiting, unspecified: Secondary | ICD-10-CM

## 2022-05-27 DIAGNOSIS — H53149 Visual discomfort, unspecified: Secondary | ICD-10-CM

## 2022-05-27 DIAGNOSIS — G43809 Other migraine, not intractable, without status migrainosus: Secondary | ICD-10-CM

## 2022-05-27 MED ORDER — METOCLOPRAMIDE HCL 5 MG/ML IJ SOLN: 15.0000 mg | Freq: Once | INTRAVENOUS | Status: DC

## 2022-05-27 MED ORDER — KETOROLAC TROMETHAMINE 10 MG PO TABS: 10.0000 mg | ORAL_TABLET | Freq: Four times a day (QID) | ORAL | 0 refills | Status: DC | PRN

## 2022-05-27 MED ORDER — KETOROLAC TROMETHAMINE 60 MG/2ML IM SOLN
30.0000 mg | Freq: Once | INTRAMUSCULAR | Status: AC
  Administered 2022-05-27: 30 mg via INTRAMUSCULAR

## 2022-05-27 MED ORDER — METOCLOPRAMIDE HCL 5 MG/ML IJ SOLN
10.0000 mg | Freq: Once | INTRAMUSCULAR | Status: AC
  Administered 2022-05-27: 10 mg via INTRAMUSCULAR

## 2022-05-27 NOTE — ED Triage Notes (Signed)
Migraine headache that started around 2pm today and is getting progressively worse. Home meds not helping at this time.

## 2022-05-27 NOTE — ED Provider Notes (Signed)
[facility]    CSN: [id number] Arrival date & time: 05/27/22  1921      History   Chief Complaint Chief Complaint  Patient presents with   Migraine    Migraine headache that started around 2pm today and is getting progressively worse. Home meds not helping at this time.    HPI Courtney Sanchez is a 32 y.o. female presenting for migraine headaches that started about 5 to 6 hours ago.  She says the headache is progressively gotten worse.  It is on the left side of her head and associated with photophobia, n/v, spotty vision. This is not the worst headache she is ever had.  She denies dizziness, weakness, balance or speech problems, fever, cough, congestion, stiff neck, numbness/tingling of extremities, falls/trauma or injury.  Patient has Imitrex which she has tried at home and says it has not helped. Also tried benadryl and zofran several hours ago.  Medical history is significant for migraines and pseudotumor cerebri. Has appt to see neurology in 2 days since migraines have become more frequent (1-2 weekly). Additional history significant for asthma, endometriosis.  HPI  Past Medical History:  Diagnosis Date   Asthma    Endometriosis    Endometriosis    Hidradenitis    Hydronephrosis    IBS (irritable bowel syndrome)    Migraine    Migraine    Pseudotumor cerebri    Sciatica    Scoliosis     Patient Active Problem List   Diagnosis Date Noted   Family history of breast cancer 05/08/2022   Endometriosis 05/08/2022   Menorrhagia with regular cycle 05/08/2022   Symptomatic cholelithiasis    Tobacco use affecting pregnancy in third trimester, antepartum 10/03/2016   Abdominal pain 09/18/2016   Labor and delivery indication for care or intervention 08/30/2016   Pregnancy 07/03/2016   Abdominal pain affecting pregnancy [id number]   Obesity complicating pregnancy, third trimester 06/11/2016   Supervision of high risk pregnancy, antepartum, third trimester  05/28/2016   History of spinal fusion 04/29/2016   BMI 40.0-44.9, adult ([place]) 04/29/2016   Acute left-sided low back pain with left-sided sciatica 03/27/2015   Idiopathic scoliosis 04/20/2012   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   Renal cyst 12/16/2011   Pseudotumor cerebri 01/16/2011   Hydronephrosis of right kidney 10/23/2010   Intermittent asthma 10/23/2010   Migraines 10/23/2010   IBS (irritable bowel syndrome) 10/23/2010    Past Surgical History:  Procedure Laterality Date   CHOLECYSTECTOMY     COLONOSCOPY  2001;2008   KIDNEY SURGERY Right 2012   per pt peice of kidney removed hand has only partial function   LAPAROSCOPY  04/2007   SPINAL FUSION     SPINAL FUSION  2008   SPINAL FUSION     TONSILLECTOMY      OB History     Gravida  1   Para  1   Term  1   Preterm      AB      Living  1      SAB      IAB      Ectopic      Multiple      Live Births  1            Home Medications    Prior to Admission medications   Medication Sig Start Date End Date Taking? Authorizing Provider  ketorolac (TORADOL) 10 MG tablet Take 1 tablet (10 mg total) by mouth every 6 (six) hours  as needed. 05/27/22  Yes Danton Clap, PA-C  buPROPion (WELLBUTRIN XL) 150 MG 24 hr tablet  02/20/18   [provider]  clindamycin (CLEOCIN T) 1 % external solution SMARTSIG:Sparingly Topical Twice Daily    [provider]  clonazePAM (KLONOPIN) 0.5 MG tablet Take by mouth. 03/04/22   [provider]  HUMIRA-CD/UC/HS STARTER 80 MG/0.8ML PNKT Inject into the skin. Patient not taking: Reported on 05/08/2022 04/25/22   [provider]  ondansetron (ZOFRAN-ODT) 4 MG disintegrating tablet Take 1 tablet (4 mg total) by mouth every 6 (six) hours as needed for nausea or vomiting. 04/07/22   Margarette Canada, NP  QUEtiapine Fumarate 150 MG TABS Take by mouth. 04/10/22   [provider]  SUMAtriptan (IMITREX) 25 MG tablet  01/04/18   [provider]   topiramate (TOPAMAX) 25 MG tablet Take by mouth.    [provider]  triamcinolone cream (KENALOG) 0.1 % SMARTSIG:Sparingly Topical Twice Daily PRN    [provider]  venlafaxine (EFFEXOR) 100 MG tablet TAKE 1.5 TABLETS BY MOUTH ONCE DAILY. 12/23/21   [provider]    Family History Family History  Problem Relation Age of Onset   Bipolar disorder Mother    Depression Mother    Breast cancer Mother 67   Cancer Father        Kidney   Depression Maternal Grandmother    Breast cancer Maternal Grandmother 63       contact/    Social History Social History   Tobacco Use   Smoking status: Former    Packs/day: .5    Types: Cigarettes   Smokeless tobacco: Never  Vaping Use   Vaping Use: Every day  Substance Use Topics   Alcohol use: No   Drug use: No     Allergies   Cefaclor, Cefadroxil, Cefuroxime, Cephalexin, Clarithromycin, Clindamycin, Erythromycin, Sulfamethoxazole-trimethoprim, Vitamin a, Morphine, Other, Azithromycin, and Red dye   Review of Systems Review of Systems  Constitutional:  Negative for fatigue and fever.  HENT:  Negative for congestion, rhinorrhea and sore throat.   Eyes:  Positive for photophobia and visual disturbance.  Respiratory:  Negative for cough.   Gastrointestinal:  Positive for nausea and vomiting.  Musculoskeletal:  Negative for neck pain and neck stiffness.  Neurological:  Positive for headaches. Negative for dizziness, syncope, speech difficulty, weakness and numbness.     Physical Exam Triage Vital Signs ED Triage Vitals  Enc Vitals Group     BP      Pulse      Resp      Temp      Temp src      SpO2      Weight      Height      Head Circumference      Peak Flow      Pain Score      Pain Loc      Pain Edu?      Excl. in [place]?    No data found.  Updated Vital Signs BP (!) 163/117   Pulse 80   Temp 98.9 F (37.2 C)   Resp 20   LMP 04/28/2022 (Approximate)   SpO2 98%       Physical  Exam Vitals and nursing note reviewed.  Constitutional:      General: She is not in acute distress.    Appearance: Normal appearance. She is not ill-appearing or toxic-appearing.  HENT:     Head: Normocephalic and atraumatic.  Nose: Nose normal.     Mouth/Throat:     Mouth: Mucous membranes are moist.     Pharynx: Oropharynx is clear.  Eyes:     General: No scleral icterus.       Right eye: No discharge.        Left eye: No discharge.     Extraocular Movements: Extraocular movements intact.     Conjunctiva/sclera: Conjunctivae normal.     Pupils: Pupils are equal, round, and reactive to light.  Cardiovascular:     Rate and Rhythm: Normal rate and regular rhythm.     Heart sounds: Normal heart sounds.  Pulmonary:     Effort: Pulmonary effort is normal. No respiratory distress.     Breath sounds: Normal breath sounds.  Musculoskeletal:     Cervical back: Neck supple.  Skin:    General: Skin is dry.  Neurological:     General: No focal deficit present.     Mental Status: She is alert and oriented to person, place, and time. Mental status is at baseline.     Motor: No weakness.     Gait: Gait normal.  Psychiatric:        Mood and Affect: Mood normal.        Behavior: Behavior normal.        Thought Content: Thought content normal.      UC Treatments / Results  Labs (all labs ordered are listed, but only abnormal results are displayed) Labs Reviewed - No data to display  EKG   Radiology No results found.  Procedures Procedures (including critical care time)  Medications Ordered in UC Medications  ketorolac (TORADOL) injection 30 mg (30 mg Intramuscular Given 05/27/22 1954)  metoCLOPramide (REGLAN) injection 10 mg (10 mg Intramuscular Given 05/27/22 2003)    Initial Impression / Assessment and Plan / UC Course  I have reviewed the triage vital signs and the nursing notes.  Pertinent labs & imaging results that were available during my care of the patient  were reviewed by me and considered in my medical decision making (see chart for details).   32 year old female with history of migraines presents for migraine headache for the past several hours.  Symptoms consistent with migraines she has had in the past.  Denies any injury, red flag signs or symptoms.  Has tried home medications without relief.  Patient has had to go to urgent care or emergency department in the past for migraine headache relief.  BP 163/117.  She is overall well-appearing but appears uncomfortable with her migraine headache.  Normal neurological exam.  Full strength of lower extremities and normal gait.  Normal HEENT exam.  Patient given 30 mg IM ketorolac and 15 mg IM Reglan in clinic for acute migraine headache relief.  Father driving her home.  Advised patient to go to emergency department if headache worsens or she develops any red flag signs or symptoms which we discussed.  Advised her to continue with home medications as needed for headache. F/u with neurology as scheduled in 2 days.   Final Clinical Impressions(s) / UC Diagnoses   Final diagnoses:  Other migraine without status migrainosus, not intractable  Nausea and vomiting, unspecified vomiting type  Photophobia     Discharge Instructions      HEADACHE: You were seen in clinic today for headache. Rest and take meds as directed. If at any point, the headache becomes very severe, is associated with fever, is associated with neck pain/stiffness, you feel like passing  out, the headache is different from any you've have had before, there are vision changes/issues with speech/issues with balance, or numbness/weakness in a part of the body, you should be seen urgently or emergently for more serious causes of headache      ED Prescriptions     Medication Sig Dispense Auth. Provider   ketorolac (TORADOL) 10 MG tablet Take 1 tablet (10 mg total) by mouth every 6 (six) hours as needed. 20 tablet Gretta Cool      PDMP not reviewed this encounter.   Danton Clap, PA-C 05/27/22 2006

## 2022-05-27 NOTE — Discharge Instructions (Addendum)
HEADACHE: You were seen in clinic today for headache. Rest and take meds as directed. If at any point, the headache becomes very severe, is associated with fever, is associated with neck pain/stiffness, you feel like passing out, the headache is different from any you've have had before, there are vision changes/issues with speech/issues with balance, or numbness/weakness in a part of the body, you should be seen urgently or emergently for more serious causes of headache  °

## 2022-06-12 NOTE — Telephone Encounter (Signed)
Patient seen 05/08/22.

## 2022-06-21 ENCOUNTER — Emergency Department: Payer: Medicaid Other

## 2022-06-21 ENCOUNTER — Emergency Department
Admission: EM | Admit: 2022-06-21 | Discharge: 2022-06-21 | Disposition: A | Discharge status: Home / Self Care | Payer: Medicaid Other | Attending: Emergency Medicine | Admitting: Emergency Medicine

## 2022-06-21 DIAGNOSIS — Y92000 Kitchen of unspecified non-institutional (private) residence as  the place of occurrence of the external cause: Secondary | ICD-10-CM | POA: Insufficient documentation

## 2022-06-21 DIAGNOSIS — W1830XA Fall on same level, unspecified, initial encounter: Secondary | ICD-10-CM | POA: Insufficient documentation

## 2022-06-21 DIAGNOSIS — R55 Syncope and collapse: Secondary | ICD-10-CM | POA: Diagnosis present

## 2022-06-21 DIAGNOSIS — T887XXA Unspecified adverse effect of drug or medicament, initial encounter: Secondary | ICD-10-CM | POA: Insufficient documentation

## 2022-06-21 LAB — CBC WITH DIFFERENTIAL/PLATELET
Abs Immature Granulocytes: 0.06 10*3/uL (ref 0.00–0.07)
Basophils Absolute: 0.1 10*3/uL (ref 0.0–0.1)
Basophils Relative: 1 %
Eosinophils Absolute: 0.2 10*3/uL (ref 0.0–0.5)
Eosinophils Relative: 2 %
HCT: 42.7 % (ref 36.0–46.0)
Hemoglobin: 14.2 g/dL (ref 12.0–15.0)
Immature Granulocytes: 1 %
Lymphocytes Relative: 26 %
Lymphs Abs: 3.3 10*3/uL (ref 0.7–4.0)
MCH: 29.5 pg (ref 26.0–34.0)
MCHC: 33.3 g/dL (ref 30.0–36.0)
MCV: 88.6 fL (ref 80.0–100.0)
Monocytes Absolute: 0.7 10*3/uL (ref 0.1–1.0)
Monocytes Relative: 5 %
Neutro Abs: 8.3 10*3/uL — ABNORMAL HIGH (ref 1.7–7.7)
Neutrophils Relative %: 65 %
Platelets: 350 10*3/uL (ref 150–400)
RBC: 4.82 MIL/uL (ref 3.87–5.11)
RDW: 12.8 % (ref 11.5–15.5)
WBC: 12.7 10*3/uL — ABNORMAL HIGH (ref 4.0–10.5)
nRBC: 0 % (ref 0.0–0.2)

## 2022-06-21 LAB — COMPREHENSIVE METABOLIC PANEL
ALT: 13 U/L (ref 0–44)
AST: 16 U/L (ref 15–41)
Albumin: 3.8 g/dL (ref 3.5–5.0)
Alkaline Phosphatase: 78 U/L (ref 38–126)
Anion gap: 8 (ref 5–15)
BUN: 18 mg/dL (ref 6–20)
CO2: 21 mmol/L — ABNORMAL LOW (ref 22–32)
Calcium: 9 mg/dL (ref 8.9–10.3)
Chloride: 109 mmol/L (ref 98–111)
Creatinine, Ser: 1.07 mg/dL — ABNORMAL HIGH (ref 0.44–1.00)
GFR, Estimated: >60 mL/min (ref >60)
Glucose, Bld: 146 mg/dL — ABNORMAL HIGH (ref 70–99)
Potassium: 3.9 mmol/L (ref 3.5–5.1)
Sodium: 138 mmol/L (ref 135–145)
Total Bilirubin: 0.7 mg/dL (ref 0.3–1.2)
Total Protein: 6.7 g/dL (ref 6.5–8.1)

## 2022-06-21 MED ORDER — LACTATED RINGERS IV BOLUS
1000.0000 mL | Freq: Once | INTRAVENOUS | Status: AC
  Administered 2022-06-21: 1000 mL via INTRAVENOUS

## 2022-06-21 NOTE — ED Provider Notes (Signed)
[facility] Provider Note    Event Date/Time   First MD Initiated Contact with Patient 06/21/22 0127     (approximate)   History   Near Syncope (Took Seroquel and half CBD/ Delta 9 gummy. Was walking in the kitchen and fell, striking her head. Mother called EMS)   HPI  Courtney Sanchez is a 32 y.o. female who presents to the ED for evaluation of Near Syncope (Took Seroquel and half CBD/ Delta 9 gummy. Was walking in the kitchen and fell, striking her head. Mother called EMS)   Patient presents to the ED from home via EMS for evaluation of dizziness of fall and possible syncope.  She typically takes Seroquel every night to help her sleep, but tonight she also took a delta 9 THC gummy for the first time alongside her typical Seroquel.  She was laying in bed, feeling okay and just hanging out when her daughter asked her to make her something to eat, so she popped up from bed, reports feeling dizziness and fell to the ground and had a possible syncopal episode.  EMS reports soft pressures with them, improved with IV fluids.  Here in the ED, she reports feeling better but some mild dizziness.  No preceding illnesses.  She was feeling fine before she got up   Physical Exam   Triage Vital Signs: ED Triage Vitals  Enc Vitals Group     BP      Pulse      Resp      Temp      Temp src      SpO2      Weight      Height      Head Circumference      Peak Flow      Pain Score      Pain Loc      Pain Edu?      Excl. in [place]?     Most recent vital signs: Vitals:   06/21/22 0230 06/21/22 0345  BP: (!) 97/59 109/61  Pulse:  96  Resp: 13 16  Temp:  98 F (36.7 C)  SpO2:  98%    General: Awake, no distress.  CV:  Good peripheral perfusion.  Resp:  Normal effort.  Abd:  No distention.  MSK:  No deformity noted.  No signs of trauma Neuro:  No focal deficits appreciated. Cranial nerves II through XII intact 5/5 strength and sensation in all 4  extremities Other:     ED Results / Procedures / Treatments   Labs (all labs ordered are listed, but only abnormal results are displayed) Labs Reviewed  COMPREHENSIVE METABOLIC PANEL - Abnormal; Notable for the following components:      Result Value   CO2 21 (*)    Glucose, Bld 146 (*)    Creatinine, Ser 1.07 (*)    All other components within normal limits  CBC WITH DIFFERENTIAL/PLATELET - Abnormal; Notable for the following components:   WBC 12.7 (*)    Neutro Abs 8.3 (*)    All other components within normal limits  URINALYSIS, ROUTINE W REFLEX MICROSCOPIC    EKG Sinus rhythm with a rate of 92 bpm.  Normal axis and intervals.  No clear signs of acute ischemia  RADIOLOGY CT head interpreted by me without evidence of acute intracranial pathology CT cervical spine interpreted by me without evidence of fracture or dislocation  Official radiology report(s): CT Cervical Spine Wo Contrast  Result Date:  06/21/2022 CLINICAL DATA:  Possible syncope and subsequent fall. EXAM: CT CERVICAL SPINE WITHOUT CONTRAST TECHNIQUE: Multidetector CT imaging of the cervical spine was performed without intravenous contrast. Multiplanar CT image reconstructions were also generated. RADIATION DOSE REDUCTION: This exam was performed according to the departmental dose-optimization program which includes automated exposure control, adjustment of the mA and/or kV according to patient size and/or use of iterative reconstruction technique. COMPARISON:  None Available. FINDINGS: Alignment: Normal. Skull base and vertebrae: No acute fracture. No primary bone lesion or focal pathologic process. Soft tissues and spinal canal: No prevertebral fluid or swelling. No visible canal hematoma. Disc levels: Normal multilevel endplates are seen with normal multilevel intervertebral disc spaces. Normal bilateral multilevel facet joints are noted. Upper chest: Negative. Other: None. IMPRESSION: No acute fracture or subluxation  in the cervical spine. Electronically Signed   By: Aram Candela M.D.   On: 06/21/2022 01:54   CT HEAD WO CONTRAST ( )  Result Date: 06/21/2022 CLINICAL DATA:  Syncope and subsequent fall. EXAM: CT HEAD WITHOUT CONTRAST TECHNIQUE: Contiguous axial images were obtained from the base of the skull through the vertex without intravenous contrast. RADIATION DOSE REDUCTION: This exam was performed according to the departmental dose-optimization program which includes automated exposure control, adjustment of the mA and/or kV according to patient size and/or use of iterative reconstruction technique. COMPARISON:  None Available. FINDINGS: Brain: No evidence of acute infarction, hemorrhage, hydrocephalus, extra-axial collection or mass lesion/mass effect. Vascular: No hyperdense vessel or unexpected calcification. Skull: Normal. Negative for fracture or focal lesion. Sinuses/Orbits: No acute finding. Other: None. IMPRESSION: No acute intracranial pathology. Electronically Signed   By: Aram Candela M.D.   On: 06/21/2022 01:53    PROCEDURES and INTERVENTIONS:  .1-3 Lead EKG Interpretation  Performed by: Delton Prairie, MD Authorized by: Delton Prairie, MD     Interpretation: normal     ECG rate:  90   ECG rate assessment: normal     Rhythm: sinus rhythm     Ectopy: none     Conduction: normal     Medications  lactated ringers bolus 1,000 mL (0 mLs Intravenous Stopped 06/21/22 0352)     IMPRESSION / MDM / ASSESSMENT AND PLAN / ED COURSE  I reviewed the triage vital signs and the nursing notes.  Differential diagnosis includes, but is not limited to, medication side effect or polysubstance abuse, vasovagal episode, seizure, cardiac dysrhythmia, skull fracture, ICH  {Patient presents with symptoms of an acute illness or injury that is potentially life-threatening.  Patient presents with a presyncopal versus syncopal episode that is likely multifactorial from vasovagal episode and the delta 9  THC Gummies that she had this evening, suitable for outpatient management.  She looks well and no signs of trauma or injuries from falling to the ground and striking her head.  CT head and neck are reassuring and she has no evidence of neurologic deficits.  Blood work is benign with a marginal leukocytosis but I doubt infectious etiology of her symptoms.  Metabolic panel with normal electrolytes.  No dysrhythmias on the monitor and EKG with normal intervals.  No reported seizure activity.  Doubt cardiogenic syncope versus seizure.  Remains asymptomatic without dysrhythmias after being observed for 2.5 hours.  Suitable for trial of outpatient management.    Clinical Course as of 06/21/22 0352  Sat Jun 21, 2022  0306 Reassessed. Mom at the bedside. Pt reports feeling better. Asking about going home. Reaffirms no urinary symptoms [DS]    Clinical Course User  Index [DS] Delton Prairie, MD     FINAL CLINICAL IMPRESSION(S) / ED DIAGNOSES   Final diagnoses:  Vasovagal episode  Medication side effect     Rx / DC Orders   ED Discharge Orders     None        Note:  This document was prepared using Dragon voice recognition software and may include unintentional dictation errors.   Delton Prairie, MD 06/21/22 [phone]

## 2022-07-18 ENCOUNTER — Ambulatory Visit
Admission: EM | Admit: 2022-07-18 | Discharge: 2022-07-18 | Disposition: A | Discharge status: Home / Self Care | Payer: Medicaid Other | Attending: Physician Assistant | Admitting: Physician Assistant

## 2022-07-18 ENCOUNTER — Encounter: Payer: Self-pay | Admitting: Emergency Medicine

## 2022-07-18 DIAGNOSIS — M25561 Pain in right knee: Secondary | ICD-10-CM

## 2022-07-18 MED ORDER — NAPROXEN 500 MG PO TABS: 500.0000 mg | ORAL_TABLET | Freq: Two times a day (BID) | ORAL | 0 refills | Status: DC

## 2022-07-18 MED ORDER — KETOROLAC TROMETHAMINE 60 MG/2ML IM SOLN
30.0000 mg | Freq: Once | INTRAMUSCULAR | Status: AC
  Administered 2022-07-18: 30 mg via INTRAMUSCULAR
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
## 2022-07-18 NOTE — ED Triage Notes (Signed)
Patient c/o right knee pain when she woke up Wed morning.  Patient denies injury or fall.

## 2022-07-18 NOTE — Discharge Instructions (Addendum)
KNEE PAIN: Stressed avoiding painful activities . Reviewed RICE guidelines. Use medications as directed, including NSAIDs. If no NSAIDs have been prescribed for you today, you may take Aleve or Motrin over the counter. May use Tylenol in between doses of NSAIDs.  If no improvement in the next 1-2 weeks, f/u with orthopedics.   You have a condition requiring you to follow up with Orthopedics so please call one of the following office for appointment:   [facility] [address] [place], [place] [address] Phone: [phone]  [facility] [address], [place], [place] [address] Phone: [phone]

## 2022-07-22 ENCOUNTER — Ambulatory Visit (INDEPENDENT_AMBULATORY_CARE_PROVIDER_SITE_OTHER): Payer: Medicaid Other

## 2022-07-22 VITALS — BP 137/97 | HR 85 | Wt 262.4 lb

## 2022-07-22 DIAGNOSIS — Z3042 Encounter for surveillance of injectable contraceptive: Secondary | ICD-10-CM | POA: Diagnosis not present

## 2022-07-22 MED ORDER — MEDROXYPROGESTERONE ACETATE 150 MG/ML IM SUSY
150.0000 mg | PREFILLED_SYRINGE | Freq: Once | INTRAMUSCULAR | Status: AC
  Administered 2022-07-22: 150 mg via INTRAMUSCULAR

## 2022-07-22 NOTE — Progress Notes (Signed)
    NURSE VISIT NOTE  Subjective:    Patient ID: Courtney Sanchez, female    DOB: [birth year], 32 y.o.   MRN: [id number]  HPI  Patient is a 32 y.o. G39P1001 female who presents for depo provera injection.   Objective:    Wt 262 lb 6.4 oz (119 kg)   LMP 07/19/2022   BMI 43.67 kg/m   Last Annual: 05/08/2022. Last pap: 05/08/2022. Last Depo-Provera: 05/08/2022. Side Effects if any: none. Serum HCG indicated? No . Depo-Provera 150 mg IM given by: Doristine Devoid, CMA. Site: Left Upper Outer Quandrant  Lab Review  @THIS  VISIT ONLY@  Assessment:   1. Encounter for Depo-Provera contraception      Plan:   Next appointment due between August 6th and August 20th.    Burtis Junes, CMA

## 2022-07-31 ENCOUNTER — Ambulatory Visit: Payer: Medicaid Other

## 2022-10-14 ENCOUNTER — Ambulatory Visit: Payer: Medicaid Other

## 2022-10-16 ENCOUNTER — Ambulatory Visit (INDEPENDENT_AMBULATORY_CARE_PROVIDER_SITE_OTHER): Payer: Medicaid Other

## 2022-10-16 VITALS — BP 128/88 | Ht 65.0 in | Wt 256.0 lb

## 2022-10-16 DIAGNOSIS — Z3042 Encounter for surveillance of injectable contraceptive: Secondary | ICD-10-CM | POA: Diagnosis not present

## 2022-10-16 MED ORDER — MEDROXYPROGESTERONE ACETATE 150 MG/ML IM SUSY
150.0000 mg | PREFILLED_SYRINGE | Freq: Once | INTRAMUSCULAR | Status: AC
  Administered 2022-10-16: 150 mg via INTRAMUSCULAR

## 2022-10-16 NOTE — Progress Notes (Addendum)
    NURSE VISIT NOTE  Subjective:    Patient ID: Courtney Sanchez, female    DOB: May 07, 1990, 32 y.o.   MRN: [id number]  HPI  Patient is a 32 y.o. G26P1001 female who presents for depo provera injection.   Objective:    BP 128/88   Ht 5\' 5"  (1.651 m)   Wt 256 lb (116.1 kg)   BMI 42.60 kg/m   Last Annual: 05/08/2022. Last pap: 05/07/2025. Last Depo-Provera: 07/22/2022. Side Effects if any: none. Serum HCG indicated? No . Depo-Provera 150 mg IM given by: Beverely Pace, CMA. Site: right Deltoid   Assessment:   1. Encounter for Depo-Provera contraception      Plan:   Next appointment due between oct 31-Nov 14   Loney Laurence, CMA

## 2022-12-02 ENCOUNTER — Ambulatory Visit
Admission: EM | Admit: 2022-12-02 | Discharge: 2022-12-02 | Disposition: A | Discharge status: Home / Self Care | Payer: Medicaid Other

## 2022-12-02 DIAGNOSIS — S29019A Strain of muscle and tendon of unspecified wall of thorax, initial encounter: Secondary | ICD-10-CM

## 2022-12-02 MED ORDER — BACLOFEN 10 MG PO TABS: 10.0000 mg | ORAL_TABLET | Freq: Three times a day (TID) | ORAL | 0 refills | Status: DC

## 2022-12-02 MED ORDER — IBUPROFEN 600 MG PO TABS: 600.0000 mg | ORAL_TABLET | Freq: Four times a day (QID) | ORAL | 0 refills | Status: DC | PRN

## 2022-12-02 NOTE — ED Triage Notes (Signed)
Hx of back pain. Pain feels like cramping and pressure. X 3 day  Mid back

## 2022-12-02 NOTE — Discharge Instructions (Addendum)

## 2022-12-02 NOTE — ED Provider Notes (Signed)
[facility]    CSN: [id number] Arrival date & time: 12/02/22  1650      History   Chief Complaint Chief Complaint  Patient presents with   Back Pain    HPI Courtney Sanchez is a 32 y.o. female.   HPI  32 year old female with a past medical history significant for pseudotumor cerebri, migraines, IBS, hydronephrosis, hidradenitis, endometriosis, asthma, and sciatica presents for evaluation of right sided mid thoracic back pain that is been going on for the last 3 days.  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  She also denies any falls or injuries.  No provoking or relieving symptoms.  She has not take anything for the pain.  Past Medical History:  Diagnosis Date   Asthma    Endometriosis    Endometriosis    Hidradenitis    Hydronephrosis    IBS (irritable bowel syndrome)    Migraine    Migraine    Pseudotumor cerebri    Sciatica    Scoliosis     Patient Active Problem List   Diagnosis Date Noted   Family history of breast cancer 05/08/2022   Endometriosis 05/08/2022   Menorrhagia with regular cycle 05/08/2022   Symptomatic cholelithiasis    Tobacco use affecting pregnancy in third trimester, antepartum 10/03/2016   Abdominal pain 09/18/2016   Labor and delivery indication for care or intervention 08/30/2016   Pregnancy 07/03/2016   Abdominal pain affecting pregnancy 06/14/2016   Obesity complicating pregnancy, third trimester 06/11/2016   Supervision of high risk pregnancy, antepartum, third trimester 05/28/2016   History of spinal fusion 04/29/2016   BMI 40.0-44.9, adult (HCC) 04/29/2016   Acute left-sided low back pain with left-sided sciatica 03/27/2015   Idiopathic scoliosis 04/20/2012   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   Renal cyst 12/16/2011   Pseudotumor cerebri 01/16/2011   Hydronephrosis of right kidney 10/23/2010   Intermittent asthma 10/23/2010   Migraines 10/23/2010   IBS (irritable bowel syndrome)  10/23/2010    Past Surgical History:  Procedure Laterality Date   CHOLECYSTECTOMY     COLONOSCOPY  2001;2008   KIDNEY SURGERY Right 2012   per pt peice of kidney removed hand has only partial function   LAPAROSCOPY  04/2007   SPINAL FUSION     SPINAL FUSION  2008   SPINAL FUSION     TONSILLECTOMY      OB History     Gravida  1   Para  1   Term  1   Preterm      AB      Living  1      SAB      IAB      Ectopic      Multiple      Live Births  1            Home Medications    Prior to Admission medications   Medication Sig Start Date End Date Taking? Authorizing Provider  AJOVY 225 MG/1.5ML SOAJ Inject into the skin. 11/28/22  Yes [provider]  baclofen (LIORESAL) 10 MG tablet Take 1 tablet (10 mg total) by mouth 3 (three) times daily. 12/02/22  Yes Becky Augusta, NP  clonazePAM (KLONOPIN) 0.5 MG tablet Take by mouth. 03/04/22  Yes [provider]  escitalopram (LEXAPRO) 5 MG tablet Take by mouth. 12/01/22  Yes [provider]  hydrOXYzine (ATARAX) 10 MG tablet Take by mouth. 11/11/22  Yes [provider]  ibuprofen (ADVIL) 600 MG tablet Take 1 tablet (600 mg total) by mouth every 6 (six) hours as needed. 12/02/22  Yes Becky Augusta, NP  QUEtiapine Fumarate 150 MG TABS Take by mouth. 04/10/22  Yes [provider]  buPROPion (WELLBUTRIN XL) 150 MG 24 hr tablet  02/20/18   [provider]  clindamycin (CLEOCIN T) 1 % external solution SMARTSIG:Sparingly Topical Twice Daily    [provider]  HUMIRA-CD/UC/HS STARTER 80 MG/0.8ML PNKT Inject into the skin. 04/25/22   [provider]  naproxen (NAPROSYN) 500 MG tablet Take 1 tablet (500 mg total) by mouth 2 (two) times daily. 07/18/22   Eusebio Friendly B, PA-C  ondansetron (ZOFRAN-ODT) 4 MG disintegrating tablet Take 1 tablet (4 mg total) by mouth every 6 (six) hours as needed for nausea or vomiting. 04/07/22   Becky Augusta, NP  SUMAtriptan (IMITREX) 25  MG tablet  01/04/18   [provider]  topiramate (TOPAMAX) 25 MG tablet Take by mouth.    [provider]  triamcinolone cream (KENALOG) 0.1 % SMARTSIG:Sparingly Topical Twice Daily PRN    [provider]  venlafaxine (EFFEXOR) 100 MG tablet TAKE 1.5 TABLETS BY MOUTH ONCE DAILY. 12/23/21   [provider]    Family History Family History  Problem Relation Age of Onset   Bipolar disorder Mother    Depression Mother    Breast cancer Mother 40   Cancer Father        Kidney   Depression Maternal Grandmother    Breast cancer Maternal Grandmother 26       contact/    Social History Social History   Tobacco Use   Smoking status: Former    Current packs/day: 0.50    Types: Cigarettes   Smokeless tobacco: Never  Vaping Use   Vaping status: Every Day  Substance Use Topics   Alcohol use: No   Drug use: No     Allergies   Cefaclor, Cefadroxil, Cefuroxime, Cephalexin, Clarithromycin, Clindamycin, Erythromycin, Sulfamethoxazole-trimethoprim, Vitamin a, Morphine, Other, Azithromycin, and Red dye #40 (allura red)   Review of Systems Review of Systems  Genitourinary:  Negative for dysuria, frequency, hematuria and urgency.  Musculoskeletal:  Positive for back pain.     Physical Exam Triage Vital Signs ED Triage Vitals  Encounter Vitals Group     BP      Systolic BP Percentile      Diastolic BP Percentile      Pulse      Resp      Temp      Temp src      SpO2      Weight      Height      Head Circumference      Peak Flow      Pain Score      Pain Loc      Pain Education      Exclude from Growth Chart    No data found.  Updated Vital Signs BP (!) 126/104 (BP Location: Left Arm)   Pulse 93   Temp 98.9 F (37.2 C) (Oral)   Resp 17   SpO2 100%   Visual Acuity Right Eye Distance:   Left Eye Distance:   Bilateral Distance:    Right Eye Near:   Left Eye Near:    Bilateral Near:     Physical Exam Vitals and nursing note  reviewed.  Constitutional:      Appearance: Normal appearance.  Musculoskeletal:  General: Tenderness present. No deformity or signs of injury. Normal range of motion.  Skin:    General: Skin is warm and dry.     Capillary Refill: Capillary refill takes less than 2 seconds.  Neurological:     General: No focal deficit present.     Mental Status: She is alert and oriented to person, place, and time.      UC Treatments / Results  Labs (all labs ordered are listed, but only abnormal results are displayed) Labs Reviewed - No data to display  EKG   Radiology No results found.  Procedures Procedures (including critical care time)  Medications Ordered in UC Medications - No data to display  Initial Impression / Assessment and Plan / UC Course  I have reviewed the triage vital signs and the nursing notes.  Pertinent labs & imaging results that were available during my care of the patient were reviewed by me and considered in my medical decision making (see chart for details).   Patient is a pleasant, nontoxic-appearing 32 year old female presenting for evaluation of right thoracic back pain as outlined HPI above.  She has no midline spinous process tenderness or step-off.  She does have tenderness with palpation of the right paraspinous region with no overt spasm.  There is muscle tension present on exam.  The pain is worsened by lateral rotation as well as forward flexion and extension.  Patient is a most consistent with thoracic musculoskeletal back pain and I will treat her with a combination of 600 mg of ibuprofen and 10 mg of baclofen.  She is to take both on a schedule for the first 2 days and then back off to an as-needed basis.  Additionally, we discussed using moist heat as well as home physical therapy.  If her symptoms not improve she should return for reevaluation or see her PCP.   Final Clinical Impressions(s) / UC Diagnoses   Final diagnoses:  Thoracic  myofascial strain, initial encounter     Discharge Instructions      Take the ibuprofen, 600 mg every 6 hours with food, on a schedule for the next 48 hours and then as needed.  Take the baclofen, 10 mg every 8 hours, on a schedule for the next 48 hours and then as needed.  Apply moist heat to your back for 30 minutes at a time 2-3 times a day to improve blood flow to the area and help remove the lactic acid causing the spasm.  Follow the back exercises given at discharge.  Return for reevaluation for any new or worsening symptoms.      ED Prescriptions     Medication Sig Dispense Auth. Provider   ibuprofen (ADVIL) 600 MG tablet Take 1 tablet (600 mg total) by mouth every 6 (six) hours as needed. 30 tablet Becky Augusta, NP   baclofen (LIORESAL) 10 MG tablet Take 1 tablet (10 mg total) by mouth 3 (three) times daily. 30 each Becky Augusta, NP      PDMP not reviewed this encounter.   Becky Augusta, NP 12/02/22 [phone]

## 2022-12-30 ENCOUNTER — Ambulatory Visit: Payer: Medicaid Other

## 2023-01-07 ENCOUNTER — Ambulatory Visit (INDEPENDENT_AMBULATORY_CARE_PROVIDER_SITE_OTHER): Payer: Medicaid Other

## 2023-01-07 VITALS — BP 139/87 | HR 86 | Ht 65.0 in | Wt 257.3 lb

## 2023-01-07 DIAGNOSIS — Z3042 Encounter for surveillance of injectable contraceptive: Secondary | ICD-10-CM

## 2023-01-07 MED ORDER — MEDROXYPROGESTERONE ACETATE 150 MG/ML IM SUSP
150.0000 mg | Freq: Once | INTRAMUSCULAR | Status: AC
Start: 2023-01-07 — End: 2023-01-07
  Administered 2023-01-07: 150 mg via INTRAMUSCULAR

## 2023-01-07 NOTE — Progress Notes (Signed)
    NURSE VISIT NOTE  Subjective:    Patient ID: Courtney Sanchez, female    DOB: 1990/12/15, 32 y.o.   MRN: [id number]  HPI  Patient is a 32 y.o. G68P1001 female who presents for depo provera injection.   Objective:    BP 139/87   Pulse 86   Ht 5\' 5"  (1.651 m)   Wt 257 lb 4.8 oz (116.7 kg)   BMI 42.82 kg/m   Last Annual: 05/08/22. Last pap: 05/08/22. Last Depo-Provera: 10/16/22. Side Effects if any: n/a. Serum HCG indicated? No . Depo-Provera 150 mg IM given by: Georgiana Shore, CMA. Site: Right Deltoid   Assessment:   1. Encounter for Depo-Provera contraception      Plan:   Next appointment due between 03/25/23 and 04/08/23.    Loman Chroman, CMA

## 2023-01-08 ENCOUNTER — Telehealth: Payer: Self-pay

## 2023-01-08 NOTE — Telephone Encounter (Signed)
Pt aware.

## 2023-01-08 NOTE — Telephone Encounter (Signed)
-----   Message from [place] Copland sent at 01/07/2023  5:49 PM EST ----- Regarding: FW: bleeding on depo Pls notify pt that is normal to have some BTB before next depo. The longer one is on depo the more BTB they can have.  Helmut Muster ----- Message ----- From: Loman Chroman, CMA Sent: 01/07/2023   3:47 PM EST To: [name], PA-C Subject: bleeding on depo                               Patient came in yesterday for a depo injection. She stated over the past three injections she has had a week of irregular bleeding prior to her shot. Advised her some bleeding towards the end is normal but wanted to make you aware. She stated she has been using depo since 8th grade minus 2-3 years when she was attempting pregnancy and then pregnant. Just wanted to make you aware in case there may be anything you would like to do additionally.  Courtney Sanchez

## 2023-02-23 ENCOUNTER — Ambulatory Visit
Admission: EM | Admit: 2023-02-23 | Discharge: 2023-02-23 | Disposition: A | Discharge status: Home / Self Care | Payer: Medicaid Other | Attending: Family Medicine | Admitting: Family Medicine

## 2023-02-23 DIAGNOSIS — J069 Acute upper respiratory infection, unspecified: Secondary | ICD-10-CM

## 2023-02-23 DIAGNOSIS — H66002 Acute suppurative otitis media without spontaneous rupture of ear drum, left ear: Secondary | ICD-10-CM | POA: Diagnosis not present

## 2023-02-23 MED ORDER — AMOXICILLIN-POT CLAVULANATE 875-125 MG PO TABS: 1.0000 | ORAL_TABLET | Freq: Two times a day (BID) | ORAL | 0 refills | Status: DC

## 2023-02-23 MED ORDER — PROMETHAZINE-DM 6.25-15 MG/5ML PO SYRP: 5.0000 mL | ORAL_SOLUTION | Freq: Four times a day (QID) | ORAL | 0 refills | Status: DC | PRN

## 2023-02-23 MED ORDER — IPRATROPIUM BROMIDE 0.06 % NA SOLN: 2.0000 | Freq: Four times a day (QID) | NASAL | 12 refills | Status: DC

## 2023-02-23 MED ORDER — BENZONATATE 100 MG PO CAPS: 200.0000 mg | ORAL_CAPSULE | Freq: Three times a day (TID) | ORAL | 0 refills | Status: DC

## 2023-02-23 NOTE — ED Provider Notes (Signed)
[facility]    CSN: [id number] Arrival date & time: 02/23/23  1228      History   Chief Complaint Chief Complaint  Patient presents with   Otalgia    HPI Courtney Sanchez is a 32 y.o. female.   HPI  32 year old female with a past medical history significant for asthma, endometriosis, hidradenitis, hydronephrosis, IBS, migraines, pseudotumor cerebri, sciatica, and scoliosis presents for evaluation of 2 days with the respiratory symptoms which include nasal congestion with clear nasal discharge, facial pain, pain in both of her ears, and a nonproductive cough.  She denies any fever, shortness breath, or wheezing.  Past Medical History:  Diagnosis Date   Asthma    Endometriosis    Endometriosis    Hidradenitis    Hydronephrosis    IBS (irritable bowel syndrome)    Migraine    Migraine    Pseudotumor cerebri    Sciatica    Scoliosis     Patient Active Problem List   Diagnosis Date Noted   Family history of breast cancer 05/08/2022   Endometriosis 05/08/2022   Menorrhagia with regular cycle 05/08/2022   Symptomatic cholelithiasis    Tobacco use affecting pregnancy in third trimester, antepartum 10/03/2016   Abdominal pain 09/18/2016   Labor and delivery indication for care or intervention 08/30/2016   Pregnancy 07/03/2016   Abdominal pain affecting pregnancy 06/14/2016   Obesity complicating pregnancy, third trimester 06/11/2016   Supervision of high risk pregnancy, antepartum, third trimester 05/28/2016   History of spinal fusion 04/29/2016   BMI 40.0-44.9, adult (HCC) 04/29/2016   Acute left-sided low back pain with left-sided sciatica 03/27/2015   Idiopathic scoliosis 04/20/2012   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   Renal cyst 12/16/2011   Pseudotumor cerebri 01/16/2011   Hydronephrosis of right kidney 10/23/2010   Intermittent asthma 10/23/2010   Migraines 10/23/2010   IBS (irritable bowel syndrome) 10/23/2010    Past Surgical History:   Procedure Laterality Date   CHOLECYSTECTOMY     COLONOSCOPY  2001;2008   KIDNEY SURGERY Right 2012   per pt peice of kidney removed hand has only partial function   LAPAROSCOPY  04/2007   SPINAL FUSION     SPINAL FUSION  2008   SPINAL FUSION     TONSILLECTOMY      OB History     Gravida  1   Para  1   Term  1   Preterm      AB      Living  1      SAB      IAB      Ectopic      Multiple      Live Births  1            Home Medications    Prior to Admission medications   Medication Sig Start Date End Date Taking? Authorizing Provider  AJOVY 225 MG/1.5ML SOAJ Inject into the skin. 11/28/22  Yes [provider]  amoxicillin-clavulanate (AUGMENTIN) 875-125 MG tablet Take 1 tablet by mouth every 12 (twelve) hours for 7 days. 02/23/23 03/02/23 Yes Becky Augusta, NP  benzonatate (TESSALON) 100 MG capsule Take 2 capsules (200 mg total) by mouth every 8 (eight) hours. 02/23/23  Yes Becky Augusta, NP  buPROPion (WELLBUTRIN XL) 150 MG 24 hr tablet  02/20/18  Yes [provider]  clonazePAM (KLONOPIN) 0.5 MG tablet Take by mouth. 03/04/22  Yes [provider]  escitalopram (LEXAPRO) 5 MG tablet Take by mouth.  12/01/22  Yes [provider]  hydrOXYzine (ATARAX) 10 MG tablet Take by mouth. 11/11/22  Yes [provider]  ipratropium (ATROVENT) 0.06 % nasal spray Place 2 sprays into both nostrils 4 (four) times daily. 02/23/23  Yes Becky Augusta, NP  promethazine-dextromethorphan (PROMETHAZINE-DM) 6.25-15 MG/5ML syrup Take 5 mLs by mouth 4 (four) times daily as needed. 02/23/23  Yes Becky Augusta, NP  QUEtiapine Fumarate 150 MG TABS Take by mouth. 04/10/22  Yes [provider]  SUMAtriptan (IMITREX) 25 MG tablet  01/04/18  Yes [provider]  baclofen (LIORESAL) 10 MG tablet Take 1 tablet (10 mg total) by mouth 3 (three) times daily. 12/02/22   Becky Augusta, NP  clindamycin (CLEOCIN T) 1 % external solution  SMARTSIG:Sparingly Topical Twice Daily    [provider]  HUMIRA-CD/UC/HS STARTER 80 MG/0.8ML PNKT Inject into the skin. 04/25/22   [provider]  ibuprofen (ADVIL) 600 MG tablet Take 1 tablet (600 mg total) by mouth every 6 (six) hours as needed. 12/02/22   Becky Augusta, NP  naproxen (NAPROSYN) 500 MG tablet Take 1 tablet (500 mg total) by mouth 2 (two) times daily. 07/18/22   Eusebio Friendly B, PA-C  ondansetron (ZOFRAN-ODT) 4 MG disintegrating tablet Take 1 tablet (4 mg total) by mouth every 6 (six) hours as needed for nausea or vomiting. 04/07/22   Becky Augusta, NP  topiramate (TOPAMAX) 25 MG tablet Take by mouth.    [provider]  triamcinolone cream (KENALOG) 0.1 % SMARTSIG:Sparingly Topical Twice Daily PRN    [provider]  venlafaxine (EFFEXOR) 100 MG tablet TAKE 1.5 TABLETS BY MOUTH ONCE DAILY. 12/23/21   [provider]    Family History Family History  Problem Relation Age of Onset   Bipolar disorder Mother    Depression Mother    Breast cancer Mother 54   Cancer Father        Kidney   Depression Maternal Grandmother    Breast cancer Maternal Grandmother 21       contact/    Social History Social History   Tobacco Use   Smoking status: Former    Current packs/day: 0.50    Types: Cigarettes   Smokeless tobacco: Never  Vaping Use   Vaping status: Every Day  Substance Use Topics   Alcohol use: No   Drug use: No     Allergies   Cefaclor, Cefadroxil, Cefuroxime, Cephalexin, Clarithromycin, Clindamycin, Erythromycin, Sulfamethoxazole-trimethoprim, Vitamin a, Morphine, Other, Azithromycin, and Red dye #40 (allura red)   Review of Systems Review of Systems  Constitutional:  Negative for fever.  HENT:  Positive for congestion, ear pain and rhinorrhea. Negative for sore throat.   Respiratory:  Positive for cough. Negative for shortness of breath and wheezing.      Physical Exam Triage Vital Signs ED Triage Vitals   Encounter Vitals Group     BP      Systolic BP Percentile      Diastolic BP Percentile      Pulse      Resp      Temp      Temp src      SpO2      Weight      Height      Head Circumference      Peak Flow      Pain Score      Pain Loc      Pain Education      Exclude from Growth Chart  No data found.  Updated Vital Signs BP 128/89 (BP Location: Right Arm)   Pulse 86   Temp 98.4 F (36.9 C) (Oral)   Resp 19   SpO2 97%   Visual Acuity Right Eye Distance:   Left Eye Distance:   Bilateral Distance:    Right Eye Near:   Left Eye Near:    Bilateral Near:     Physical Exam Vitals and nursing note reviewed.  Constitutional:      Appearance: Normal appearance. She is not ill-appearing.  HENT:     Head: Normocephalic and atraumatic.     Right Ear: Tympanic membrane, ear canal and external ear normal. There is no impacted cerumen.     Left Ear: Ear canal and external ear normal. There is no impacted cerumen.     Ears:     Comments: Left tympanic membrane is erythematous and injected.  Right TM is pearly gray appearance.  Both EACs are clear.    Nose: Congestion and rhinorrhea present.     Comments: Patient mucosa is erythematous and edematous with clear discharge in both nares.    Mouth/Throat:     Mouth: Mucous membranes are moist.     Pharynx: Oropharynx is clear. Posterior oropharyngeal erythema present. No oropharyngeal exudate.     Comments: Mild erythema to the posterior oropharynx with clear postnasal drip. Cardiovascular:     Rate and Rhythm: Normal rate and regular rhythm.     Pulses: Normal pulses.     Heart sounds: Normal heart sounds. No murmur heard.    No friction rub. No gallop.  Pulmonary:     Effort: Pulmonary effort is normal.     Breath sounds: Normal breath sounds. No wheezing, rhonchi or rales.  Musculoskeletal:     Cervical back: Normal range of motion and neck supple. No tenderness.  Lymphadenopathy:     Cervical: No cervical adenopathy.   Skin:    General: Skin is warm and dry.     Capillary Refill: Capillary refill takes less than 2 seconds.     Findings: No rash.  Neurological:     General: No focal deficit present.     Mental Status: She is alert and oriented to person, place, and time.      UC Treatments / Results  Labs (all labs ordered are listed, but only abnormal results are displayed) Labs Reviewed - No data to display  EKG   Radiology No results found.  Procedures Procedures (including critical care time)  Medications Ordered in UC Medications - No data to display  Initial Impression / Assessment and Plan / UC Course  I have reviewed the triage vital signs and the nursing notes.  Pertinent labs & imaging results that were available during my care of the patient were reviewed by me and considered in my medical decision making (see chart for details).   Patient is a pleasant, nontoxic-appearing 32 year old female presenting for evaluation of 2 days with the respiratory symptoms as outlined HPI above.  On exam she does have evidence of otitis media on the left as she has an erythematous and injected tympanic membrane.  The right TM is pearly gray in appearance.  Her nasal mucosa is also inflamed but her rhinorrhea is clear as is her postnasal drip.  Cardiopulmonary exam is benign.  I will discharge her home with a diagnosis of URI with cough and congestion and left otitis media on Augmentin 875 twice daily for 7 days.  Tylenol and/or ibuprofen as needed  for pain.  Atrovent nasal spray to open nasal congestion and Tessalon Perles and Promethazine DM cough syrup for cough and congestion.  Return precautions reviewed.  Work note provided.   Final Clinical Impressions(s) / UC Diagnoses   Final diagnoses:  URI with cough and congestion  Non-recurrent acute suppurative otitis media of left ear without spontaneous rupture of tympanic membrane     Discharge Instructions      Take the Augmentin 875 mg  twice daily with food for 7 days for treatment of your upper respiratory infection.  Use over-the-counter Tylenol and/or ibuprofen according the package instructions as needed for any fever or pain.  Use the Atrovent nasal spray, 2 squirts in each nostril every 6 hours, as needed for runny nose and postnasal drip.  Use the Tessalon Perles every 8 hours during the day.  Take them with a small sip of water.  They may give you some numbness to the base of your tongue or a metallic taste in your mouth, this is normal.  Use the Promethazine DM cough syrup at bedtime for cough and congestion.  It will make you drowsy so do not take it during the day.  Return for reevaluation or see your primary care provider for any new or worsening symptoms.      ED Prescriptions     Medication Sig Dispense Auth. Provider   amoxicillin-clavulanate (AUGMENTIN) 875-125 MG tablet Take 1 tablet by mouth every 12 (twelve) hours for 7 days. 14 tablet Becky Augusta, NP   benzonatate (TESSALON) 100 MG capsule Take 2 capsules (200 mg total) by mouth every 8 (eight) hours. 21 capsule Becky Augusta, NP   ipratropium (ATROVENT) 0.06 % nasal spray Place 2 sprays into both nostrils 4 (four) times daily. 15 mL Becky Augusta, NP   promethazine-dextromethorphan (PROMETHAZINE-DM) 6.25-15 MG/5ML syrup Take 5 mLs by mouth 4 (four) times daily as needed. 118 mL Becky Augusta, NP      PDMP not reviewed this encounter.   Becky Augusta, NP 02/23/23 [phone]

## 2023-02-23 NOTE — Discharge Instructions (Signed)
Take the Augmentin 875 mg twice daily with food for 7 days for treatment of your upper respiratory infection.  Use over-the-counter Tylenol and/or ibuprofen according the package instructions as needed for any fever or pain.  Use the Atrovent nasal spray, 2 squirts in each nostril every 6 hours, as needed for runny nose and postnasal drip.  Use the Tessalon Perles every 8 hours during the day.  Take them with a small sip of water.  They may give you some numbness to the base of your tongue or a metallic taste in your mouth, this is normal.  Use the Promethazine DM cough syrup at bedtime for cough and congestion.  It will make you drowsy so do not take it during the day.  Return for reevaluation or see your primary care provider for any new or worsening symptoms.

## 2023-02-23 NOTE — ED Triage Notes (Signed)
Bilateral ear pain 2 days Nasal congestion Facial pain No color to mucus

## 2023-03-02 ENCOUNTER — Ambulatory Visit (INDEPENDENT_AMBULATORY_CARE_PROVIDER_SITE_OTHER): Payer: Medicaid Other

## 2023-03-02 ENCOUNTER — Ambulatory Visit
Admission: EM | Admit: 2023-03-02 | Discharge: 2023-03-02 | Disposition: A | Discharge status: Home / Self Care | Payer: Medicaid Other | Attending: Physician Assistant | Admitting: Physician Assistant

## 2023-03-02 DIAGNOSIS — R051 Acute cough: Secondary | ICD-10-CM

## 2023-03-02 DIAGNOSIS — H66002 Acute suppurative otitis media without spontaneous rupture of ear drum, left ear: Secondary | ICD-10-CM

## 2023-03-02 MED ORDER — PROMETHAZINE-DM 6.25-15 MG/5ML PO SYRP: 5.0000 mL | ORAL_SOLUTION | Freq: Four times a day (QID) | ORAL | 0 refills | Status: DC | PRN

## 2023-03-02 MED ORDER — LEVOFLOXACIN 750 MG PO TABS: 750.0000 mg | ORAL_TABLET | Freq: Every day | ORAL | 0 refills | Status: AC

## 2023-03-02 NOTE — Discharge Instructions (Addendum)
-  You continue to have an ear infection.  I sent antibiotics to the pharmacy.  This will also cover pneumonia, sinus infection.  If this does not help you then you need to see an ENT specialist. - I will call you if your chest x-ray shows pneumonia.  Otherwise I will send you message through MyChart. - I refilled your cough meds.  Increase rest and fluids.

## 2023-03-02 NOTE — ED Provider Notes (Signed)
[facility]    CSN: [id number] Arrival date & time: 03/02/23  1833      History   Chief Complaint Chief Complaint  Patient presents with   Cough   Otalgia    HPI Courtney Sanchez is a 32 y.o. female presenting for 1-1/2-week history of left-sided ear pain, cough, congestion and fatigue.  Denies fever, sore throat, sinus pain, chest pain or breathing difficulty but feels the cough is deep.  Daughter has been sick for about a month with similar symptoms.  Patient was seen here 1 week ago.  At previous visit she was diagnosed with an ear infection and placed on Augmentin 875 twice daily for 7 days.  She completed medication today but does not feel any better.  She also take Tessalon Perles and Promethazine DM which helped the cough somewhat.  No worsening of symptoms but no real improvement.  HPI  Past Medical History:  Diagnosis Date   Asthma    Endometriosis    Endometriosis    Hidradenitis    Hydronephrosis    IBS (irritable bowel syndrome)    Migraine    Migraine    Pseudotumor cerebri    Sciatica    Scoliosis     Patient Active Problem List   Diagnosis Date Noted   Family history of breast cancer 05/08/2022   Endometriosis 05/08/2022   Menorrhagia with regular cycle 05/08/2022   Symptomatic cholelithiasis    Tobacco use affecting pregnancy in third trimester, antepartum 10/03/2016   Abdominal pain 09/18/2016   Labor and delivery indication for care or intervention 08/30/2016   Pregnancy 07/03/2016   Abdominal pain affecting pregnancy 06/14/2016   Obesity complicating pregnancy, third trimester 06/11/2016   Supervision of high risk pregnancy, antepartum, third trimester 05/28/2016   History of spinal fusion 04/29/2016   BMI 40.0-44.9, adult (HCC) 04/29/2016   Acute left-sided low back pain with left-sided sciatica 03/27/2015   Idiopathic scoliosis 04/20/2012   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   Renal cyst 12/16/2011   Pseudotumor cerebri  01/16/2011   Hydronephrosis of right kidney 10/23/2010   Intermittent asthma 10/23/2010   Migraines 10/23/2010   IBS (irritable bowel syndrome) 10/23/2010    Past Surgical History:  Procedure Laterality Date   CHOLECYSTECTOMY     COLONOSCOPY  2001;2008   KIDNEY SURGERY Right 2012   per pt peice of kidney removed hand has only partial function   LAPAROSCOPY  04/2007   SPINAL FUSION     SPINAL FUSION  2008   SPINAL FUSION     TONSILLECTOMY      OB History     Gravida  1   Para  1   Term  1   Preterm      AB      Living  1      SAB      IAB      Ectopic      Multiple      Live Births  1            Home Medications    Prior to Admission medications   Medication Sig Start Date End Date Taking? Authorizing Provider  AJOVY 225 MG/1.5ML SOAJ Inject into the skin. 11/28/22  Yes [provider]  baclofen (LIORESAL) 10 MG tablet Take 1 tablet (10 mg total) by mouth 3 (three) times daily. 12/02/22  Yes Becky Augusta, NP  buPROPion (WELLBUTRIN XL) 150 MG 24 hr tablet  02/20/18  Yes [provider]  clindamycin (CLEOCIN T) 1 % external solution SMARTSIG:Sparingly Topical Twice Daily   Yes [provider]  clonazePAM (KLONOPIN) 0.5 MG tablet Take by mouth. 03/04/22  Yes [provider]  escitalopram (LEXAPRO) 5 MG tablet Take by mouth. 12/01/22  Yes [provider]  HUMIRA-CD/UC/HS STARTER 80 MG/0.8ML PNKT Inject into the skin. 04/25/22  Yes [provider]  hydrOXYzine (ATARAX) 10 MG tablet Take by mouth. 11/11/22  Yes [provider]  ibuprofen (ADVIL) 600 MG tablet Take 1 tablet (600 mg total) by mouth every 6 (six) hours as needed. 12/02/22  Yes Becky Augusta, NP  ipratropium (ATROVENT) 0.06 % nasal spray Place 2 sprays into both nostrils 4 (four) times daily. 02/23/23  Yes Becky Augusta, NP  levofloxacin (LEVAQUIN) 750 MG tablet Take 1 tablet (750 mg total) by mouth daily for 5 days. 03/02/23 03/07/23 Yes Eusebio Friendly B, PA-C  naproxen (NAPROSYN) 500 MG tablet Take 1 tablet (500 mg total) by mouth 2 (two) times daily. 07/18/22  Yes Eusebio Friendly B, PA-C  ondansetron (ZOFRAN-ODT) 4 MG disintegrating tablet Take 1 tablet (4 mg total) by mouth every 6 (six) hours as needed for nausea or vomiting. 04/07/22  Yes Becky Augusta, NP  QUEtiapine Fumarate 150 MG TABS Take by mouth. 04/10/22  Yes [provider]  SUMAtriptan (IMITREX) 25 MG tablet  01/04/18  Yes [provider]  topiramate (TOPAMAX) 25 MG tablet Take by mouth.   Yes [provider]  triamcinolone cream (KENALOG) 0.1 % SMARTSIG:Sparingly Topical Twice Daily PRN   Yes [provider]  venlafaxine (EFFEXOR) 100 MG tablet TAKE 1.5 TABLETS BY MOUTH ONCE DAILY. 12/23/21  Yes [provider]  promethazine-dextromethorphan (PROMETHAZINE-DM) 6.25-15 MG/5ML syrup Take 5 mLs by mouth 4 (four) times daily as needed. 03/02/23  Yes Shirlee Latch, PA-C    Family History Family History  Problem Relation Age of Onset   Bipolar disorder Mother    Depression Mother    Breast cancer Mother 33   Cancer Father        Kidney   Depression Maternal Grandmother    Breast cancer Maternal Grandmother 67       contact/    Social History Social History   Tobacco Use   Smoking status: Former    Current packs/day: 0.50    Types: Cigarettes   Smokeless tobacco: Never  Vaping Use   Vaping status: Every Day  Substance Use Topics   Alcohol use: No   Drug use: No     Allergies   Cefaclor, Cefadroxil, Cefuroxime, Cephalexin, Clarithromycin, Clindamycin, Erythromycin, Sulfamethoxazole-trimethoprim, Vitamin a, Morphine, Other, Azithromycin, and Red dye #40 (allura red)   Review of Systems Review of Systems  Constitutional:  Positive for fatigue. Negative for chills, diaphoresis and fever.  HENT:  Positive for congestion, ear pain and rhinorrhea. Negative for sinus pressure, sinus pain and sore throat.   Respiratory:   Positive for cough. Negative for shortness of breath and wheezing.   Cardiovascular:  Negative for chest pain.  Gastrointestinal:  Negative for abdominal pain, nausea and vomiting.  Musculoskeletal:  Negative for arthralgias and myalgias.  Skin:  Negative for rash.  Neurological:  Negative for weakness and headaches.  Hematological:  Negative for adenopathy.     Physical Exam Triage Vital Signs ED Triage Vitals  Encounter Vitals Group     BP      Systolic BP Percentile      Diastolic BP Percentile      Pulse  Resp      Temp      Temp src      SpO2      Weight      Height      Head Circumference      Peak Flow      Pain Score      Pain Loc      Pain Education      Exclude from Growth Chart    No data found.  Updated Vital Signs BP (!) 152/87 (BP Location: Left Arm)   Pulse (!) 107   Temp 98.4 F (36.9 C) (Oral)   Resp 20   SpO2 99%       Physical Exam Vitals and nursing note reviewed.  Constitutional:      General: She is not in acute distress.    Appearance: Normal appearance. She is not ill-appearing or toxic-appearing.  HENT:     Head: Normocephalic and atraumatic.     Right Ear: Ear canal and external ear normal. A middle ear effusion is present.     Left Ear: Ear canal and external ear normal. A middle ear effusion is present. Tympanic membrane is erythematous and bulging.     Nose: Congestion present.     Mouth/Throat:     Mouth: Mucous membranes are moist.     Pharynx: Oropharynx is clear.  Eyes:     General: No scleral icterus.       Right eye: No discharge.        Left eye: No discharge.     Conjunctiva/sclera: Conjunctivae normal.  Cardiovascular:     Rate and Rhythm: Regular rhythm. Tachycardia present.     Heart sounds: Normal heart sounds.  Pulmonary:     Effort: Pulmonary effort is normal. No respiratory distress.     Breath sounds: Normal breath sounds.  Musculoskeletal:     Cervical back: Neck supple.  Skin:    General: Skin is  dry.  Neurological:     General: No focal deficit present.     Mental Status: She is alert. Mental status is at baseline.     Motor: No weakness.     Gait: Gait normal.  Psychiatric:        Mood and Affect: Mood normal.        Behavior: Behavior normal.      UC Treatments / Results  Labs (all labs ordered are listed, but only abnormal results are displayed) Labs Reviewed - No data to display  EKG   Radiology No results found.  Procedures Procedures (including critical care time)  Medications Ordered in UC Medications - No data to display  Initial Impression / Assessment and Plan / UC Course  I have reviewed the triage vital signs and the nursing notes.  Pertinent labs & imaging results that were available during my care of the patient were reviewed by me and considered in my medical decision making (see chart for details).   32 year old female presents for approximately 10-day history of left-sided ear pain, nasal congestion, cough and fatigue.  Seen here 1 week ago and placed on Augmentin for left-sided ear infection.  Also given Promethazine DM and benzonatate.  Symptoms have not really improved.  Denies fever, chest pain, wheezing or shortness of breath.  On exam she has nasal congestion, erythema and bulging left TM and effusion of bilateral TMs.  Throat is clear.  Chest clear auscultation.  Will obtain chest x-ray to assess for possible underlying pneumonia.  Wet  read chest x-ray is negative.  Will contact patient regarding results if she has pneumonia but sent Levaquin for otitis media as she has a long allergy list.  She does report that she does not really have an allergy to many of the medications on the list and her mother is "a hypochondriac" who thinks she is allergic to all medicines.  She has taken amoxicillin and thinks that she has taken cephalosporins in the past and been okay but is not sure.  Refilled Promethazine DM.  Encouraged increasing rest and fluids.   Explained that if she is not feeling better after she completes the Levaquin she may need referral to ENT.  ED precautions given.  CXR overread is negative.   Final Clinical Impressions(s) / UC Diagnoses   Final diagnoses:  Acute cough  Acute suppurative otitis media of left ear without spontaneous rupture of tympanic membrane, recurrence not specified     Discharge Instructions      -You continue to have an ear infection.  I sent antibiotics to the pharmacy.  This will also cover pneumonia, sinus infection.  If this does not help you then you need to see an ENT specialist. - I will call you if your chest x-ray shows pneumonia.  Otherwise I will send you message through MyChart. - I refilled your cough meds.  Increase rest and fluids.     ED Prescriptions     Medication Sig Dispense Auth. Provider   promethazine-dextromethorphan (PROMETHAZINE-DM) 6.25-15 MG/5ML syrup Take 5 mLs by mouth 4 (four) times daily as needed. 118 mL Eusebio Friendly B, PA-C   levofloxacin (LEVAQUIN) 750 MG tablet Take 1 tablet (750 mg total) by mouth daily for 5 days. 5 tablet Gareth Morgan      PDMP not reviewed this encounter.   Shirlee Latch, PA-C 03/03/23 [phone]

## 2023-03-02 NOTE — ED Triage Notes (Signed)
Patient finish abx this morning. Still has cough and left ear pain.

## 2023-03-03 ENCOUNTER — Telehealth: Payer: Self-pay | Admitting: Physician Assistant

## 2023-03-03 MED ORDER — FLUCONAZOLE 150 MG PO TABS: ORAL_TABLET | ORAL | 0 refills | Status: DC

## 2023-03-03 NOTE — Telephone Encounter (Signed)
Sent Diflucan to pharmacy for vaginal yeast infection which patient reports developing after taking antibiotics.

## 2023-03-25 ENCOUNTER — Ambulatory Visit: Payer: Medicaid Other

## 2023-04-01 ENCOUNTER — Ambulatory Visit: Payer: Medicaid Other

## 2023-04-01 VITALS — BP 134/82 | HR 96 | Ht 64.0 in | Wt 267.0 lb

## 2023-04-01 DIAGNOSIS — Z3042 Encounter for surveillance of injectable contraceptive: Secondary | ICD-10-CM | POA: Diagnosis not present

## 2023-04-01 MED ORDER — MEDROXYPROGESTERONE ACETATE 150 MG/ML IM SUSY
150.0000 mg | PREFILLED_SYRINGE | Freq: Once | INTRAMUSCULAR | Status: AC
Start: 2023-04-01 — End: 2023-04-01
  Administered 2023-04-01: 150 mg via INTRAMUSCULAR

## 2023-04-01 NOTE — Progress Notes (Signed)
    NURSE VISIT NOTE  Subjective:    Patient ID: Courtney Sanchez, female    DOB: 03-Mar-1991, 33 y.o.   MRN: [id number]  HPI  Patient is a 33 y.o. G54P1001 female who presents for depo provera injection.   Objective:    BP 134/82   Pulse 96   Wt 267 lb (121.1 kg)   BMI 44.43 kg/m   Last Annual: 05/08/22. Last pap: 05/08/22. Last Depo-Provera: 01/07/23. Side Effects if any: none. Serum HCG indicated? No . Depo-Provera 150 mg IM given by: Beverely Pace, CMA. Site: Right Deltoid    Assessment:   1. Encounter for Depo-Provera contraception      Plan:   Next appointment due between 04/16-30    Loney Laurence, CMA

## 2023-04-22 ENCOUNTER — Ambulatory Visit
Admission: EM | Admit: 2023-04-22 | Discharge: 2023-04-22 | Disposition: A | Discharge status: Home / Self Care | Payer: Medicaid Other | Attending: Physician Assistant | Admitting: Physician Assistant

## 2023-04-22 ENCOUNTER — Ambulatory Visit (INDEPENDENT_AMBULATORY_CARE_PROVIDER_SITE_OTHER): Payer: Medicaid Other

## 2023-04-22 DIAGNOSIS — B349 Viral infection, unspecified: Secondary | ICD-10-CM | POA: Insufficient documentation

## 2023-04-22 DIAGNOSIS — R058 Other specified cough: Secondary | ICD-10-CM | POA: Insufficient documentation

## 2023-04-22 DIAGNOSIS — J45901 Unspecified asthma with (acute) exacerbation: Secondary | ICD-10-CM | POA: Insufficient documentation

## 2023-04-22 LAB — RESP PANEL BY RT-PCR (FLU A&B, COVID) ARPGX2
Influenza A by PCR: NEGATIVE
Influenza B by PCR: NEGATIVE
SARS Coronavirus 2 by RT PCR: NEGATIVE

## 2023-04-22 MED ORDER — PROMETHAZINE-DM 6.25-15 MG/5ML PO SYRP: 5.0000 mL | ORAL_SOLUTION | Freq: Four times a day (QID) | ORAL | 0 refills | Status: DC | PRN

## 2023-04-22 MED ORDER — IPRATROPIUM BROMIDE 0.06 % NA SOLN: 2.0000 | Freq: Four times a day (QID) | NASAL | 0 refills | Status: DC

## 2023-04-22 MED ORDER — PREDNISONE 20 MG PO TABS: 40.0000 mg | ORAL_TABLET | Freq: Every day | ORAL | 0 refills | Status: AC

## 2023-04-22 NOTE — ED Provider Notes (Signed)
[facility]    CSN: [id number] Arrival date & time: 04/22/23  1047      History   Chief Complaint Chief Complaint  Patient presents with   Facial Pain   Cough    HPI Courtney Sanchez is a 33 y.o. female with history of asthma presenting for 4 day history of fatigue, cough, congestion, facial pain, low grade fever up to 100 degrees, diarrhea, chest tightness and SOB.  Denies  sore throat, ear pain, vomiting or abdominal pain.  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  She has been taking Sudafed and Tylenol.  No other complaints.  HPI  Past Medical History:  Diagnosis Date   Asthma    Endometriosis    Endometriosis    Hidradenitis    Hydronephrosis    IBS (irritable bowel syndrome)    Migraine    Migraine    Pseudotumor cerebri    Sciatica    Scoliosis     Patient Active Problem List   Diagnosis Date Noted   Family history of breast cancer 05/08/2022   Endometriosis 05/08/2022   Menorrhagia with regular cycle 05/08/2022   Symptomatic cholelithiasis    Tobacco use affecting pregnancy in third trimester, antepartum 10/03/2016   Abdominal pain 09/18/2016   Labor and delivery indication for care or intervention 08/30/2016   Pregnancy 07/03/2016   Abdominal pain affecting pregnancy 06/14/2016   Obesity complicating pregnancy, third trimester 06/11/2016   Supervision of high risk pregnancy, antepartum, third trimester 05/28/2016   History of spinal fusion 04/29/2016   BMI 40.0-44.9, adult (HCC) 04/29/2016   Acute left-sided low back pain with left-sided sciatica 03/27/2015   Idiopathic scoliosis 04/20/2012   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   Renal cyst 12/16/2011   Pseudotumor cerebri 01/16/2011   Hydronephrosis of right kidney 10/23/2010   Intermittent asthma 10/23/2010   Migraines 10/23/2010   IBS (irritable bowel syndrome) 10/23/2010    Past Surgical History:  Procedure Laterality Date   CHOLECYSTECTOMY      COLONOSCOPY  2001;2008   KIDNEY SURGERY Right 2012   per pt peice of kidney removed hand has only partial function   LAPAROSCOPY  04/2007   SPINAL FUSION     SPINAL FUSION  2008   SPINAL FUSION     TONSILLECTOMY      OB History     Gravida  1   Para  1   Term  1   Preterm      AB      Living  1      SAB      IAB      Ectopic      Multiple      Live Births  1            Home Medications    Prior to Admission medications   Medication Sig Start Date End Date Taking? Authorizing Provider  ipratropium (ATROVENT) 0.06 % nasal spray Place 2 sprays into both nostrils 4 (four) times daily. 04/22/23  Yes Shirlee Latch, PA-C  predniSONE (DELTASONE) 20 MG tablet Take 2 tablets (40 mg total) by mouth daily for 5 days. 04/22/23 04/27/23 Yes Shirlee Latch, PA-C  promethazine-dextromethorphan (PROMETHAZINE-DM) 6.25-15 MG/5ML syrup Take 5 mLs by mouth 4 (four) times daily as needed. 04/22/23  Yes Eusebio Friendly B, PA-C  AJOVY 225 MG/1.5ML SOAJ Inject into the skin. 11/28/22   [provider]  baclofen (LIORESAL) 10 MG tablet Take 1  tablet (10 mg total) by mouth 3 (three) times daily. 12/02/22   Becky Augusta, NP  buPROPion (WELLBUTRIN XL) 150 MG 24 hr tablet  02/20/18   [provider]  clindamycin (CLEOCIN T) 1 % external solution SMARTSIG:Sparingly Topical Twice Daily    [provider]  clonazePAM (KLONOPIN) 0.5 MG tablet Take by mouth. 03/04/22   [provider]  escitalopram (LEXAPRO) 5 MG tablet Take by mouth. 12/01/22   [provider]  fluconazole (DIFLUCAN) 150 MG tablet Take 1 tab po q72 hr prn yeast infect 03/03/23   Shirlee Latch, PA-C  HUMIRA-CD/UC/HS STARTER 80 MG/0.8ML PNKT Inject into the skin. 04/25/22   [provider]  hydrOXYzine (ATARAX) 10 MG tablet Take by mouth. 11/11/22   [provider]  ibuprofen (ADVIL) 600 MG tablet Take 1 tablet (600 mg total) by mouth every 6 (six) hours as needed.  12/02/22   Becky Augusta, NP  naproxen (NAPROSYN) 500 MG tablet Take 1 tablet (500 mg total) by mouth 2 (two) times daily. 07/18/22   Eusebio Friendly B, PA-C  ondansetron (ZOFRAN-ODT) 4 MG disintegrating tablet Take 1 tablet (4 mg total) by mouth every 6 (six) hours as needed for nausea or vomiting. 04/07/22   Becky Augusta, NP  QUEtiapine Fumarate 150 MG TABS Take by mouth. 04/10/22   [provider]  SUMAtriptan (IMITREX) 25 MG tablet  01/04/18   [provider]  topiramate (TOPAMAX) 25 MG tablet Take by mouth.    [provider]  triamcinolone cream (KENALOG) 0.1 % SMARTSIG:Sparingly Topical Twice Daily PRN    [provider]  venlafaxine (EFFEXOR) 100 MG tablet TAKE 1.5 TABLETS BY MOUTH ONCE DAILY. 12/23/21   [provider]    Family History Family History  Problem Relation Age of Onset   Bipolar disorder Mother    Depression Mother    Breast cancer Mother 40   Cancer Father        Kidney   Depression Maternal Grandmother    Breast cancer Maternal Grandmother 71       contact/    Social History Social History   Tobacco Use   Smoking status: Former    Current packs/day: 0.50    Types: Cigarettes   Smokeless tobacco: Never  Vaping Use   Vaping status: Every Day  Substance Use Topics   Alcohol use: No   Drug use: No     Allergies   Cefaclor, Cefadroxil, Cefuroxime, Cephalexin, Clarithromycin, Clindamycin, Erythromycin, Sulfamethoxazole-trimethoprim, Vitamin a, Morphine, Other, Azithromycin, and Red dye #40 (allura red)   Review of Systems Review of Systems  Constitutional:  Positive for fatigue and fever (low grade). Negative for chills and diaphoresis.  HENT:  Positive for congestion, ear pain, rhinorrhea, sinus pressure and sinus pain. Negative for sore throat.   Respiratory:  Positive for cough, chest tightness, shortness of breath and wheezing.   Cardiovascular:  Negative for chest pain.  Gastrointestinal:  Negative for  abdominal pain, nausea and vomiting.  Musculoskeletal:  Negative for arthralgias and myalgias.  Skin:  Negative for rash.  Neurological:  Negative for weakness and headaches.  Hematological:  Negative for adenopathy.     Physical Exam Triage Vital Signs ED Triage Vitals  Encounter Vitals Group     BP      Systolic BP Percentile      Diastolic BP Percentile      Pulse      Resp      Temp  Temp src      SpO2      Weight      Height      Head Circumference      Peak Flow      Pain Score      Pain Loc      Pain Education      Exclude from Growth Chart    No data found.  Updated Vital Signs BP (!) 128/93 (BP Location: Left Arm)   Pulse 88   Temp 99 F (37.2 C) (Oral)   Resp 16   SpO2 97%       Physical Exam Vitals and nursing note reviewed.  Constitutional:      General: She is not in acute distress.    Appearance: Normal appearance. She is ill-appearing. She is not toxic-appearing.  HENT:     Head: Normocephalic and atraumatic.     Right Ear: Ear canal and external ear normal. A middle ear effusion is present.     Left Ear: Ear canal and external ear normal. A middle ear effusion is present. Tympanic membrane is erythematous.     Nose: Congestion present.     Mouth/Throat:     Mouth: Mucous membranes are moist.     Pharynx: Oropharynx is clear.  Eyes:     General: No scleral icterus.       Right eye: No discharge.        Left eye: No discharge.     Conjunctiva/sclera: Conjunctivae normal.  Cardiovascular:     Rate and Rhythm: Normal rate and regular rhythm.     Heart sounds: Normal heart sounds.  Pulmonary:     Effort: Pulmonary effort is normal. No respiratory distress.     Breath sounds: Normal breath sounds.  Musculoskeletal:     Cervical back: Neck supple.  Skin:    General: Skin is dry.  Neurological:     General: No focal deficit present.     Mental Status: She is alert. Mental status is at baseline.     Motor: No weakness.     Gait: Gait  normal.  Psychiatric:        Mood and Affect: Mood normal.        Behavior: Behavior normal.      UC Treatments / Results  Labs (all labs ordered are listed, but only abnormal results are displayed) Labs Reviewed  RESP PANEL BY RT-PCR (FLU A&B, COVID) [id number]    EKG   Radiology DG Chest 2 View Result Date: 04/22/2023 CLINICAL DATA:  Productive cough and wheezing EXAM: CHEST - 2 VIEW COMPARISON:  Chest radiograph 03/02/2023 FINDINGS: The heart size and mediastinal contours are within normal limits. Both lungs are clear. Spinal rods in place. IMPRESSION: No active cardiopulmonary disease. Electronically Signed   By: Annia Belt M.D.   On: 04/22/2023 11:46    Procedures Procedures (including critical care time)  Medications Ordered in UC Medications - No data to display  Initial Impression / Assessment and Plan / UC Course  I have reviewed the triage vital signs and the nursing notes.  Pertinent labs & imaging results that were available during my care of the patient were reviewed by me and considered in my medical decision making (see chart for details).   33 year old female presents for approximately 4 day history of sinus pain, nasal congestion, cough, low grade fever, chest tightness, SOB, and fatigue.    On exam she is ill-appearing and has nasal congestion, erythema of left  TM and effusion of bilateral TMs.  Throat is clear.  Chest clear auscultation.  Heart regular rate and rhythm.  Will obtain chest x-ray to assess for possible underlying pneumonia.  Also obtaining respiratory panel.  CXR overread is negative.  Respiratory panel negative.  Reviewed all results with patient.  She has a viral illness and asthma exacerbation.  Treating at this time with Promethazine DM, Atrovent nasal spray and prednisone.  Encouraged use of inhalers.  Reviewed typical course of illness.  Reviewed return precautions.   Final Clinical Impressions(s) / UC Diagnoses   Final diagnoses:   Productive cough  Viral illness  Exacerbation of asthma, unspecified asthma severity, unspecified whether persistent     Discharge Instructions      URI/COLD SYMPTOMS: Your exam today is consistent with a viral illness. Antibiotics are not indicated at this time. Use medications as directed, including cough syrup, nasal saline, and decongestants. Your symptoms should improve over the next few days and resolve within a couple of weeks. Increase rest and fluids. F/u if symptoms worsen or predominate such as sore throat, ear pain, productive cough, shortness of breath, or if you develop high fevers or worsening fatigue over the next several days.        ED Prescriptions     Medication Sig Dispense Auth. Provider   promethazine-dextromethorphan (PROMETHAZINE-DM) 6.25-15 MG/5ML syrup Take 5 mLs by mouth 4 (four) times daily as needed. 118 mL Eusebio Friendly B, PA-C   ipratropium (ATROVENT) 0.06 % nasal spray Place 2 sprays into both nostrils 4 (four) times daily. 15 mL Eusebio Friendly B, PA-C   predniSONE (DELTASONE) 20 MG tablet Take 2 tablets (40 mg total) by mouth daily for 5 days. 10 tablet Gareth Morgan      PDMP not reviewed this encounter.       Shirlee Latch, PA-C 04/22/23 1217

## 2023-04-22 NOTE — ED Triage Notes (Signed)
Patient presents to UC for cough, fecial pressure, low grade fever, rattling in chest since Saturday. Hx of asthma. Treating symptoms with sudafed and tylenol.

## 2023-04-22 NOTE — Discharge Instructions (Signed)
 URI/COLD SYMPTOMS: Your exam today is consistent with a viral illness. Antibiotics are not indicated at this time. Use medications as directed, including cough syrup, nasal saline, and decongestants. Your symptoms should improve over the next few days and resolve within a couple of weeks. Increase rest and fluids. F/u if symptoms worsen or predominate such as sore throat, ear pain, productive cough, shortness of breath, or if you develop high fevers or worsening fatigue over the next several days.

## 2023-06-18 ENCOUNTER — Ambulatory Visit: Payer: Medicaid Other

## 2023-06-23 ENCOUNTER — Ambulatory Visit

## 2023-07-08 ENCOUNTER — Ambulatory Visit
Admission: EM | Admit: 2023-07-08 | Discharge: 2023-07-08 | Disposition: A | Discharge status: Home / Self Care | Attending: Family Medicine | Admitting: Family Medicine

## 2023-07-08 ENCOUNTER — Encounter: Payer: Self-pay | Admitting: Emergency Medicine

## 2023-07-08 DIAGNOSIS — R404 Transient alteration of awareness: Secondary | ICD-10-CM

## 2023-07-08 DIAGNOSIS — R41840 Attention and concentration deficit: Secondary | ICD-10-CM

## 2023-07-08 NOTE — Discharge Instructions (Addendum)
 Reach out to both your neurologist and your behavioral health provider for follow up.  If you have confusion, ongoing headache, trouble speaking or breathing, go to the hospital emergency department.

## 2023-07-08 NOTE — ED Provider Notes (Signed)
 [facility]    CSN: [id number] Arrival date & time: 07/08/23  1341      History   Chief Complaint Chief Complaint  Patient presents with   Memory Loss    HPI Courtney Sanchez is a 33 y.o. female.   HPI   Courtney Sanchez presents for staring and zoning out for the past 3 days.  She opened up eyes in the shower but forgot what she was doing. She zones out unsure where she is going and what she is doing. Has slight headache.  Has hx of migraines, anxiety and panic attacks. No medication changes.  I feel spacy."  Has cough and taking Mucinex  NightShift. She is having trouble going to sleep.   Episodes occur several times a day and last a couple seconds to a few minutes. Her daughter has history of BRE (brief rolandic epilepsy).    Fever : no  Chills: no Sore throat: no   Cough: yes  Sputum: no Nasal congestion: no Rhinorrhea: no Myalgias: no Appetite: normal  Hydration: normal  Abdominal pain: no Nausea: no Vomiting: no Diarrhea: no new  Sleep disturbance: no  Headache:yes     Past Medical History:  Diagnosis Date   Asthma    Endometriosis    Endometriosis    Hidradenitis    Hydronephrosis    IBS (irritable bowel syndrome)    Migraine    Migraine    Pseudotumor cerebri    Sciatica    Scoliosis     Patient Active Problem List   Diagnosis Date Noted   Family history of breast cancer 05/08/2022   Endometriosis 05/08/2022   Menorrhagia with regular cycle 05/08/2022   Symptomatic cholelithiasis    Tobacco use affecting pregnancy in third trimester, antepartum 10/03/2016   Abdominal pain 09/18/2016   Labor and delivery indication for care or intervention 08/30/2016   Pregnancy 07/03/2016   Abdominal pain affecting pregnancy 06/14/2016   Obesity complicating pregnancy, third trimester 06/11/2016   Supervision of high risk pregnancy, antepartum, third trimester 05/28/2016   History of spinal fusion 04/29/2016   BMI 40.0-44.9, adult (HCC)  04/29/2016   Acute left-sided low back pain with left-sided sciatica 03/27/2015   Idiopathic scoliosis 04/20/2012   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   Renal cyst 12/16/2011   Pseudotumor cerebri 01/16/2011   Hydronephrosis of right kidney 10/23/2010   Intermittent asthma 10/23/2010   Migraines 10/23/2010   IBS (irritable bowel syndrome) 10/23/2010    Past Surgical History:  Procedure Laterality Date   CHOLECYSTECTOMY     COLONOSCOPY  2001;2008   KIDNEY SURGERY Right 2012   per pt peice of kidney removed hand has only partial function   LAPAROSCOPY  04/2007   SPINAL FUSION     SPINAL FUSION  2008   SPINAL FUSION     TONSILLECTOMY      OB History     Gravida  1   Para  1   Term  1   Preterm      AB      Living  1      SAB      IAB      Ectopic      Multiple      Live Births  1            Home Medications    Prior to Admission medications   Medication Sig Start Date End Date Taking? Authorizing Provider  AJOVY 225 MG/1.5ML SOAJ Inject into the skin. 11/28/22  Yes [provider]  clonazePAM  (KLONOPIN ) 0.5 MG tablet Take by mouth. 03/04/22  Yes [provider]  escitalopram (LEXAPRO) 5 MG tablet Take by mouth. 12/01/22  Yes [provider]  hydrOXYzine (ATARAX) 10 MG tablet Take by mouth. 11/11/22  Yes [provider]  QUEtiapine Fumarate 150 MG TABS Take by mouth. 04/10/22  Yes [provider]  SUMAtriptan (IMITREX) 25 MG tablet  01/04/18  Yes [provider]  topiramate (TOPAMAX) 25 MG tablet Take by mouth.   Yes [provider]  baclofen  (LIORESAL ) 10 MG tablet Take 1 tablet (10 mg total) by mouth 3 (three) times daily. 12/02/22   Kent Pear, NP  clindamycin (CLEOCIN T) 1 % external solution SMARTSIG:Sparingly Topical Twice Daily    [provider]  HUMIRA-CD/UC/HS STARTER 80 MG/0.8ML PNKT Inject into the skin. 04/25/22   [provider]  ibuprofen  (ADVIL ) 600 MG tablet  Take 1 tablet (600 mg total) by mouth every 6 (six) hours as needed. 12/02/22   Kent Pear, NP  ipratropium (ATROVENT ) 0.06 % nasal spray Place 2 sprays into both nostrils 4 (four) times daily. 04/22/23   Floydene Hy, PA-C  naproxen  (NAPROSYN ) 500 MG tablet Take 1 tablet (500 mg total) by mouth 2 (two) times daily. 07/18/22   Floydene Hy, PA-C  triamcinolone  cream (KENALOG ) 0.1 % SMARTSIG:Sparingly Topical Twice Daily PRN    [provider]  venlafaxine (EFFEXOR) 100 MG tablet TAKE 1.5 TABLETS BY MOUTH ONCE DAILY. 12/23/21   [provider]    Family History Family History  Problem Relation Age of Onset   Bipolar disorder Mother    Depression Mother    Breast cancer Mother 45   Cancer Father        Kidney   Depression Maternal Grandmother    Breast cancer Maternal Grandmother 51       contact/    Social History Social History   Tobacco Use   Smoking status: Former    Current packs/day: 0.50    Types: Cigarettes   Smokeless tobacco: Never  Vaping Use   Vaping status: Every Day  Substance Use Topics   Alcohol use: No   Drug use: No     Allergies   Cefaclor, Cefadroxil, Cefuroxime, Cephalexin, Clarithromycin, Clindamycin, Erythromycin, Sulfamethoxazole-trimethoprim, Vitamin a, Morphine, Other, Azithromycin , and Red dye #40 (allura red)   Review of Systems Review of Systems: negative unless otherwise stated in HPI.      Physical Exam Triage Vital Signs ED Triage Vitals  Encounter Vitals Group     BP 07/08/23 1400 134/85     Systolic BP Percentile --      Diastolic BP Percentile --      Pulse Rate 07/08/23 1400 92     Resp 07/08/23 1400 18     Temp 07/08/23 1400 99 F (37.2 C)     Temp Source 07/08/23 1400 Oral     SpO2 07/08/23 1400 98 %     Weight --      Height --      Head Circumference --      Peak Flow --      Pain Score 07/08/23 1351 0     Pain Loc --      Pain Education --      Exclude from Growth Chart --    No data  found.  Updated Vital Signs BP 134/85 (BP Location: Left Arm)   Pulse 92   Temp 99 F (37.2 C) (  Oral)   Resp 18   SpO2 98%   Visual Acuity Right Eye Distance:   Left Eye Distance:   Bilateral Distance:    Right Eye Near:   Left Eye Near:    Bilateral Near:     Physical Exam GEN:     alert, cooperative and no distress    HENT:  mucus membranes moist, oropharyngeal without lesions or erythema,  nares patent, no nasal discharge  EYES:   pupils equal and reactive, EOM intact NECK:  supple, normal ROM RESP:  clear to auscultation bilaterally, no increased work of breathing  CVS:   regular rate and rhythm, no murmur, distal pulses intact   NEURO:  alert, oriented, speech normal, CN 2-12 grossly intact, no facial droop,  sensation grossly intact, strength 5/5 bilateral UE and LE, normal coordination Skin:   warm and dry, no rash Psych: Normal affect, appropriate speech and behavior      UC Treatments / Results  Labs (all labs ordered are listed, but only abnormal results are displayed) Labs Reviewed - No data to display  EKG  If EKG performed, see my interpretation in the MDM section  Radiology No results found.   Procedures Procedures (including critical care time)  Medications Ordered in UC Medications - No data to display  Initial Impression / Assessment and Plan / UC Course  I have reviewed the triage vital signs and the nursing notes.  Pertinent labs & imaging results that were available during my care of the patient were reviewed by me and considered in my medical decision making (see chart for details).       Patient is a 33 y.o. female  who has IBS, pseudotumor cerebri, panic disorder, anxiety presents for staring episodes and difficulty concentrating.  Overall patient is nontoxic-appearing and afebrile.  Vital signs stable.  Neurological exam is unremarkable.  Patient on several cognitive altering medications.   Takes Topomax, Seroquel, Atarax PRN,  Lexapro, Klonipin PRN. Advised her to follow-up with her neurologist and behavioral health provider to see if she needs any medication adjustments.  She does have a slight headache.  There is family history of benign rolandic epilepsy which is considered benign childhood seizures.  Patient has not had any seizure-like activity.  Given ED precautions and she voiced understanding.  ED and return precautions given and patient/guardian voiced understanding. Discussed MDM, treatment plan and plan for follow-up with patient who agrees with plan.     Final Clinical Impressions(s) / UC Diagnoses   Final diagnoses:  Episodes of staring  Difficulty concentrating     Discharge Instructions      Reach out to both your neurologist and your behavioral health provider for follow up.  If you have confusion, ongoing headache, trouble speaking or breathing, go to the hospital emergency department.      ED Prescriptions   None    PDMP not reviewed this encounter.   Fidel Huddle, DO 07/08/23 1713

## 2023-07-08 NOTE — ED Triage Notes (Signed)
 Pt states for the past 3 days her head feels heavy and out of it. She zones out and forgets what she is doing. Today she was in the shower and when she opened her eyes she didn't remember herself being in the shower.

## 2023-07-30 ENCOUNTER — Ambulatory Visit
Admission: EM | Admit: 2023-07-30 | Discharge: 2023-07-30 | Disposition: A | Discharge status: Home / Self Care | Payer: Self-pay | Attending: Emergency Medicine | Admitting: Emergency Medicine

## 2023-07-30 DIAGNOSIS — M5432 Sciatica, left side: Secondary | ICD-10-CM | POA: Insufficient documentation

## 2023-07-30 DIAGNOSIS — R030 Elevated blood-pressure reading, without diagnosis of hypertension: Secondary | ICD-10-CM | POA: Insufficient documentation

## 2023-07-30 DIAGNOSIS — J069 Acute upper respiratory infection, unspecified: Secondary | ICD-10-CM

## 2023-07-30 LAB — RESP PANEL BY RT-PCR (FLU A&B, COVID) ARPGX2
Influenza A by PCR: NEGATIVE
Influenza B by PCR: NEGATIVE
SARS Coronavirus 2 by RT PCR: NEGATIVE

## 2023-07-30 LAB — GROUP A STREP BY PCR: Group A Strep by PCR: NOT DETECTED

## 2023-07-30 MED ORDER — ACETAMINOPHEN 325 MG PO TABS: 975.0000 mg | ORAL_TABLET | Freq: Once | ORAL | Status: DC

## 2023-07-30 MED ORDER — ONDANSETRON 4 MG PO TBDP
4.0000 mg | ORAL_TABLET | Freq: Once | ORAL | Status: AC
  Administered 2023-07-30: 4 mg via ORAL

## 2023-07-30 NOTE — Discharge Instructions (Addendum)
 Your strep,covid and flu are all negative Please take tylenol  as label directed for fever Take your Zofran  or Compazine  as prescribed Push fluids, avoid caffeine If you have new or worsening symptoms go to the emergency room for further evaluation

## 2023-07-30 NOTE — ED Provider Notes (Signed)
 [facility]    CSN: [id number] Arrival date & time: 07/30/23  1834      History   Chief Complaint Chief Complaint  Patient presents with   Sore Throat   Headache   Chills    HPI Courtney Sanchez is a 33 y.o. female.   33 year old female, Courtney Sanchez, presents to urgent care for evaluation of sore throat, chills, body aches, diarrhea and headaches that started today.  Patient has tried Mucinex  without relief.  Patient has not take any Tylenol  or ibuprofen  for symptom management.    Patient also complaining of low back pain with left-sided sciatica, patient has history of same, patient denies any fall or injury, denies any saddle numbness or loss of bowel and bladder.  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 No language interpreter was used.    Past Medical History:  Diagnosis Date   Asthma    Endometriosis    Endometriosis    Hidradenitis    Hydronephrosis    IBS (irritable bowel syndrome)    Migraine    Migraine    Pseudotumor cerebri    Sciatica    Scoliosis     Patient Active Problem List   Diagnosis Date Noted   Viral URI 07/30/2023   Elevated blood pressure reading 07/30/2023   Family history of breast cancer 05/08/2022   Endometriosis 05/08/2022   Menorrhagia with regular cycle 05/08/2022   Symptomatic cholelithiasis    Tobacco use affecting pregnancy in third trimester, antepartum 10/03/2016   Abdominal pain 09/18/2016   Labor and delivery indication for care or intervention 08/30/2016   Pregnancy 07/03/2016   Abdominal pain affecting pregnancy 06/14/2016   Obesity complicating pregnancy, third trimester 06/11/2016   Supervision of high risk pregnancy, antepartum, third trimester 05/28/2016   History of spinal fusion 04/29/2016   BMI 40.0-44.9, adult (HCC) 04/29/2016    Back pain with left-sided sciatica 03/27/2015   Idiopathic scoliosis 04/20/2012   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   Renal cyst 12/16/2011   Pseudotumor cerebri 01/16/2011   Hydronephrosis of right kidney 10/23/2010   Intermittent asthma 10/23/2010   Migraines 10/23/2010   IBS (irritable bowel syndrome) 10/23/2010    Past Surgical History:  Procedure Laterality Date   CHOLECYSTECTOMY     COLONOSCOPY  2001;2008   KIDNEY SURGERY Right 2012   per pt peice of kidney removed hand has only partial function   LAPAROSCOPY  04/2007   SPINAL FUSION     SPINAL FUSION  2008   SPINAL FUSION     TONSILLECTOMY      OB History     Gravida  1   Para  1   Term  1   Preterm      AB      Living  1      SAB      IAB      Ectopic      Multiple      Live Births  1            Home Medications    Prior to Admission medications   Medication Sig Start Date End Date Taking? Authorizing Provider  AJOVY 225 MG/1.5ML SOAJ Inject into the skin. 11/28/22   [provider]  baclofen  (LIORESAL ) 10 MG tablet Take 1 tablet (10 mg total) by mouth 3 (three) times  daily. 12/02/22   Kent Pear, NP  clindamycin (CLEOCIN T) 1 % external solution SMARTSIG:Sparingly Topical Twice Daily    [provider]  clonazePAM  (KLONOPIN ) 0.5 MG tablet Take by mouth. 03/04/22   [provider]  escitalopram (LEXAPRO) 5 MG tablet Take by mouth. 12/01/22   [provider]  HUMIRA-CD/UC/HS STARTER 80 MG/0.8ML PNKT Inject into the skin. 04/25/22   [provider]  hydrOXYzine (ATARAX) 10 MG tablet Take by mouth. 11/11/22   [provider]  ibuprofen  (ADVIL ) 600 MG tablet Take 1 tablet (600 mg total) by mouth every 6 (six) hours as needed. 12/02/22   Kent Pear, NP  ipratropium (ATROVENT ) 0.06 % nasal spray Place 2 sprays into both nostrils 4 (four) times daily. 04/22/23   Floydene Hy, PA-C  naproxen  (NAPROSYN ) 500 MG tablet Take 1 tablet (500 mg  total) by mouth 2 (two) times daily. 07/18/22   Nancy Axon B, PA-C  QUEtiapine Fumarate 150 MG TABS Take by mouth. 04/10/22   [provider]  SUMAtriptan (IMITREX) 25 MG tablet  01/04/18   [provider]  topiramate (TOPAMAX) 25 MG tablet Take by mouth.    [provider]  triamcinolone  cream (KENALOG ) 0.1 % SMARTSIG:Sparingly Topical Twice Daily PRN    [provider]  venlafaxine (EFFEXOR) 100 MG tablet TAKE 1.5 TABLETS BY MOUTH ONCE DAILY. 12/23/21   [provider]    Family History Family History  Problem Relation Age of Onset   Bipolar disorder Mother    Depression Mother    Breast cancer Mother 80   Cancer Father        Kidney   Depression Maternal Grandmother    Breast cancer Maternal Grandmother 100       contact/    Social History Social History   Tobacco Use   Smoking status: Former    Current packs/day: 0.50    Types: Cigarettes   Smokeless tobacco: Never  Vaping Use   Vaping status: Every Day  Substance Use Topics   Alcohol use: No   Drug use: No     Allergies   Cefaclor, Cefadroxil, Cefuroxime, Cephalexin, Clarithromycin, Clindamycin, Erythromycin, Penicillins, Sulfamethoxazole-trimethoprim, Vitamin a, Morphine, Other, Azithromycin , and Red dye #40 (allura red)   Review of Systems Review of Systems  Constitutional:  Positive for chills and fever.  HENT:  Positive for sore throat.   Gastrointestinal:  Positive for diarrhea, nausea and vomiting.  Musculoskeletal:  Positive for myalgias.  Neurological:  Positive for headaches.  All other systems reviewed and are negative.    Physical Exam Triage Vital Signs ED Triage Vitals  Encounter Vitals Group     BP 07/30/23 1922 (!) 172/108     Systolic BP Percentile --      Diastolic BP Percentile --      Pulse Rate 07/30/23 1922 99     Resp 07/30/23 1922 16     Temp 07/30/23 1922 (!) 101.5 F (38.6 C)     Temp Source 07/30/23 1922 Oral     SpO2 07/30/23  1922 97 %     Weight 07/30/23 1917 280 lb (127 kg)     Height 07/30/23 1917 5\' 5"  (1.651 m)     Head Circumference --      Peak Flow --      Pain Score 07/30/23 1925 7     Pain Loc --      Pain Education --      Exclude from Growth Chart --  No data found.  Updated Vital Signs BP (!) 172/108 (BP Location: Left Wrist)   Pulse 99   Temp (!) 101.5 F (38.6 C) (Oral)   Resp 16   Ht 5\' 5"  (1.651 m)   Wt 280 lb (127 kg)   SpO2 97%   BMI 46.59 kg/m   Visual Acuity Right Eye Distance:   Left Eye Distance:   Bilateral Distance:    Right Eye Near:   Left Eye Near:    Bilateral Near:     Physical Exam Vitals and nursing note reviewed.  Constitutional:      Appearance: She is well-developed. She is obese.  HENT:     Head: Normocephalic.     Right Ear: Tympanic membrane is retracted.     Left Ear: Tympanic membrane is retracted.     Nose: Congestion present.     Mouth/Throat:     Lips: Pink.     Mouth: Mucous membranes are moist.     Pharynx: Uvula midline. Posterior oropharyngeal erythema present.  Eyes:     Pupils: Pupils are equal, round, and reactive to light.  Cardiovascular:     Rate and Rhythm: Normal rate and regular rhythm.     Heart sounds: Normal heart sounds.  Pulmonary:     Effort: Pulmonary effort is normal.     Breath sounds: Normal breath sounds and air entry.  Neurological:     General: No focal deficit present.     Mental Status: She is alert and oriented to person, place, and time.     GCS: GCS eye subscore is 4. GCS verbal subscore is 5. GCS motor subscore is 6.     Cranial Nerves: No cranial nerve deficit.     Sensory: No sensory deficit.  Psychiatric:        Attention and Perception: Attention normal.        Mood and Affect: Mood normal.        Speech: Speech normal.        Behavior: Behavior normal.      UC Treatments / Results  Labs (all labs ordered are listed, but only abnormal results are displayed) Labs Reviewed  GROUP A STREP  BY PCR  RESP PANEL BY RT-PCR (FLU A&B, COVID) [id number]    EKG   Radiology No results found.  Procedures Procedures (including critical care time)  Medications Ordered in UC Medications  ondansetron  (ZOFRAN -ODT) disintegrating tablet 4 mg (4 mg Oral Given 07/30/23 2005)    Initial Impression / Assessment and Plan / UC Course  I have reviewed the triage vital signs and the nursing notes.  Pertinent labs & imaging results that were available during my care of the patient were reviewed by me and considered in my medical decision making (see chart for details).  Clinical Course as of 07/30/23 2138  Thu Jul 30, 2023  2005 Patient ordered Tylenol  for fever however patient started vomiting, Zofran  given. [JD]    Clinical Course User Index [JD] Patches Mcdonnell, Eveleen Hinds, NP   Discussed exam findings: negative COVID flu and strep,  and plan of care with patient, patient requesting shot of Toradol , advised patient would not give her shot of Toradol  due to her blood pressure at present, likely her headache is due to her viral illness and fever , strict go to ER precautions given , offered patient Phenergan  suppositories or Zofran ,  patient states she has Zofran  and Compazine  at home, patient requesting work note.  Work note given.   Ddx:  Viral URI, chronic back pain with left-sided sciatica , elevated blood pressure reading Final Clinical Impressions(s) / UC Diagnoses   Final diagnoses:  Viral URI  Elevated blood pressure reading  Back pain with left-sided sciatica     Discharge Instructions      Your strep,covid and flu are all negative Please take tylenol  as label directed for fever Take your Zofran  or Compazine  as prescribed Push fluids, avoid caffeine If you have new or worsening symptoms go to the emergency room for further evaluation  ED Prescriptions   None    PDMP not reviewed this encounter.   Peter Brands, NP 07/30/23 2138

## 2023-07-30 NOTE — ED Triage Notes (Signed)
 Pt c/o sore throat,chills,bodyaches & HA since today. Has tried mucinex  w/o relief.

## 2023-08-09 ENCOUNTER — Encounter: Payer: Self-pay | Admitting: Emergency Medicine

## 2023-08-09 ENCOUNTER — Ambulatory Visit
Admission: EM | Admit: 2023-08-09 | Discharge: 2023-08-09 | Disposition: A | Discharge status: Home / Self Care | Attending: Physician Assistant | Admitting: Physician Assistant

## 2023-08-09 ENCOUNTER — Ambulatory Visit

## 2023-08-09 DIAGNOSIS — J45901 Unspecified asthma with (acute) exacerbation: Secondary | ICD-10-CM

## 2023-08-09 DIAGNOSIS — R051 Acute cough: Secondary | ICD-10-CM

## 2023-08-09 DIAGNOSIS — J9801 Acute bronchospasm: Secondary | ICD-10-CM

## 2023-08-09 MED ORDER — IPRATROPIUM-ALBUTEROL 0.5-2.5 (3) MG/3ML IN SOLN
3.0000 mL | Freq: Once | RESPIRATORY_TRACT | Status: AC
  Administered 2023-08-09: 3 mL via RESPIRATORY_TRACT

## 2023-08-09 MED ORDER — PREDNISONE 10 MG PO TABS: ORAL_TABLET | ORAL | 0 refills | Status: DC

## 2023-08-09 MED ORDER — IPRATROPIUM-ALBUTEROL 0.5-2.5 (3) MG/3ML IN SOLN: 3.0000 mL | Freq: Four times a day (QID) | RESPIRATORY_TRACT | 0 refills | Status: DC | PRN

## 2023-08-09 NOTE — ED Triage Notes (Signed)
 Patient was seen here on 07/30/23 for URI symptoms.  Patient reports ongoing cough and chest congestion that has not improved.

## 2023-08-09 NOTE — Discharge Instructions (Signed)
-  Chest xray negative for pneumonia.  -Presentation consistent with continued asthma exacerbation and bronchitis.  Supportive care encouraged.  Sent prednisone  taper to pharmacy.  Continue albuterol  and Promethazine  DM.  Encouraged increased rest and fluids.  Nebulizer solution script written.  Advised nebs instead of inhaler is preferred while at home.  Reviewed returning for fever, worsening cough, increased chest discomfort.

## 2023-08-09 NOTE — ED Provider Notes (Signed)
 [facility]    CSN: [id number] Arrival date & time: 08/09/23  1329      History   Chief Complaint Chief Complaint  Patient presents with   Cough    HPI Courtney Sanchez is a 33 y.o. female with history of asthma, migraines, hidradenitis, pseudotumor cerebri, endometriosis and former tobacco abuse presenting for 10-day history of illness.  Patient has had cough, congestion, wheezing and shortness of breath.  Patient was initially seen on 07/30/2023 for URI symptoms.  Had a negative respiratory panel and negative strep test.  She went to another urgent care on 08/01/2023 and was prescribed albuterol , prednisone , doxycycline  and Flonase at that time.  She followed up with her PCP on 08/06/2023 and was prescribed Promethazine  DM and benzonatate  as well as Atrovent  nasal spray.  Patient says when she took prednisone  it helped but when she completed the course and felt like she was "going back downhill."  She has not on any fevers.  Reports increased nasal congestion and productive cough.  Patient reports using daily inhaler and albuterol  as needed.  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  Says she cannot keep missing work due to being ill.  HPI  Past Medical History:  Diagnosis Date   Asthma    Endometriosis    Endometriosis    Hidradenitis    Hydronephrosis    IBS (irritable bowel syndrome)    Migraine    Migraine    Pseudotumor cerebri    Sciatica    Scoliosis     Patient Active Problem List   Diagnosis Date Noted   Viral URI 07/30/2023   Elevated blood pressure reading 07/30/2023   Family history of breast cancer 05/08/2022   Endometriosis 05/08/2022   Menorrhagia with regular cycle 05/08/2022   Symptomatic cholelithiasis    Tobacco use affecting pregnancy in third trimester, antepartum 10/03/2016   Abdominal pain 09/18/2016   Labor and delivery indication for care or intervention 08/30/2016   Pregnancy 07/03/2016   Abdominal pain affecting pregnancy  06/14/2016   Obesity complicating pregnancy, third trimester 06/11/2016   Supervision of high risk pregnancy, antepartum, third trimester 05/28/2016   History of spinal fusion 04/29/2016   BMI 40.0-44.9, adult (HCC) 04/29/2016   Back pain with left-sided sciatica 03/27/2015   Idiopathic scoliosis 04/20/2012   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   Renal cyst 12/16/2011   Pseudotumor cerebri 01/16/2011   Hydronephrosis of right kidney 10/23/2010   Intermittent asthma 10/23/2010   Migraines 10/23/2010   IBS (irritable bowel syndrome) 10/23/2010    Past Surgical History:  Procedure Laterality Date   CHOLECYSTECTOMY     COLONOSCOPY  2001;2008   KIDNEY SURGERY Right 2012   per pt peice of kidney removed hand has only partial function   LAPAROSCOPY  04/2007   SPINAL FUSION     SPINAL FUSION  2008   SPINAL FUSION     TONSILLECTOMY      OB History     Gravida  1   Para  1   Term  1   Preterm      AB      Living  1      SAB      IAB      Ectopic      Multiple      Live Births  1            Home Medications    Prior to Admission medications   Medication Sig Start Date End  Date Taking? Authorizing Provider  ipratropium-albuterol  (DUONEB) 0.5-2.5 (3) MG/3ML SOLN Take 3 mLs by nebulization every 6 (six) hours as needed. 08/09/23  Yes Floydene Hy, PA-C  predniSONE  (DELTASONE ) 10 MG tablet Take 5 tabs po x 2 days, 4 tabs x 2 days, 3 tabs x 2 days, 2 tabs x 2 days, 1 tab x 2 days 08/09/23  Yes Nancy Axon B, PA-C  AJOVY 225 MG/1.5ML SOAJ Inject into the skin. 11/28/22   [provider]  clindamycin (CLEOCIN T) 1 % external solution SMARTSIG:Sparingly Topical Twice Daily    [provider]  clonazePAM  (KLONOPIN ) 0.5 MG tablet Take by mouth. 03/04/22   [provider]  escitalopram (LEXAPRO) 5 MG tablet Take by mouth. 12/01/22   [provider]  HUMIRA-CD/UC/HS STARTER 80 MG/0.8ML PNKT Inject into the skin. 04/25/22   [provider]  hydrOXYzine (ATARAX) 10 MG tablet Take by mouth. 11/11/22   [provider]  ibuprofen  (ADVIL ) 600 MG tablet Take 1 tablet (600 mg total) by mouth every 6 (six) hours as needed. 12/02/22   Kent Pear, NP  ipratropium (ATROVENT ) 0.06 % nasal spray Place 2 sprays into both nostrils 4 (four) times daily. 04/22/23   Nancy Axon B, PA-C  naproxen  (NAPROSYN ) 500 MG tablet Take 1 tablet (500 mg total) by mouth 2 (two) times daily. 07/18/22   Nancy Axon B, PA-C  QUEtiapine Fumarate 150 MG TABS Take by mouth. 04/10/22   [provider]  SUMAtriptan (IMITREX) 25 MG tablet  01/04/18   [provider]  topiramate (TOPAMAX) 25 MG tablet Take by mouth.    [provider]  triamcinolone  cream (KENALOG ) 0.1 % SMARTSIG:Sparingly Topical Twice Daily PRN    [provider]  venlafaxine (EFFEXOR) 100 MG tablet TAKE 1.5 TABLETS BY MOUTH ONCE DAILY. 12/23/21   [provider]    Family History Family History  Problem Relation Age of Onset   Bipolar disorder Mother    Depression Mother    Breast cancer Mother 17   Cancer Father        Kidney   Depression Maternal Grandmother    Breast cancer Maternal Grandmother 9       contact/    Social History Social History   Tobacco Use   Smoking status: Former    Current packs/day: 0.50    Types: Cigarettes   Smokeless tobacco: Never  Vaping Use   Vaping status: Every Day  Substance Use Topics   Alcohol use: No   Drug use: No     Allergies   Cefaclor, Cefadroxil, Cefuroxime, Cephalexin, Clarithromycin, Clindamycin, Erythromycin, Penicillins, Sulfamethoxazole-trimethoprim, Vitamin a, Morphine, Other, Azithromycin , and Red dye #40 (allura red)   Review of Systems Review of Systems  Constitutional:  Positive for fatigue. Negative for chills, diaphoresis and fever.  HENT:  Positive for congestion and rhinorrhea. Negative for ear pain, sinus pressure, sinus pain and sore throat.    Respiratory:  Positive for cough, chest tightness, shortness of breath and wheezing.   Cardiovascular:  Negative for chest pain.  Gastrointestinal:  Negative for abdominal pain, nausea and vomiting.  Musculoskeletal:  Negative for arthralgias and myalgias.  Skin:  Negative for rash.  Neurological:  Negative for weakness and headaches.     Physical Exam Triage Vital Signs ED Triage Vitals  Encounter Vitals Group     BP 08/09/23 1340 (!) 159/123     Systolic BP Percentile --      Diastolic BP Percentile --  Pulse Rate 08/09/23 1340 100     Resp 08/09/23 1340 16     Temp 08/09/23 1340 98.5 F (36.9 C)     Temp Source 08/09/23 1340 Oral     SpO2 08/09/23 1340 95 %     Weight 08/09/23 1338 279 lb 15.8 oz (127 kg)     Height 08/09/23 1338 5\' 5"  (1.651 m)     Head Circumference --      Peak Flow --      Pain Score 08/09/23 1337 4     Pain Loc --      Pain Education --      Exclude from Growth Chart --    No data found.  Updated Vital Signs BP (!) 159/123 (BP Location: Left Arm)   Pulse 100   Temp 98.5 F (36.9 C) (Oral)   Resp 16   Ht 5\' 5"  (1.651 m)   Wt 279 lb 15.8 oz (127 kg)   SpO2 95%   BMI 46.59 kg/m   Physical Exam Vitals and nursing note reviewed.  Constitutional:      General: She is not in acute distress.    Appearance: Normal appearance. She is ill-appearing. She is not toxic-appearing.  HENT:     Head: Normocephalic and atraumatic.     Nose: Congestion present.     Mouth/Throat:     Mouth: Mucous membranes are moist.     Pharynx: Oropharynx is clear.  Eyes:     General: No scleral icterus.       Right eye: No discharge.        Left eye: No discharge.     Conjunctiva/sclera: Conjunctivae normal.  Cardiovascular:     Rate and Rhythm: Normal rate and regular rhythm.     Heart sounds: Normal heart sounds.  Pulmonary:     Effort: Pulmonary effort is normal. No respiratory distress.     Breath sounds: Wheezing (few scattered. Reduced breath  sounds throughout and frequent coughing) present.  Musculoskeletal:     Cervical back: Neck supple.  Skin:    General: Skin is dry.  Neurological:     General: No focal deficit present.     Mental Status: She is alert. Mental status is at baseline.     Motor: No weakness.     Gait: Gait normal.  Psychiatric:        Mood and Affect: Mood normal.        Behavior: Behavior normal.      UC Treatments / Results  Labs (all labs ordered are listed, but only abnormal results are displayed) Labs Reviewed - No data to display  EKG   Radiology DG Chest 2 View Result Date: 08/09/2023 CLINICAL DATA:  Shortness of breath. EXAM: CHEST - 2 VIEW COMPARISON:  Chest radiograph dated 04/22/2023. FINDINGS: No focal consolidation, pleural effusion, or pneumothorax. The cardiac silhouette is within normal limits. No acute osseous pathology. Posterior spinal fusion hardware. IMPRESSION: No active cardiopulmonary disease. Electronically Signed   By: Angus Bark M.D.   On: 08/09/2023 13:51    Procedures Procedures (including critical care time)  Medications Ordered in UC Medications  ipratropium-albuterol  (DUONEB) 0.5-2.5 (3) MG/3ML nebulizer solution 3 mL (3 mLs Nebulization Given 08/09/23 1419)    Initial Impression / Assessment and Plan / UC Course  I have reviewed the triage vital signs and the nursing notes.  Pertinent labs & imaging results that were available during my care of the patient were reviewed by me and considered  in my medical decision making (see chart for details).   33 year old female presents for 10-day history of cough, congestion, shortness of breath, wheezing and chest tightness.  Has been seen numerous times for the symptoms.  Patient has taken doxycycline  and prednisone  as prescribed over a week ago.  No associated fevers.  After completing the prednisone  and antibiotic patient reports feeling worse but she felt better while taking.  Using albuterol  multiple times a  day.  Chart review performed of all patient's previous visits.  Blood pressure 159/123.  Ill-appearing but nontoxic.  On exam has nasal congestion.  Throat is clear.  Few scattered wheezes but mostly reduced breath sounds throughout.  Chest x-ray performed is negative.  Reviewed result patient.  Patient given DuoNeb treatment. She found this to be very helpful.  Presentation consistent with continued asthma exacerbation and bronchitis.  Supportive care encouraged.  Sent prednisone  taper to pharmacy.  Continue albuterol  and Promethazine  DM.  Encouraged increased rest and fluids.  Nebulizer solution provided to her.  Advised nebs instead of inhaler is preferred while at home.  Reviewed returning for fever, worsening cough, increased chest discomfort.   Final Clinical Impressions(s) / UC Diagnoses   Final diagnoses:  Asthma with acute exacerbation, unspecified asthma severity, unspecified whether persistent  Acute cough  Acute bronchospasm     Discharge Instructions      -Chest xray negative for pneumonia.  -Presentation consistent with continued asthma exacerbation and bronchitis.  Supportive care encouraged.  Sent prednisone  taper to pharmacy.  Continue albuterol  and Promethazine  DM.  Encouraged increased rest and fluids.  Nebulizer solution script written.  Advised nebs instead of inhaler is preferred while at home.  Reviewed returning for fever, worsening cough, increased chest discomfort.   ED Prescriptions     Medication Sig Dispense Auth. Provider   predniSONE  (DELTASONE ) 10 MG tablet Take 5 tabs po x 2 days, 4 tabs x 2 days, 3 tabs x 2 days, 2 tabs x 2 days, 1 tab x 2 days 30 tablet Nancy Axon B, PA-C   ipratropium-albuterol  (DUONEB) 0.5-2.5 (3) MG/3ML SOLN Take 3 mLs by nebulization every 6 (six) hours as needed. 360 mL Floydene Hy, PA-C      PDMP not reviewed this encounter.   Floydene Hy, PA-C 08/09/23 1440

## 2023-08-24 ENCOUNTER — Ambulatory Visit
Admission: EM | Admit: 2023-08-24 | Discharge: 2023-08-24 | Disposition: A | Discharge status: Home / Self Care | Attending: Emergency Medicine | Admitting: Emergency Medicine

## 2023-08-24 ENCOUNTER — Encounter: Payer: Self-pay | Admitting: Emergency Medicine

## 2023-08-24 DIAGNOSIS — K648 Other hemorrhoids: Secondary | ICD-10-CM

## 2023-08-24 MED ORDER — PROCTOFOAM HC 1-1 % EX FOAM: 1.0000 | Freq: Three times a day (TID) | CUTANEOUS | 0 refills | Status: DC

## 2023-08-24 NOTE — ED Triage Notes (Signed)
 Pt presents with an hemorrhoid that has been hurting for 3 days. Pt has tried OTC treatment with no relief.

## 2023-08-24 NOTE — ED Provider Notes (Signed)
 [facility]    CSN: [id number] Arrival date & time: 08/24/23  1932      History   Chief Complaint Chief Complaint  Patient presents with   Hemorrhoids    HPI Izabela Ow is a 33 y.o. female.   HPI  33 year old female past medical history significant for IBS, migraine headaches, intermittent asthma, pseudotumor cerebri, and endometriosis presents for evaluation of 3 days worth of hemorrhoid pain.  She describes it as a constant burning pain.  She denies any fever or itching.  She does occasionally have blood when she has bowel movements.  She has been using Tucks pads, witch hazel, Preparation H, and Preparation H suppositories without any improvement of symptoms.  Past Medical History:  Diagnosis Date   Asthma    Endometriosis    Endometriosis    Hidradenitis    Hydronephrosis    IBS (irritable bowel syndrome)    Migraine    Migraine    Pseudotumor cerebri    Sciatica    Scoliosis     Patient Active Problem List   Diagnosis Date Noted   Viral URI 07/30/2023   Elevated blood pressure reading 07/30/2023   Family history of breast cancer 05/08/2022   Endometriosis 05/08/2022   Menorrhagia with regular cycle 05/08/2022   Symptomatic cholelithiasis    Tobacco use affecting pregnancy in third trimester, antepartum 10/03/2016   Abdominal pain 09/18/2016   Labor and delivery indication for care or intervention 08/30/2016   Pregnancy 07/03/2016   Abdominal pain affecting pregnancy 06/14/2016   Obesity complicating pregnancy, third trimester 06/11/2016   Supervision of high risk pregnancy, antepartum, third trimester 05/28/2016   History of spinal fusion 04/29/2016   BMI 40.0-44.9, adult (HCC) 04/29/2016   Back pain with left-sided sciatica 03/27/2015   Idiopathic scoliosis 04/20/2012   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   Renal cyst 12/16/2011   Pseudotumor cerebri 01/16/2011   Hydronephrosis of right kidney 10/23/2010   Intermittent asthma  10/23/2010   Migraines 10/23/2010   IBS (irritable bowel syndrome) 10/23/2010    Past Surgical History:  Procedure Laterality Date   CHOLECYSTECTOMY     COLONOSCOPY  2001;2008   KIDNEY SURGERY Right 2012   per pt peice of kidney removed hand has only partial function   LAPAROSCOPY  04/2007   SPINAL FUSION     SPINAL FUSION  2008   SPINAL FUSION     TONSILLECTOMY      OB History     Gravida  1   Para  1   Term  1   Preterm      AB      Living  1      SAB      IAB      Ectopic      Multiple      Live Births  1            Home Medications    Prior to Admission medications   Medication Sig Start Date End Date Taking? Authorizing Provider  hydrocortisone-pramoxine (PROCTOFOAM HC) rectal foam Place 1 applicator rectally 3 (three) times daily. 08/24/23  Yes Bernardino Ditch, NP  AJOVY 225 MG/1.5ML SOAJ Inject into the skin. 11/28/22   [provider]  clindamycin (CLEOCIN T) 1 % external solution SMARTSIG:Sparingly Topical Twice Daily    [provider]  clonazePAM  (KLONOPIN ) 0.5 MG tablet Take by mouth. 03/04/22   [provider]  escitalopram (LEXAPRO) 5 MG tablet Take by mouth. 12/01/22  [provider]  HUMIRA-CD/UC/HS STARTER 80 MG/0.8ML PNKT Inject into the skin. 04/25/22   [provider]  hydrOXYzine (ATARAX) 10 MG tablet Take by mouth. 11/11/22   [provider]  ibuprofen  (ADVIL ) 600 MG tablet Take 1 tablet (600 mg total) by mouth every 6 (six) hours as needed. 12/02/22   Bernardino Ditch, NP  ipratropium (ATROVENT ) 0.06 % nasal spray Place 2 sprays into both nostrils 4 (four) times daily. 04/22/23   Arvis Jolan NOVAK, PA-C  ipratropium-albuterol  (DUONEB) 0.5-2.5 (3) MG/3ML SOLN Take 3 mLs by nebulization every 6 (six) hours as needed. 08/09/23   Arvis Jolan B, PA-C  naproxen  (NAPROSYN ) 500 MG tablet Take 1 tablet (500 mg total) by mouth 2 (two) times daily. 07/18/22   Arvis Jolan NOVAK, PA-C  predniSONE  (DELTASONE )  10 MG tablet Take 5 tabs po x 2 days, 4 tabs x 2 days, 3 tabs x 2 days, 2 tabs x 2 days, 1 tab x 2 days 08/09/23   Arvis Jolan B, PA-C  QUEtiapine Fumarate 150 MG TABS Take by mouth. 04/10/22   [provider]  SUMAtriptan (IMITREX) 25 MG tablet  01/04/18   [provider]  topiramate (TOPAMAX) 25 MG tablet Take by mouth.    [provider]  triamcinolone  cream (KENALOG ) 0.1 % SMARTSIG:Sparingly Topical Twice Daily PRN    [provider]  venlafaxine (EFFEXOR) 100 MG tablet TAKE 1.5 TABLETS BY MOUTH ONCE DAILY. 12/23/21   [provider]    Family History Family History  Problem Relation Age of Onset   Bipolar disorder Mother    Depression Mother    Breast cancer Mother 74   Cancer Father        Kidney   Depression Maternal Grandmother    Breast cancer Maternal Grandmother 12       contact/    Social History Social History   Tobacco Use   Smoking status: Former    Current packs/day: 0.50    Types: Cigarettes   Smokeless tobacco: Never  Vaping Use   Vaping status: Every Day  Substance Use Topics   Alcohol use: No   Drug use: No     Allergies   Cefaclor, Cefadroxil, Cefuroxime, Cephalexin, Clarithromycin, Clindamycin, Erythromycin, Penicillins, Sulfamethoxazole-trimethoprim, Vitamin a, Morphine, Other, Azithromycin , and Red dye #40 (allura red)   Review of Systems Review of Systems  Constitutional:  Negative for fever.  Gastrointestinal:  Positive for blood in stool and rectal pain.     Physical Exam Triage Vital Signs ED Triage Vitals  Encounter Vitals Group     BP 08/24/23 1944 (!) 142/93     Girls Systolic BP Percentile --      Girls Diastolic BP Percentile --      Boys Systolic BP Percentile --      Boys Diastolic BP Percentile --      Pulse Rate 08/24/23 1944 (!) 105     Resp 08/24/23 1944 16     Temp 08/24/23 1944 98.6 F (37 C)     Temp Source 08/24/23 1944 Oral     SpO2 08/24/23 1944 100 %     Weight --       Height --      Head Circumference --      Peak Flow --      Pain Score 08/24/23 1942 8     Pain Loc --      Pain Education --      Exclude from Growth Chart --  No data found.  Updated Vital Signs BP (!) 142/93 (BP Location: Left Wrist)   Pulse (!) 105   Temp 98.6 F (37 C) (Oral)   Resp 16   SpO2 100%   Visual Acuity Right Eye Distance:   Left Eye Distance:   Bilateral Distance:    Right Eye Near:   Left Eye Near:    Bilateral Near:     Physical Exam Vitals and nursing note reviewed. Exam conducted with a chaperone present Colvin Sar, RN).  Constitutional:      Appearance: Normal appearance. She is not ill-appearing.  HENT:     Head: Normocephalic and atraumatic.  Genitourinary:    Rectum: Normal.   Neurological:     Mental Status: She is alert.      UC Treatments / Results  Labs (all labs ordered are listed, but only abnormal results are displayed) Labs Reviewed - No data to display  EKG   Radiology No results found.  Procedures Procedures (including critical care time)  Medications Ordered in UC Medications - No data to display  Initial Impression / Assessment and Plan / UC Course  I have reviewed the triage vital signs and the nursing notes.  Pertinent labs & imaging results that were available during my care of the patient were reviewed by me and considered in my medical decision making (see chart for details).   Patient is a nontoxic-appearing 33 year old female presenting for evaluation of 3 days with a hemorrhoid pain as outlined HPI above.  With Wyn Sar, RN as chaperone I performed an inspection of the patient's rectum.  There are no external hemorrhoids noted but there are a few skin tags around the opening of the rectum.  No active bleeding.  I suspect that the patient is experiencing internal hemorrhoids.  Given that she has been using over-the-counter Preparation H as well as Preparation H suppositories without any improvement of  symptoms I will discharge her home on Proctofoam 1% and she can apply rectally 3 times a day as needed for pain and inflammation.   Final Clinical Impressions(s) / UC Diagnoses   Final diagnoses:  Internal hemorrhoids     Discharge Instructions      Use the Proctofoam every 8 hours as needed for relief of your hemorrhoids.  Insert the applicator into your rectum and expel the applicator worth of foam.  If the foam does not provide relief from your hemorrhoids you need to follow-up with your primary care provider for referral to gastroenterology for further intervention, and possibly surgery.  You may want to consider using over-the-counter stool softener such as Colace to help prevent hard stools which can further irritate your hemorrhoids.  Make sure that you are drinking at least 64 ounces of water daily and consuming significant fiber in your stool to add bulk to your stool to help improve transit time and prevent constipation.  If you develop any increased rectal pain, rectal bleeding, or fevers you need to go to the ER for evaluation.     ED Prescriptions     Medication Sig Dispense Auth. Provider   hydrocortisone-pramoxine (PROCTOFOAM HC) rectal foam Place 1 applicator rectally 3 (three) times daily. 10 g Bernardino Ditch, NP      PDMP not reviewed this encounter.   Bernardino Ditch, NP 08/24/23 [phone]

## 2023-08-24 NOTE — Discharge Instructions (Addendum)
 Use the Proctofoam every 8 hours as needed for relief of your hemorrhoids.  Insert the applicator into your rectum and expel the applicator worth of foam.  If the foam does not provide relief from your hemorrhoids you need to follow-up with your primary care provider for referral to gastroenterology for further intervention, and possibly surgery.  You may want to consider using over-the-counter stool softener such as Colace to help prevent hard stools which can further irritate your hemorrhoids.  Make sure that you are drinking at least 64 ounces of water daily and consuming significant fiber in your stool to add bulk to your stool to help improve transit time and prevent constipation.  If you develop any increased rectal pain, rectal bleeding, or fevers you need to go to the ER for evaluation.

## 2023-09-02 ENCOUNTER — Ambulatory Visit (INDEPENDENT_AMBULATORY_CARE_PROVIDER_SITE_OTHER)

## 2023-09-02 VITALS — BP 125/84 | HR 77 | Ht 65.0 in | Wt 284.9 lb

## 2023-09-02 DIAGNOSIS — Z3042 Encounter for surveillance of injectable contraceptive: Secondary | ICD-10-CM

## 2023-09-02 MED ORDER — MEDROXYPROGESTERONE ACETATE 150 MG/ML IM SUSP
150.0000 mg | Freq: Once | INTRAMUSCULAR | Status: AC
Start: 2023-09-02 — End: 2023-09-02
  Administered 2023-09-02: 150 mg via INTRAMUSCULAR

## 2023-09-02 NOTE — Patient Instructions (Addendum)

## 2023-09-02 NOTE — Progress Notes (Signed)
    NURSE VISIT NOTE  Subjective:    Patient ID: Courtney Sanchez, female    DOB: 1990-06-11, 33 y.o.   MRN: [id number]  HPI  Patient is a 33 y.o. G70P1001 female who presents for depo provera  injection.   Objective:    BP 125/84   Pulse 77   Ht 5' 5 (1.651 m)   Wt 284 lb 14.4 oz (129.2 kg)   LMP 08/30/2023 (Approximate)   BMI 47.41 kg/m   Last Annual: 05/08/2022. Last pap: 05/08/2022. Last Depo-Provera : 04/01/23 Side Effects if any: none. Serum HCG indicated? No . Depo-Provera  150 mg IM given by: Rollo Maxin, CMA. Site: Left Deltoid  Lab Review  No results found for any visits on 09/02/23.  Assessment:   1. Encounter for Depo-Provera  contraception      Plan:   Next appointment due between 10/19/2023 and 11/02/2023.    Rollo JINNY Maxin, CMA

## 2023-09-16 NOTE — Progress Notes (Deleted)
 PCP:  Eliverto Bette Hover, MD   No chief complaint on file.    HPI:      [name] is a 33 y.o. G1P1001 whose LMP was Patient's last menstrual period was 08/30/2023 (approximate)., presents today for her annual examination.  Her menses are regular every 28-30 days, lasting 7 days, heavy flow, with 1/2 dollar sized clots, no BTB.  Dysmenorrhea mod to severe, not improved with NSAIDs. No non-menstrual pelvic pain. Did depo in past with amenorrhea; stopped it about 2 yrs ago, wants to restart it. Hx of migraine with aura/endometriosis.   Sex activity: not sexually active.  Last Pap: 05/08/22 and 10/20/19 Results were: ASCUS with NEGATIVE high risk HPV ; repeat pap due today  There is a FH of breast cancer in her mother and MGM, genetic testing not indicated for pt due to ages, but will cont to follow FH/guidelines. There is no FH of ovarian cancer. The patient does not do self-breast exams.  Tobacco use: vapes daily Alcohol use: none Daily THC vaping for anxiety. Pt on effexor, wellbutrin, and klonopin  with PCP Exercise: not active  She does not get adequate calcium and Vitamin D in her diet.  Patient Active Problem List   Diagnosis Date Noted   Viral URI 07/30/2023   Elevated blood pressure reading 07/30/2023   Family history of breast cancer 05/08/2022   Endometriosis 05/08/2022   Menorrhagia with regular cycle 05/08/2022   Symptomatic cholelithiasis    Tobacco use affecting pregnancy in third trimester, antepartum 10/03/2016   Abdominal pain 09/18/2016   Labor and delivery indication for care or intervention 08/30/2016   Pregnancy 07/03/2016   Abdominal pain affecting pregnancy 06/14/2016   Obesity complicating pregnancy, third trimester 06/11/2016   Supervision of high risk pregnancy, antepartum, third trimester 05/28/2016   History of spinal fusion 04/29/2016   BMI 40.0-44.9, adult (HCC) 04/29/2016   Back pain with left-sided sciatica 03/27/2015    Idiopathic scoliosis 04/20/2012   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   Renal cyst 12/16/2011   Pseudotumor cerebri 01/16/2011   Hydronephrosis of right kidney 10/23/2010   Intermittent asthma 10/23/2010   Migraines 10/23/2010   IBS (irritable bowel syndrome) 10/23/2010    Past Surgical History:  Procedure Laterality Date   CHOLECYSTECTOMY     COLONOSCOPY  2001;2008   KIDNEY SURGERY Right 2012   per pt peice of kidney removed hand has only partial function   LAPAROSCOPY  04/2007   SPINAL FUSION     SPINAL FUSION  2008   SPINAL FUSION     TONSILLECTOMY      Family History  Problem Relation Age of Onset   Bipolar disorder Mother    Depression Mother    Breast cancer Mother 66   Cancer Father        Kidney   Depression Maternal Grandmother    Breast cancer Maternal Grandmother 66       contact/    Social History   Socioeconomic History   Marital status: Single    Spouse name: Not on file   Number of children: Not on file   Years of education: Not on file   Highest education level: Not on file  Occupational History   Not on file  Tobacco Use   Smoking status: Former    Current packs/day: 0.50    Types: Cigarettes   Smokeless tobacco: Never  Vaping Use   Vaping status: Every Day  Substance and Sexual Activity   Alcohol use:  No   Drug use: No   Sexual activity: Not Currently    Birth control/protection: None  Other Topics Concern   Not on file  Social History Narrative   Not on file   Social Drivers of Health   Financial Resource Strain: Not on file  Food Insecurity: Not on file  Transportation Needs: Not on file  Physical Activity: Not on file  Stress: Not on file  Social Connections: Not on file  Intimate Partner Violence: Not on file     Current Outpatient Medications:    AJOVY 225 MG/1.5ML SOAJ, Inject into the skin., Disp: , Rfl:    clindamycin (CLEOCIN T) 1 % external solution, SMARTSIG:Sparingly Topical Twice Daily, Disp: , Rfl:     clonazePAM  (KLONOPIN ) 0.5 MG tablet, Take by mouth., Disp: , Rfl:    escitalopram (LEXAPRO) 5 MG tablet, Take by mouth., Disp: , Rfl:    HUMIRA-CD/UC/HS STARTER 80 MG/0.8ML PNKT, Inject into the skin., Disp: , Rfl:    hydrocortisone-pramoxine (PROCTOFOAM  HC) rectal foam, Place 1 applicator rectally 3 (three) times daily., Disp: 10 g, Rfl: 0   hydrOXYzine (ATARAX) 10 MG tablet, Take by mouth., Disp: , Rfl:    ibuprofen  (ADVIL ) 600 MG tablet, Take 1 tablet (600 mg total) by mouth every 6 (six) hours as needed., Disp: 30 tablet, Rfl: 0   ipratropium (ATROVENT ) 0.06 % nasal spray, Place 2 sprays into both nostrils 4 (four) times daily., Disp: 15 mL, Rfl: 0   ipratropium-albuterol  (DUONEB) 0.5-2.5 (3) MG/3ML SOLN, Take 3 mLs by nebulization every 6 (six) hours as needed., Disp: 360 mL, Rfl: 0   naproxen  (NAPROSYN ) 500 MG tablet, Take 1 tablet (500 mg total) by mouth 2 (two) times daily., Disp: 30 tablet, Rfl: 0   predniSONE  (DELTASONE ) 10 MG tablet, Take 5 tabs po x 2 days, 4 tabs x 2 days, 3 tabs x 2 days, 2 tabs x 2 days, 1 tab x 2 days, Disp: 30 tablet, Rfl: 0   QUEtiapine Fumarate 150 MG TABS, Take by mouth., Disp: , Rfl:    SUMAtriptan (IMITREX) 25 MG tablet, , Disp: , Rfl:    topiramate (TOPAMAX) 25 MG tablet, Take by mouth., Disp: , Rfl:    triamcinolone  cream (KENALOG ) 0.1 %, SMARTSIG:Sparingly Topical Twice Daily PRN, Disp: , Rfl:    venlafaxine (EFFEXOR) 100 MG tablet, TAKE 1.5 TABLETS BY MOUTH ONCE DAILY., Disp: , Rfl:      ROS:  Review of Systems  Constitutional:  Positive for fatigue. Negative for fever and unexpected weight change.  Respiratory:  Negative for cough, shortness of breath and wheezing.   Cardiovascular:  Negative for chest pain, palpitations and leg swelling.  Gastrointestinal:  Negative for blood in stool, constipation, diarrhea, nausea and vomiting.  Endocrine: Negative for cold intolerance, heat intolerance and polyuria.  Genitourinary:  Negative for dyspareunia,  dysuria, flank pain, frequency, genital sores, hematuria, menstrual problem, pelvic pain, urgency, vaginal bleeding, vaginal discharge and vaginal pain.  Musculoskeletal:  Negative for back pain, joint swelling and myalgias.  Skin:  Negative for rash.  Neurological:  Positive for headaches. Negative for dizziness, syncope, light-headedness and numbness.  Hematological:  Negative for adenopathy.  Psychiatric/Behavioral:  Positive for agitation and dysphoric mood. Negative for confusion, sleep disturbance and suicidal ideas. The patient is not nervous/anxious.    BREAST: No symptoms   Objective: LMP 08/30/2023 (Approximate)    Physical Exam Constitutional:      Appearance: She is well-developed.  Genitourinary:     Vulva normal.  Right Labia: No rash, tenderness or lesions.    Left Labia: No tenderness, lesions or rash.    No vaginal discharge, erythema or tenderness.      Right Adnexa: not tender and no mass present.    Left Adnexa: not tender and no mass present.    No cervical friability or polyp.     Uterus is not enlarged or tender.  Breasts:    Right: No mass, nipple discharge, skin change or tenderness.     Left: No mass, nipple discharge, skin change or tenderness.  Neck:     Thyroid: No thyromegaly.  Cardiovascular:     Rate and Rhythm: Normal rate and regular rhythm.     Heart sounds: Normal heart sounds. No murmur heard. Pulmonary:     Effort: Pulmonary effort is normal.     Breath sounds: Normal breath sounds.  Abdominal:     Palpations: Abdomen is soft.     Tenderness: There is no abdominal tenderness. There is no guarding or rebound.  Musculoskeletal:        General: Normal range of motion.     Cervical back: Normal range of motion.  Lymphadenopathy:     Cervical: No cervical adenopathy.  Neurological:     General: No focal deficit present.     Mental Status: She is alert and oriented to person, place, and time.     Cranial Nerves: No cranial nerve  deficit.  Skin:    General: Skin is warm and dry.  Psychiatric:        Mood and Affect: Mood normal.        Behavior: Behavior normal.        Thought Content: Thought content normal.        Judgment: Judgment normal.  Vitals reviewed.     Results: No results found for this or any previous visit (from the past 24 hours).   Assessment/Plan: Encounter for annual routine gynecological examination  Cervical cancer screening - Plan: Cytology - PAP  Encounter for initial prescription of injectable contraceptive - Plan: medroxyPROGESTERone  Acetate SUSY 150 mg; Depo restart today. Increase ca/Vit D.   Menorrhagia with regular cycle - Plan: POCT hemoglobin; WNL HgB. Restarting depo anyway  Endometriosis--will do depo again for sx.   No orders of the defined types were placed in this encounter.            GYN counsel adequate intake of calcium and vitamin D, diet and exercise--exercise has been shown to help with anxiety. D/C THC use which actually increases anxiety/depression sx.      F/U  No follow-ups on file.  Semiyah Newgent B. Blanche Gallien, PA-C 09/16/2023 1:40 PM

## 2023-09-17 ENCOUNTER — Ambulatory Visit: Admitting: Obstetrics and Gynecology

## 2023-09-17 DIAGNOSIS — N920 Excessive and frequent menstruation with regular cycle: Secondary | ICD-10-CM

## 2023-09-17 DIAGNOSIS — R87610 Atypical squamous cells of undetermined significance on cytologic smear of cervix (ASC-US): Secondary | ICD-10-CM

## 2023-09-17 DIAGNOSIS — Z124 Encounter for screening for malignant neoplasm of cervix: Secondary | ICD-10-CM

## 2023-09-17 DIAGNOSIS — Z3042 Encounter for surveillance of injectable contraceptive: Secondary | ICD-10-CM

## 2023-09-17 DIAGNOSIS — Z1151 Encounter for screening for human papillomavirus (HPV): Secondary | ICD-10-CM

## 2023-09-17 DIAGNOSIS — Z01419 Encounter for gynecological examination (general) (routine) without abnormal findings: Secondary | ICD-10-CM

## 2023-09-17 DIAGNOSIS — N809 Endometriosis, unspecified: Secondary | ICD-10-CM

## 2023-11-04 ENCOUNTER — Ambulatory Visit: Admitting: Obstetrics and Gynecology

## 2023-11-18 ENCOUNTER — Ambulatory Visit

## 2023-11-18 VITALS — BP 135/97 | HR 83 | Wt 279.9 lb

## 2023-11-18 DIAGNOSIS — Z3042 Encounter for surveillance of injectable contraceptive: Secondary | ICD-10-CM | POA: Diagnosis not present

## 2023-11-18 MED ORDER — MEDROXYPROGESTERONE ACETATE 150 MG/ML IM SUSP
150.0000 mg | Freq: Once | INTRAMUSCULAR | Status: AC
Start: 2023-11-18 — End: 2023-11-18
  Administered 2023-11-18: 150 mg via INTRAMUSCULAR

## 2023-11-18 NOTE — Patient Instructions (Signed)

## 2023-11-18 NOTE — Progress Notes (Signed)
    NURSE VISIT NOTE  Subjective:    Patient ID: Courtney Sanchez, female    DOB: [birth year], 33 y.o.   MRN: [id number]  HPI  Patient is a 33 y.o. G23P1001 female who presents for depo provera  injection.   Objective:    There were no vitals taken for this visit.  Last Annual: 05/08/22. Last pap: 05/08/22. Last Depo-Provera : 09/02/23. Side Effects if any: none. Serum HCG indicated? No . Depo-Provera  150 mg IM given by: Mathis Getting, CMA. Site: Right Upper Outer Quandrant  Lab Review  No results found for any visits on 11/18/23.  Assessment:   No diagnosis found.   Plan:   Next appointment due between 12/3 and 12/17.    Mathis LITTIE Getting, CMA

## 2023-12-01 NOTE — Progress Notes (Unsigned)
 PCP:  Eliverto Bette Hover, MD   No chief complaint on file.    HPI:      [name] is a 33 y.o. G1P1001 whose LMP was No LMP recorded. Patient has had an injection., presents today for her annual examination.  Her menses are regular every 28-30 days, lasting 7 days, heavy flow, with 1/2 dollar sized clots, no BTB.  Dysmenorrhea mod to severe, not improved with NSAIDs. No non-menstrual pelvic pain. Did depo in past with amenorrhea; stopped it about 2 yrs ago, wants to restart it. Hx of migraine with aura/endometriosis.   Sex activity: not sexually active.  Last Pap: 05/08/22 and 10/20/19 Results were: ASCUS with NEGATIVE high risk HPV ; repeat pap due  There is a FH of breast cancer in her mother and MGM, genetic testing not indicated for pt due to ages, but will cont to follow FH/guidelines. There is no FH of ovarian cancer. The patient does not do self-breast exams.  Tobacco use: vapes daily Alcohol use: none Daily THC vaping for anxiety. Pt on effexor, wellbutrin, and klonopin  with PCP Exercise: not active  She does not get adequate calcium and Vitamin D in her diet.  Patient Active Problem List   Diagnosis Date Noted   Viral URI 07/30/2023   Elevated blood pressure reading 07/30/2023   Family history of breast cancer 05/08/2022   Endometriosis 05/08/2022   Menorrhagia with regular cycle 05/08/2022   Symptomatic cholelithiasis    Tobacco use affecting pregnancy in third trimester, antepartum 10/03/2016   Abdominal pain 09/18/2016   Labor and delivery indication for care or intervention 08/30/2016   Pregnancy 07/03/2016   Abdominal pain affecting pregnancy 06/14/2016   Obesity complicating pregnancy, third trimester 06/11/2016   Supervision of high risk pregnancy, antepartum, third trimester 05/28/2016   History of spinal fusion 04/29/2016   BMI 40.0-44.9, adult (HCC) 04/29/2016   Back pain with left-sided sciatica 03/27/2015   Idiopathic scoliosis  04/20/2012   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   Renal cyst 12/16/2011   Pseudotumor cerebri 01/16/2011   Hydronephrosis of right kidney 10/23/2010   Intermittent asthma 10/23/2010   Migraines 10/23/2010   IBS (irritable bowel syndrome) 10/23/2010    Past Surgical History:  Procedure Laterality Date   CHOLECYSTECTOMY     COLONOSCOPY  2001;2008   KIDNEY SURGERY Right 2012   per pt peice of kidney removed hand has only partial function   LAPAROSCOPY  04/2007   SPINAL FUSION     SPINAL FUSION  2008   SPINAL FUSION     TONSILLECTOMY      Family History  Problem Relation Age of Onset   Bipolar disorder Mother    Depression Mother    Breast cancer Mother 27   Cancer Father        Kidney   Depression Maternal Grandmother    Breast cancer Maternal Grandmother 77       contact/    Social History   Socioeconomic History   Marital status: Single    Spouse name: Not on file   Number of children: Not on file   Years of education: Not on file   Highest education level: Not on file  Occupational History   Not on file  Tobacco Use   Smoking status: Former    Current packs/day: 0.50    Types: Cigarettes   Smokeless tobacco: Never  Vaping Use   Vaping status: Every Day  Substance and Sexual Activity   Alcohol use:  No   Drug use: No   Sexual activity: Not Currently    Birth control/protection: None  Other Topics Concern   Not on file  Social History Narrative   Not on file   Social Drivers of Health   Financial Resource Strain: Not on file  Food Insecurity: Not on file  Transportation Needs: Not on file  Physical Activity: Not on file  Stress: Not on file  Social Connections: Not on file  Intimate Partner Violence: Not on file     Current Outpatient Medications:    AJOVY 225 MG/1.5ML SOAJ, Inject into the skin., Disp: , Rfl:    clindamycin (CLEOCIN T) 1 % external solution, SMARTSIG:Sparingly Topical Twice Daily, Disp: , Rfl:    clonazePAM  (KLONOPIN ) 0.5 MG  tablet, Take by mouth., Disp: , Rfl:    escitalopram (LEXAPRO) 5 MG tablet, Take by mouth., Disp: , Rfl:    HUMIRA-CD/UC/HS STARTER 80 MG/0.8ML PNKT, Inject into the skin., Disp: , Rfl:    hydrocortisone-pramoxine (PROCTOFOAM  HC) rectal foam, Place 1 applicator rectally 3 (three) times daily., Disp: 10 g, Rfl: 0   hydrOXYzine (ATARAX) 10 MG tablet, Take by mouth., Disp: , Rfl:    ibuprofen  (ADVIL ) 600 MG tablet, Take 1 tablet (600 mg total) by mouth every 6 (six) hours as needed., Disp: 30 tablet, Rfl: 0   ipratropium (ATROVENT ) 0.06 % nasal spray, Place 2 sprays into both nostrils 4 (four) times daily., Disp: 15 mL, Rfl: 0   ipratropium-albuterol  (DUONEB) 0.5-2.5 (3) MG/3ML SOLN, Take 3 mLs by nebulization every 6 (six) hours as needed., Disp: 360 mL, Rfl: 0   naproxen  (NAPROSYN ) 500 MG tablet, Take 1 tablet (500 mg total) by mouth 2 (two) times daily., Disp: 30 tablet, Rfl: 0   predniSONE  (DELTASONE ) 10 MG tablet, Take 5 tabs po x 2 days, 4 tabs x 2 days, 3 tabs x 2 days, 2 tabs x 2 days, 1 tab x 2 days, Disp: 30 tablet, Rfl: 0   QUEtiapine Fumarate 150 MG TABS, Take by mouth., Disp: , Rfl:    SUMAtriptan (IMITREX) 25 MG tablet, , Disp: , Rfl:    topiramate (TOPAMAX) 25 MG tablet, Take by mouth., Disp: , Rfl:    triamcinolone  cream (KENALOG ) 0.1 %, SMARTSIG:Sparingly Topical Twice Daily PRN, Disp: , Rfl:    venlafaxine (EFFEXOR) 100 MG tablet, TAKE 1.5 TABLETS BY MOUTH ONCE DAILY., Disp: , Rfl:      ROS:  Review of Systems  Constitutional:  Positive for fatigue. Negative for fever and unexpected weight change.  Respiratory:  Negative for cough, shortness of breath and wheezing.   Cardiovascular:  Negative for chest pain, palpitations and leg swelling.  Gastrointestinal:  Negative for blood in stool, constipation, diarrhea, nausea and vomiting.  Endocrine: Negative for cold intolerance, heat intolerance and polyuria.  Genitourinary:  Negative for dyspareunia, dysuria, flank pain,  frequency, genital sores, hematuria, menstrual problem, pelvic pain, urgency, vaginal bleeding, vaginal discharge and vaginal pain.  Musculoskeletal:  Negative for back pain, joint swelling and myalgias.  Skin:  Negative for rash.  Neurological:  Positive for headaches. Negative for dizziness, syncope, light-headedness and numbness.  Hematological:  Negative for adenopathy.  Psychiatric/Behavioral:  Positive for agitation and dysphoric mood. Negative for confusion, sleep disturbance and suicidal ideas. The patient is not nervous/anxious.    BREAST: No symptoms   Objective: There were no vitals taken for this visit.   Physical Exam Constitutional:      Appearance: She is well-developed.  Genitourinary:  Vulva normal.     Right Labia: No rash, tenderness or lesions.    Left Labia: No tenderness, lesions or rash.    No vaginal discharge, erythema or tenderness.      Right Adnexa: not tender and no mass present.    Left Adnexa: not tender and no mass present.    No cervical friability or polyp.     Uterus is not enlarged or tender.  Breasts:    Right: No mass, nipple discharge, skin change or tenderness.     Left: No mass, nipple discharge, skin change or tenderness.  Neck:     Thyroid: No thyromegaly.  Cardiovascular:     Rate and Rhythm: Normal rate and regular rhythm.     Heart sounds: Normal heart sounds. No murmur heard. Pulmonary:     Effort: Pulmonary effort is normal.     Breath sounds: Normal breath sounds.  Abdominal:     Palpations: Abdomen is soft.     Tenderness: There is no abdominal tenderness. There is no guarding or rebound.  Musculoskeletal:        General: Normal range of motion.     Cervical back: Normal range of motion.  Lymphadenopathy:     Cervical: No cervical adenopathy.  Neurological:     General: No focal deficit present.     Mental Status: She is alert and oriented to person, place, and time.     Cranial Nerves: No cranial nerve deficit.   Skin:    General: Skin is warm and dry.  Psychiatric:        Mood and Affect: Mood normal.        Behavior: Behavior normal.        Thought Content: Thought content normal.        Judgment: Judgment normal.  Vitals reviewed.     Results: No results found for this or any previous visit (from the past 24 hours).   Assessment/Plan: Encounter for annual routine gynecological examination  Cervical cancer screening - Plan: Cytology - PAP  Encounter for initial prescription of injectable contraceptive - Plan: medroxyPROGESTERone  Acetate SUSY 150 mg; Depo restart today. Increase ca/Vit D.   Menorrhagia with regular cycle - Plan: POCT hemoglobin; WNL HgB. Restarting depo anyway  Endometriosis--will do depo again for sx.   No orders of the defined types were placed in this encounter.            GYN counsel adequate intake of calcium and vitamin D, diet and exercise--exercise has been shown to help with anxiety. D/C THC use which actually increases anxiety/depression sx.      F/U  No follow-ups on file.  Toryn Dewalt B. Daysie Helf, PA-C 12/01/2023 9:00 AM

## 2023-12-03 ENCOUNTER — Other Ambulatory Visit (HOSPITAL_COMMUNITY)
Admission: RE | Admit: 2023-12-03 | Discharge: 2023-12-03 | Disposition: A | Discharge status: Home / Self Care | Source: Ambulatory Visit | Attending: Obstetrics and Gynecology | Admitting: Obstetrics and Gynecology

## 2023-12-03 ENCOUNTER — Encounter: Payer: Self-pay | Admitting: Obstetrics and Gynecology

## 2023-12-03 ENCOUNTER — Ambulatory Visit (INDEPENDENT_AMBULATORY_CARE_PROVIDER_SITE_OTHER): Admitting: Obstetrics and Gynecology

## 2023-12-03 VITALS — BP 134/82 | HR 101 | Ht 65.0 in | Wt 273.0 lb

## 2023-12-03 DIAGNOSIS — Z01411 Encounter for gynecological examination (general) (routine) with abnormal findings: Secondary | ICD-10-CM

## 2023-12-03 DIAGNOSIS — Z124 Encounter for screening for malignant neoplasm of cervix: Secondary | ICD-10-CM | POA: Diagnosis present

## 2023-12-03 DIAGNOSIS — R87610 Atypical squamous cells of undetermined significance on cytologic smear of cervix (ASC-US): Secondary | ICD-10-CM

## 2023-12-03 DIAGNOSIS — N809 Endometriosis, unspecified: Secondary | ICD-10-CM

## 2023-12-03 DIAGNOSIS — N898 Other specified noninflammatory disorders of vagina: Secondary | ICD-10-CM | POA: Diagnosis not present

## 2023-12-03 DIAGNOSIS — Z1151 Encounter for screening for human papillomavirus (HPV): Secondary | ICD-10-CM | POA: Diagnosis not present

## 2023-12-03 DIAGNOSIS — N920 Excessive and frequent menstruation with regular cycle: Secondary | ICD-10-CM

## 2023-12-03 DIAGNOSIS — Z01419 Encounter for gynecological examination (general) (routine) without abnormal findings: Secondary | ICD-10-CM

## 2023-12-03 DIAGNOSIS — Z3042 Encounter for surveillance of injectable contraceptive: Secondary | ICD-10-CM

## 2023-12-03 LAB — POCT WET PREP WITH KOH
Clue Cells Wet Prep HPF POC: NEGATIVE
KOH Prep POC: NEGATIVE
Trichomonas, UA: NEGATIVE
Yeast Wet Prep HPF POC: NEGATIVE

## 2023-12-03 MED ORDER — FLUCONAZOLE 150 MG PO TABS: 150.0000 mg | ORAL_TABLET | Freq: Once | ORAL | 0 refills | Status: AC

## 2023-12-03 MED ORDER — MEDROXYPROGESTERONE ACETATE 150 MG/ML IM SUSP: 150.0000 mg | INTRAMUSCULAR | Status: AC

## 2023-12-03 NOTE — Patient Instructions (Signed)
 I value your feedback and you entrusting [place] with your care. If you get a [place] patient survey, I would appreciate you taking the time to let us know about your experience today. Thank you! ? ? ?

## 2023-12-06 ENCOUNTER — Encounter: Payer: Self-pay | Admitting: Obstetrics and Gynecology

## 2023-12-07 ENCOUNTER — Ambulatory Visit: Payer: Self-pay | Admitting: Obstetrics and Gynecology

## 2023-12-07 ENCOUNTER — Other Ambulatory Visit: Payer: Self-pay | Admitting: Obstetrics and Gynecology

## 2023-12-07 LAB — CYTOLOGY - PAP
Adequacy: ABSENT
Comment: NEGATIVE
Diagnosis: NEGATIVE
High risk HPV: NEGATIVE

## 2023-12-07 MED ORDER — CLOTRIMAZOLE-BETAMETHASONE 1-0.05 % EX CREA: TOPICAL_CREAM | CUTANEOUS | 0 refills | Status: AC

## 2023-12-07 NOTE — Progress Notes (Signed)
 Rx lotrisone crm for yeast vag sx.

## 2024-02-03 ENCOUNTER — Ambulatory Visit

## 2024-02-03 VITALS — BP 131/73 | HR 90 | Ht 65.0 in | Wt 275.4 lb

## 2024-02-03 DIAGNOSIS — Z3042 Encounter for surveillance of injectable contraceptive: Secondary | ICD-10-CM | POA: Diagnosis not present

## 2024-02-03 MED ORDER — MEDROXYPROGESTERONE ACETATE 150 MG/ML IM SUSP
150.0000 mg | Freq: Once | INTRAMUSCULAR | Status: AC
  Administered 2024-02-03: 150 mg via INTRAMUSCULAR

## 2024-02-03 NOTE — Patient Instructions (Signed)

## 2024-02-03 NOTE — Progress Notes (Addendum)
    NURSE VISIT NOTE  Subjective:    Patient ID: Courtney Sanchez, female    DOB: Sep 11, 1990, 33 y.o.   MRN: [id number]  HPI  Patient is a 33 y.o. G22P1001 female who presents for depo provera  injection.   Objective:    BP 131/73   Pulse 90   Ht 5' 5 (1.651 m)   Wt 275 lb 6.4 oz (124.9 kg)   BMI 45.83 kg/m   Last Annual: 12/03/23. Last pap: 12/03/23. Last Depo-Provera : 11/18/23. Side Effects if any: none. Serum HCG indicated? No . Depo-Provera  150 mg IM given by: Mathis Getting, CMA. Site: Left Upper Outer Quandrant  Lab Review  No results found for any visits on 02/03/24.  Assessment:   1. Encounter for management and injection of depo-Provera       Plan:   Next appointment due between 04/20/24 and 05/04/24.    Mathis LITTIE Getting, CMA

## 2024-02-10 ENCOUNTER — Ambulatory Visit
Admission: EM | Admit: 2024-02-10 | Discharge: 2024-02-10 | Disposition: A | Discharge status: Home / Self Care | Attending: Physician Assistant | Admitting: Physician Assistant

## 2024-02-10 DIAGNOSIS — J101 Influenza due to other identified influenza virus with other respiratory manifestations: Secondary | ICD-10-CM | POA: Diagnosis not present

## 2024-02-10 DIAGNOSIS — R509 Fever, unspecified: Secondary | ICD-10-CM | POA: Diagnosis not present

## 2024-02-10 DIAGNOSIS — J3489 Other specified disorders of nose and nasal sinuses: Secondary | ICD-10-CM | POA: Diagnosis not present

## 2024-02-10 DIAGNOSIS — R051 Acute cough: Secondary | ICD-10-CM

## 2024-02-10 DIAGNOSIS — J45901 Unspecified asthma with (acute) exacerbation: Secondary | ICD-10-CM

## 2024-02-10 LAB — POC COVID19/FLU A&B COMBO
Covid Antigen, POC: NEGATIVE
Influenza A Antigen, POC: POSITIVE — AB
Influenza B Antigen, POC: NEGATIVE

## 2024-02-10 MED ORDER — PREDNISONE 20 MG PO TABS: 40.0000 mg | ORAL_TABLET | Freq: Every day | ORAL | 0 refills | Status: AC

## 2024-02-10 MED ORDER — OSELTAMIVIR PHOSPHATE 6 MG/ML PO SUSR: 75.0000 mg | Freq: Two times a day (BID) | ORAL | 0 refills | Status: AC

## 2024-02-10 MED ORDER — PROMETHAZINE-DM 6.25-15 MG/5ML PO SYRP: 5.0000 mL | ORAL_SOLUTION | Freq: Four times a day (QID) | ORAL | 0 refills | Status: DC | PRN

## 2024-02-10 MED ORDER — IPRATROPIUM BROMIDE 0.06 % NA SOLN: 2.0000 | Freq: Four times a day (QID) | NASAL | 0 refills | Status: AC

## 2024-02-10 NOTE — Discharge Instructions (Addendum)
-   Flu is positive.  You are within the window for treatment Tamiflu to potentially be helpful so I sent it to the pharmacy if you are positive. - Sent cough medicine and Tamiflu. - You need to isolate until you are fever free for 24 hours and symptoms are improving. - Increase rest and fluids. - You should be seen again if you have uncontrolled fever, weakness or worsening breathing problem.

## 2024-02-10 NOTE — ED Triage Notes (Signed)
 Pt c/o cough,bilateral ear pain,congestion & sinus pressure x3 days. Has tried OTC meds w/o relief. Also c/o fever that started today. Tmax 101.6.

## 2024-02-10 NOTE — ED Provider Notes (Signed)
 [facility]    CSN: [id number] Arrival date & time: 02/10/24  0830      History   Chief Complaint Chief Complaint  Patient presents with   Cough   Otalgia   Sinus Problem    HPI Courtney Sanchez is a 33 y.o. female with history of asthma presenting for 2 day history of fatigue, cough, congestion, facial pain,and bilateral ear pain. Reports fever up to 101.6 degrees started today. Has also had wheezing and shortness of breath. She denies sore throat, chest pain, diarrhea, vomiting or abdominal pain.  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  She has been taking Mucinex , Benadryl , Flonse, Tylenol  and doing albuterol  nebs.  No other complaints.  HPI  Past Medical History:  Diagnosis Date   Asthma    Endometriosis    Endometriosis    Hidradenitis    Hydronephrosis    IBS (irritable bowel syndrome)    Migraine    Migraine    Pseudotumor cerebri    Sciatica    Scoliosis     Patient Active Problem List   Diagnosis Date Noted   Nasal congestion 12/25/2023   Viral URI 07/30/2023   Elevated blood pressure reading 07/30/2023   Acute bacterial sinusitis 07/30/2023   Family history of breast cancer 05/08/2022   Endometriosis 05/08/2022   Menorrhagia with regular cycle 05/08/2022   Depression 01/06/2022   Symptomatic cholelithiasis    Sinus pain 10/05/2020   Atrophy of right kidney 06/10/2017   GBS (group B Streptococcus carrier), +RV culture, currently pregnant 10/17/2016   Tobacco abuse 10/03/2016   Abdominal pain 09/18/2016   Labor and delivery indication for care or intervention 08/30/2016   Pregnancy 07/03/2016   Abdominal pain affecting pregnancy 06/14/2016   Obesity complicating pregnancy, third trimester 06/11/2016   Supervision of high risk pregnancy, antepartum, third trimester 05/28/2016   S/P spinal fusion 04/29/2016   BMI 40.0-44.9, adult (HCC) 04/29/2016   Back pain with left-sided sciatica 03/27/2015    Idiopathic scoliosis 04/20/2012   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   Renal cyst 12/16/2011   Pseudotumor cerebri 01/16/2011   Hydronephrosis of right kidney 10/23/2010   Intermittent asthma 10/23/2010   Migraines 10/23/2010   IBS (irritable bowel syndrome) 10/23/2010    Past Surgical History:  Procedure Laterality Date   CHOLECYSTECTOMY     COLONOSCOPY  2001;2008   KIDNEY SURGERY Right 2012   per pt peice of kidney removed hand has only partial function   LAPAROSCOPY  04/2007   SPINAL FUSION     SPINAL FUSION  2008   SPINAL FUSION     TONSILLECTOMY      OB History     Gravida  1   Para  1   Term  1   Preterm      AB      Living  1      SAB      IAB      Ectopic      Multiple      Live Births  1            Home Medications    Prior to Admission medications   Medication Sig Start Date End Date Taking? Authorizing Provider  clotrimazole -betamethasone  (LOTRISONE ) cream Apply externally BID prn sx up to 2 wks 12/07/23   Copland, Alicia B, PA-C  ipratropium (ATROVENT ) 0.06 % nasal spray Place 2 sprays into both nostrils 4 (four) times daily. 02/10/24  Yes  Arvis Jolan NOVAK, PA-C  oseltamivir (TAMIFLU) 6 MG/ML SUSR suspension Take 12.5 mLs (75 mg total) by mouth 2 (two) times daily for 5 days. 02/10/24 02/15/24 Yes Arvis Jolan NOVAK, PA-C  predniSONE  (DELTASONE ) 20 MG tablet Take 2 tablets (40 mg total) by mouth daily for 5 days. 02/10/24 02/15/24 Yes Arvis Jolan B, PA-C  promethazine -dextromethorphan (PROMETHAZINE -DM) 6.25-15 MG/5ML syrup Take 5 mLs by mouth 4 (four) times daily as needed. 02/10/24  Yes Arvis Jolan B, PA-C  AJOVY 225 MG/1.5ML SOAJ Inject into the skin. 11/28/22   [provider]  albuterol  (VENTOLIN  HFA) 108 (90 Base) MCG/ACT inhaler Inhale 2 puffs into the lungs. 08/01/23 07/31/24  [provider]  Azelaic Acid 15 % gel 1(ONE) APPLICATION(S) TOPICAL 2(TWO) TIMES A DAY TO FACE 11/19/23   [provider]  busPIRone  (BUSPAR) 10 MG tablet TAKE 1/2 TABLET BY MOUTH TWICE A DAY FOR 5 DAYS THEN 1 TABLET TWICE A DAY 11/10/23   [provider]  cephALEXin (KEFLEX) 500 MG capsule 2(TWO) CAPSULE(S) ORAL 2(TWO) TIMES A DAY Patient not taking: Reported on 12/03/2023 11/19/23   [provider]  clindamycin (CLEOCIN T) 1 % external solution SMARTSIG:Sparingly Topical Twice Daily    [provider]  clonazePAM  (KLONOPIN ) 0.5 MG tablet Take by mouth. 03/04/22   [provider]  GORDAN DAN 300 MG/2ML SOAJ  11/25/23   [provider]  escitalopram (LEXAPRO) 20 MG tablet Take 20 mg by mouth every morning.    [provider]  fluticasone (FLONASE) 50 MCG/ACT nasal spray Place 2 sprays into both nostrils daily. 08/01/23   [provider]  hydrOXYzine (ATARAX) 10 MG tablet Take by mouth. 11/11/22   [provider]  omeprazole (PRILOSEC) 20 MG capsule Take 20 mg by mouth. 12/01/23 12/15/23  [provider]  QUEtiapine (SEROQUEL) 200 MG tablet Take 200 mg by mouth at bedtime.    [provider]  rizatriptan (MAXALT) 10 MG tablet Take 10 mg by mouth. 09/07/23   [provider]  spironolactone (ALDACTONE) 100 MG tablet Take 100 mg by mouth. 11/19/23   [provider]  SUMAtriptan (IMITREX) 25 MG tablet  01/04/18   [provider]  tiZANidine (ZANAFLEX) 4 MG tablet Take 4 mg by mouth 3 (three) times daily as needed.    [provider]  topiramate (TOPAMAX) 100 MG tablet Take 100 mg by mouth at bedtime. 09/21/23   [provider]  topiramate (TOPAMAX) 50 MG tablet Take 50 mg by mouth 3 (three) times daily.    [provider]  triamcinolone  cream (KENALOG ) 0.1 % SMARTSIG:Sparingly Topical Twice Daily PRN    [provider]  triamcinolone  ointment (KENALOG ) 0.1 % SMARTSIG:1 Application Topical 2-3 Times Daily 09/14/23   [provider]    Family History Family History  Problem  Relation Age of Onset   Bipolar disorder Mother    Depression Mother    Breast cancer Mother 36   Cancer Father        Kidney   Depression Maternal Grandmother    Breast cancer Maternal Grandmother 49       contact/    Social History Social History   Tobacco Use   Smoking status: Former    Current packs/day: 0.50    Types: Cigarettes   Smokeless tobacco: Never  Vaping Use   Vaping status: Every Day  Substance Use Topics   Alcohol use: No   Drug use: No     Allergies  Cefaclor, Cefadroxil, Cefuroxime, Cephalexin, Clarithromycin, Clindamycin, Erythromycin, Penicillins, Sulfamethoxazole-trimethoprim, Vitamin a, Morphine, Other, Azithromycin , and Red dye #40 (allura red)   Review of Systems Review of Systems  Constitutional:  Positive for fatigue and fever. Negative for chills and diaphoresis.  HENT:  Positive for congestion, ear pain, rhinorrhea, sinus pressure and sinus pain. Negative for sore throat.   Respiratory:  Positive for cough, shortness of breath and wheezing.   Cardiovascular:  Negative for chest pain.  Gastrointestinal:  Negative for abdominal pain, nausea and vomiting.  Musculoskeletal:  Negative for myalgias.  Skin:  Negative for rash.  Neurological:  Positive for headaches. Negative for weakness.  Hematological:  Negative for adenopathy.     Physical Exam Triage Vital Signs ED Triage Vitals  Encounter Vitals Group     BP      Systolic BP Percentile      Diastolic BP Percentile      Pulse      Resp      Temp      Temp src      SpO2      Weight      Height      Head Circumference      Peak Flow      Pain Score      Pain Loc      Pain Education      Exclude from Growth Chart    No data found.  Updated Vital Signs BP 111/77 (BP Location: Left Arm)   Pulse (!) 108   Temp 99.9 F (37.7 C) (Oral)   Resp 16   Wt 276 lb 9.6 oz (125.5 kg)   SpO2 98%   BMI 46.03 kg/m       Physical Exam Vitals and nursing note reviewed.   Constitutional:      General: She is not in acute distress.    Appearance: Normal appearance. She is ill-appearing. She is not toxic-appearing.  HENT:     Head: Normocephalic and atraumatic.     Right Ear: External ear normal. There is impacted cerumen.     Left Ear: Ear canal and external ear normal. A middle ear effusion is present. Tympanic membrane is erythematous.     Nose: Congestion present.     Mouth/Throat:     Mouth: Mucous membranes are moist.     Pharynx: Oropharynx is clear.  Eyes:     General: No scleral icterus.       Right eye: No discharge.        Left eye: No discharge.     Conjunctiva/sclera: Conjunctivae normal.  Cardiovascular:     Rate and Rhythm: Regular rhythm. Tachycardia present.     Heart sounds: Normal heart sounds.  Pulmonary:     Effort: Pulmonary effort is normal. No respiratory distress.     Breath sounds: Normal breath sounds.  Musculoskeletal:     Cervical back: Neck supple.  Skin:    General: Skin is dry.  Neurological:     General: No focal deficit present.     Mental Status: She is alert. Mental status is at baseline.     Motor: No weakness.     Gait: Gait normal.  Psychiatric:        Mood and Affect: Mood normal.        Behavior: Behavior normal.      UC Treatments / Results  Labs (all labs ordered are listed, but only abnormal results are displayed) Labs Reviewed  POC COVID19/FLU A&B COMBO -  Abnormal; Notable for the following components:      Result Value   Influenza A Antigen, POC Positive (*)    All other components within normal limits     EKG   Radiology No results found.   Procedures Procedures (including critical care time)  Medications Ordered in UC Medications - No data to display  Initial Impression / Assessment and Plan / UC Course  I have reviewed the triage vital signs and the nursing notes.  Pertinent labs & imaging results that were available during my care of the patient were reviewed by me and  considered in my medical decision making (see chart for details).   32 year old female presents for approximately 2 day history of sinus pain, nasal congestion, and cough. Onset of fever up to 101.6 degrees today. Wheezing and SOB. Taking OTC meds and using albuterol .  On exam she is ill-appearing and has nasal congestion, erythema of left TM with effusion. Cerumen impaction right TM.  Throat is clear.  Chest clear auscultation.  Heart regular rate and rhythm.  COVID/flu rapid testing obtained. +flu A  Reviewed all results with patient.  She has a viral illness and asthma exacerbation.  Treating at this time with Tamiflu, Promethazine  DM, Atrovent  nasal spray and prednisone .  Encouraged use of inhalers.  Reviewed typical course of illness as well as CDC guidelines and isolation protocols. Reviewed return precautions.  Acute illness with systemic symptoms of mild exacerbation of chronic underlying condition.  Final Clinical Impressions(s) / UC Diagnoses   Final diagnoses:  Fever, unspecified  Influenza A  Exacerbation of asthma, unspecified asthma severity, unspecified whether persistent  Acute cough  Sinus pressure     Discharge Instructions      - Flu is positive.  You are within the window for treatment Tamiflu to potentially be helpful so I sent it to the pharmacy if you are positive. - Sent cough medicine and Tamiflu. - You need to isolate until you are fever free for 24 hours and symptoms are improving. - Increase rest and fluids. - You should be seen again if you have uncontrolled fever, weakness or worsening breathing problem.        ED Prescriptions     Medication Sig Dispense Auth. Provider   predniSONE  (DELTASONE ) 20 MG tablet Take 2 tablets (40 mg total) by mouth daily for 5 days. 10 tablet Arvis Huxley B, PA-C   promethazine -dextromethorphan (PROMETHAZINE -DM) 6.25-15 MG/5ML syrup Take 5 mLs by mouth 4 (four) times daily as needed. 118 mL Arvis Huxley B, PA-C    ipratropium (ATROVENT ) 0.06 % nasal spray Place 2 sprays into both nostrils 4 (four) times daily. 15 mL Arvis Huxley B, PA-C   oseltamivir (TAMIFLU) 6 MG/ML SUSR suspension Take 12.5 mLs (75 mg total) by mouth 2 (two) times daily for 5 days. 125 mL Arvis Huxley NOVAK, PA-C      PDMP not reviewed this encounter.      Arvis Huxley NOVAK, PA-C 02/10/24 0926

## 2024-02-16 ENCOUNTER — Ambulatory Visit
Admission: EM | Admit: 2024-02-16 | Discharge: 2024-02-16 | Disposition: A | Discharge status: Home / Self Care | Attending: Physician Assistant | Admitting: Physician Assistant

## 2024-02-16 ENCOUNTER — Encounter: Payer: Self-pay | Admitting: Emergency Medicine

## 2024-02-16 ENCOUNTER — Ambulatory Visit

## 2024-02-16 DIAGNOSIS — H66002 Acute suppurative otitis media without spontaneous rupture of ear drum, left ear: Secondary | ICD-10-CM

## 2024-02-16 DIAGNOSIS — R051 Acute cough: Secondary | ICD-10-CM

## 2024-02-16 DIAGNOSIS — R062 Wheezing: Secondary | ICD-10-CM

## 2024-02-16 DIAGNOSIS — J45901 Unspecified asthma with (acute) exacerbation: Secondary | ICD-10-CM

## 2024-02-16 MED ORDER — AMOXICILLIN-POT CLAVULANATE 875-125 MG PO TABS: 1.0000 | ORAL_TABLET | Freq: Two times a day (BID) | ORAL | 0 refills | Status: AC

## 2024-02-16 MED ORDER — PREDNISONE 50 MG PO TABS: 50.0000 mg | ORAL_TABLET | Freq: Every day | ORAL | 0 refills | Status: AC

## 2024-02-16 MED ORDER — PROMETHAZINE-DM 6.25-15 MG/5ML PO SYRP: 5.0000 mL | ORAL_SOLUTION | Freq: Four times a day (QID) | ORAL | 0 refills | Status: AC | PRN

## 2024-02-16 NOTE — ED Provider Notes (Signed)
 [facility]    CSN: [id number] Arrival date & time: 02/16/24  0810      History   Chief Complaint Chief Complaint  Patient presents with   Wheezing   Cough   Otalgia    HPI Courtney Sanchez is a 33 y.o. female with history of asthma presenting for 8 day history of fatigue, cough, congestion, facial pain, and left ear pain. Reports fever up to 101.6 degrees at onset when she was diagnosed with influenza A.  Has checked her temperature with a thermometer on her face and temperature has been 103 degrees.  However, when she checks her temperature in her mouth it is normal.  Has also had wheezing and shortness of breath. She denies sore throat, chest pain, diarrhea, vomiting or abdominal pain.  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  She has been taking Mucinex , Benadryl , Flonse, Tylenol  and doing albuterol  nebs.  She was also prescribed Tamiflu , prednisone  and promethazine  DM on 02/10/24.  Patient says she was feeling better while on prednisone  but after she finished it symptoms got worse.  No other complaints.  HPI  Past Medical History:  Diagnosis Date   Asthma    Endometriosis    Endometriosis    Hidradenitis    Hydronephrosis    IBS (irritable bowel syndrome)    Migraine    Migraine    Pseudotumor cerebri    Sciatica    Scoliosis     Patient Active Problem List   Diagnosis Date Noted   Nasal congestion 12/25/2023   Viral URI 07/30/2023   Elevated blood pressure reading 07/30/2023   Acute bacterial sinusitis 07/30/2023   Family history of breast cancer 05/08/2022   Endometriosis 05/08/2022   Menorrhagia with regular cycle 05/08/2022   Depression 01/06/2022   Symptomatic cholelithiasis    Sinus pain 10/05/2020   Atrophy of right kidney 06/10/2017   GBS (group B Streptococcus carrier), +RV culture, currently pregnant 10/17/2016   Tobacco abuse 10/03/2016   Abdominal pain 09/18/2016   Labor and delivery  indication for care or intervention 08/30/2016   Pregnancy 07/03/2016   Abdominal pain affecting pregnancy 06/14/2016   Obesity complicating pregnancy, third trimester 06/11/2016   Supervision of high risk pregnancy, antepartum, third trimester 05/28/2016   S/P spinal fusion 04/29/2016   BMI 40.0-44.9, adult (HCC) 04/29/2016   Back pain with left-sided sciatica 03/27/2015   Idiopathic scoliosis 04/20/2012   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   Renal cyst 12/16/2011   Pseudotumor cerebri 01/16/2011   Hydronephrosis of right kidney 10/23/2010   Intermittent asthma 10/23/2010   Migraines 10/23/2010   IBS (irritable bowel syndrome) 10/23/2010    Past Surgical History:  Procedure Laterality Date   CHOLECYSTECTOMY     COLONOSCOPY  2001;2008   KIDNEY SURGERY Right 2012   per pt peice of kidney removed hand has only partial function   LAPAROSCOPY  04/2007   SPINAL FUSION     SPINAL FUSION  2008   SPINAL FUSION     TONSILLECTOMY      OB History     Gravida  1   Para  1   Term  1   Preterm      AB      Living  1      SAB      IAB      Ectopic      Multiple      Live Births  1  Home Medications    Prior to Admission medications  Medication Sig Start Date End Date Taking? Authorizing Provider  amoxicillin -clavulanate (AUGMENTIN ) 875-125 MG tablet Take 1 tablet by mouth every 12 (twelve) hours for 7 days. 02/16/24 02/23/24 Yes Arvis Huxley B, PA-C  clotrimazole -betamethasone  (LOTRISONE ) cream Apply externally BID prn sx up to 2 wks 12/07/23   Copland, Alicia B, PA-C  predniSONE  (DELTASONE ) 50 MG tablet Take 1 tablet (50 mg total) by mouth daily for 5 days. 02/16/24 02/21/24 Yes Arvis Huxley B, PA-C  promethazine -dextromethorphan (PROMETHAZINE -DM) 6.25-15 MG/5ML syrup Take 5 mLs by mouth 4 (four) times daily as needed. 02/16/24  Yes Arvis Huxley B, PA-C  AJOVY 225 MG/1.5ML SOAJ Inject into the skin. 11/28/22   [provider]  albuterol   (VENTOLIN  HFA) 108 (90 Base) MCG/ACT inhaler Inhale 2 puffs into the lungs. 08/01/23 07/31/24  [provider]  Azelaic Acid 15 % gel 1(ONE) APPLICATION(S) TOPICAL 2(TWO) TIMES A DAY TO FACE 11/19/23   [provider]  busPIRone (BUSPAR) 10 MG tablet TAKE 1/2 TABLET BY MOUTH TWICE A DAY FOR 5 DAYS THEN 1 TABLET TWICE A DAY 11/10/23   [provider]  cephALEXin (KEFLEX) 500 MG capsule 2(TWO) CAPSULE(S) ORAL 2(TWO) TIMES A DAY Patient not taking: Reported on 12/03/2023 11/19/23   [provider]  clindamycin (CLEOCIN T) 1 % external solution SMARTSIG:Sparingly Topical Twice Daily    [provider]  clonazePAM  (KLONOPIN ) 0.5 MG tablet Take by mouth. 03/04/22   [provider]  GORDAN DAN 300 MG/2ML SOAJ  11/25/23   [provider]  escitalopram (LEXAPRO) 20 MG tablet Take 20 mg by mouth every morning.    [provider]  fluticasone (FLONASE) 50 MCG/ACT nasal spray Place 2 sprays into both nostrils daily. 08/01/23   [provider]  hydrOXYzine (ATARAX) 10 MG tablet Take by mouth. 11/11/22   [provider]  ipratropium (ATROVENT ) 0.06 % nasal spray Place 2 sprays into both nostrils 4 (four) times daily. 02/10/24   Arvis Huxley NOVAK, PA-C  omeprazole (PRILOSEC) 20 MG capsule Take 20 mg by mouth. 12/01/23 12/15/23  [provider]  QUEtiapine (SEROQUEL) 200 MG tablet Take 200 mg by mouth at bedtime.    [provider]  rizatriptan (MAXALT) 10 MG tablet Take 10 mg by mouth. 09/07/23   [provider]  spironolactone (ALDACTONE) 100 MG tablet Take 100 mg by mouth. 11/19/23   [provider]  SUMAtriptan (IMITREX) 25 MG tablet  01/04/18   [provider]  tiZANidine (ZANAFLEX) 4 MG tablet Take 4 mg by mouth 3 (three) times daily as needed.    [provider]  topiramate (TOPAMAX) 100 MG tablet Take 100 mg by mouth at bedtime. 09/21/23   [provider]  topiramate  (TOPAMAX) 50 MG tablet Take 50 mg by mouth 3 (three) times daily.    [provider]  triamcinolone  cream (KENALOG ) 0.1 % SMARTSIG:Sparingly Topical Twice Daily PRN    [provider]  triamcinolone  ointment (KENALOG ) 0.1 % SMARTSIG:1 Application Topical 2-3 Times Daily 09/14/23   [provider]    Family History Family History  Problem Relation Age of Onset   Bipolar disorder Mother    Depression Mother    Breast cancer Mother 30   Cancer Father        Kidney   Depression Maternal Grandmother    Breast cancer Maternal Grandmother 44       contact/    Social History Social History  Tobacco Use   Smoking status: Former    Current packs/day: 0.50    Types: Cigarettes   Smokeless tobacco: Never  Vaping Use   Vaping status: Every Day  Substance Use Topics   Alcohol use: No   Drug use: No     Allergies   Cefaclor, Cefadroxil, Cefuroxime, Cephalexin, Clarithromycin, Clindamycin, Erythromycin, Penicillins, Sulfamethoxazole-trimethoprim, Vitamin a, Morphine, Other, Azithromycin , and Red dye #40 (allura red)   Review of Systems Review of Systems  Constitutional:  Positive for fatigue and fever. Negative for chills and diaphoresis.  HENT:  Positive for congestion, ear pain, rhinorrhea, sinus pressure and sinus pain. Negative for sore throat.   Respiratory:  Positive for cough, shortness of breath and wheezing.   Cardiovascular:  Negative for chest pain.  Gastrointestinal:  Negative for abdominal pain, nausea and vomiting.  Musculoskeletal:  Negative for myalgias.  Skin:  Negative for rash.  Neurological:  Positive for headaches. Negative for weakness.  Hematological:  Negative for adenopathy.     Physical Exam Triage Vital Signs ED Triage Vitals  Encounter Vitals Group     BP      Systolic BP Percentile      Diastolic BP Percentile      Pulse      Resp      Temp      Temp src      SpO2      Weight      Height      Head Circumference       Peak Flow      Pain Score      Pain Loc      Pain Education      Exclude from Growth Chart    No data found.  Updated Vital Signs BP 133/89 (BP Location: Left Wrist)   Pulse (!) 115   Temp 98.1 F (36.7 C) (Oral)   Resp 17   Wt 277 lb (125.6 kg)   SpO2 96%   BMI 46.10 kg/m       Physical Exam Vitals and nursing note reviewed.  Constitutional:      General: She is not in acute distress.    Appearance: Normal appearance. She is ill-appearing. She is not toxic-appearing.  HENT:     Head: Normocephalic and atraumatic.     Right Ear: Tympanic membrane, ear canal and external ear normal.     Left Ear: Ear canal and external ear normal. A middle ear effusion is present. Tympanic membrane is erythematous and bulging.     Nose: Congestion present.     Mouth/Throat:     Mouth: Mucous membranes are moist.     Pharynx: Oropharynx is clear.  Eyes:     General: No scleral icterus.       Right eye: No discharge.        Left eye: No discharge.     Conjunctiva/sclera: Conjunctivae normal.  Cardiovascular:     Rate and Rhythm: Regular rhythm. Tachycardia present.     Heart sounds: Normal heart sounds.  Pulmonary:     Effort: Pulmonary effort is normal. No respiratory distress.     Breath sounds: Rhonchi present.  Musculoskeletal:     Cervical back: Neck supple.  Skin:    General: Skin is dry.  Neurological:     General: No focal deficit present.     Mental Status: She is alert. Mental status is at baseline.     Motor: No weakness.     Gait: Gait normal.  Psychiatric:        Mood and Affect: Mood normal.        Behavior: Behavior normal.      UC Treatments / Results  Labs (all labs ordered are listed, but only abnormal results are displayed) Labs Reviewed - No data to display    EKG   Radiology DG Chest 2 View Result Date: 02/16/2024 EXAM: 2 VIEW(S) XRAY OF THE CHEST 02/16/2024 08:39:25 AM COMPARISON: 08/09/2023 CLINICAL HISTORY: Cough congestion and  wheezing for a week. Diagnosed with the flu 6 days ago. History of asthma. FINDINGS: LUNGS AND PLEURA: No focal pulmonary opacity. No pleural effusion. No pneumothorax. HEART AND MEDIASTINUM: No acute abnormality of the cardiac and mediastinal silhouettes. BONES AND SOFT TISSUES: Stable thoracolumbar posterior rod and screw fusion hardware. IMPRESSION: 1. No acute findings. Electronically signed by: Evalene Coho MD 02/16/2024 08:55 AM EST RP Workstation: [id number]     Procedures Procedures (including critical care time)  Medications Ordered in UC Medications - No data to display  Initial Impression / Assessment and Plan / UC Course  I have reviewed the triage vital signs and the nursing notes.  Pertinent labs & imaging results that were available during my care of the patient were reviewed by me and considered in my medical decision making (see chart for details).   33 year old female presents for approximately 8 day history of sinus pain, nasal congestion, and cough. Onset of fever up to 101.6 degrees at onset. Wheezing and SOB. Taking OTC meds and using albuterol . Prescribed Tamiflu , prednisone  and promethazine  by me on 02/10/24 after testing positive for flu A.  On exam she is ill-appearing and has nasal congestion, erythema of left TM with effusion and bulging of left TM. Normal right TM.  Throat is clear.  Scattered rhonchi. Heart regular rate and rhythm.  Reviewed all results with patient.  She has a viral illness and asthma exacerbation.  Now, appears to have otitis media.  Will treat with Augmentin .  She has penicillin and cephalosporins listed in her allergies but reports her mother was a bit of a hypochondriac when she was younger and thought she was allergic to everything.  She has taken Augmentin  this year and done fine with it.  Continue Promethazine  DM, Atrovent  nasal spray and prednisone .  Encouraged use of inhalers.  Reviewed typical course of illness as well as CDC guidelines  and isolation protocols. Reviewed return precautions.  Acute illness with systemic symptoms of mild exacerbation of chronic underlying condition.  Final Clinical Impressions(s) / UC Diagnoses   Final diagnoses:  Wheezing  Acute suppurative otitis media of left ear without spontaneous rupture of tympanic membrane, recurrence not specified  Acute cough  Asthma with acute exacerbation, unspecified asthma severity, unspecified whether persistent     Discharge Instructions      - You have an ear infection.  I sent an antibiotic to the pharmacy.  You confirm that you have taken Augmentin  before and done fine with this medication. - I also sent more prednisone  and cough medication. - I will call you if the radiologist sees pneumonia on your x-ray but I did not. - If you are still feeling feverish after the next few days or symptoms worsen please return.        ED Prescriptions     Medication Sig Dispense Auth. Provider   amoxicillin -clavulanate (AUGMENTIN ) 875-125 MG tablet Take 1 tablet by mouth every 12 (twelve) hours for 7 days. 14 tablet Arvis Jolan NOVAK, PA-C   predniSONE  (  DELTASONE ) 50 MG tablet Take 1 tablet (50 mg total) by mouth daily for 5 days. 5 tablet Arvis Huxley B, PA-C   promethazine -dextromethorphan (PROMETHAZINE -DM) 6.25-15 MG/5ML syrup Take 5 mLs by mouth 4 (four) times daily as needed. 118 mL Arvis Huxley NOVAK, PA-C      PDMP not reviewed this encounter.       Arvis Huxley NOVAK, PA-C 02/16/24 [phone]

## 2024-02-16 NOTE — ED Triage Notes (Addendum)
 Pt was seen on the 10th and dx with Flu A. She continues to have wheezing, chest tightness, left ear pain and a cough. Pt has taken theraflu and medications prescribed.

## 2024-02-16 NOTE — Discharge Instructions (Addendum)
-   You have an ear infection.  I sent an antibiotic to the pharmacy.  You confirm that you have taken Augmentin  before and done fine with this medication. - I also sent more prednisone  and cough medication. - I will call you if the radiologist sees pneumonia on your x-ray but I did not. - If you are still feeling feverish after the next few days or symptoms worsen please return.

## 2024-02-24 ENCOUNTER — Ambulatory Visit
Admission: EM | Admit: 2024-02-24 | Discharge: 2024-02-24 | Disposition: A | Discharge status: Home / Self Care | Attending: Physician Assistant | Admitting: Physician Assistant

## 2024-02-24 DIAGNOSIS — J029 Acute pharyngitis, unspecified: Secondary | ICD-10-CM

## 2024-02-24 DIAGNOSIS — B370 Candidal stomatitis: Secondary | ICD-10-CM | POA: Diagnosis not present

## 2024-02-24 LAB — POCT RAPID STREP A (OFFICE): Rapid Strep A Screen: NEGATIVE

## 2024-02-24 MED ORDER — NYSTATIN 100000 UNIT/ML MT SUSP: OROMUCOSAL | 0 refills | Status: AC

## 2024-02-24 NOTE — Discharge Instructions (Signed)
-   Strep negative. - You have thrush. - I sent an antifungal mouthwash to the pharmacy.

## 2024-02-24 NOTE — ED Provider Notes (Signed)
 " [facility]    CSN: [id number] Arrival date & time: 02/24/24  1524      History   Chief Complaint Chief Complaint  Patient presents with   Rash    HPI Courtney Sanchez is a 33 y.o. female presenting for 2-day history of sore throat and white exudates in mouth.  Patient believes she has thrush.  Has had thrush in the past.  Denies fever.  Cough and congestion have improved from previous visit last week.  Has recently been on antibiotics and steroids.  HPI  Past Medical History:  Diagnosis Date   Asthma    Endometriosis    Endometriosis    Hidradenitis    Hydronephrosis    IBS (irritable bowel syndrome)    Migraine    Migraine    Pseudotumor cerebri    Sciatica    Scoliosis     Patient Active Problem List   Diagnosis Date Noted   Nasal congestion 12/25/2023   Viral URI 07/30/2023   Elevated blood pressure reading 07/30/2023   Acute bacterial sinusitis 07/30/2023   Family history of breast cancer 05/08/2022   Endometriosis 05/08/2022   Menorrhagia with regular cycle 05/08/2022   Depression 01/06/2022   Symptomatic cholelithiasis    Sinus pain 10/05/2020   Atrophy of right kidney 06/10/2017   GBS (group Sanchez Streptococcus carrier), +RV culture, currently pregnant 10/17/2016   Tobacco abuse 10/03/2016   Abdominal pain 09/18/2016   Labor and delivery indication for care or intervention 08/30/2016   Pregnancy 07/03/2016   Abdominal pain affecting pregnancy 06/14/2016   Obesity complicating pregnancy, third trimester 06/11/2016   Supervision of high risk pregnancy, antepartum, third trimester 05/28/2016   S/P spinal fusion 04/29/2016   BMI 40.0-44.9, adult (HCC) 04/29/2016   Back pain with left-sided sciatica 03/27/2015   Idiopathic scoliosis 04/20/2012   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   Renal cyst 12/16/2011   Pseudotumor cerebri 01/16/2011   Hydronephrosis of right kidney 10/23/2010   Intermittent asthma 10/23/2010   Migraines 10/23/2010    IBS (irritable bowel syndrome) 10/23/2010    Past Surgical History:  Procedure Laterality Date   CHOLECYSTECTOMY     COLONOSCOPY  2001;2008   KIDNEY SURGERY Right 2012   per pt peice of kidney removed hand has only partial function   LAPAROSCOPY  04/2007   SPINAL FUSION     SPINAL FUSION  2008   SPINAL FUSION     TONSILLECTOMY      OB History     Gravida  1   Para  1   Term  1   Preterm      AB      Living  1      SAB      IAB      Ectopic      Multiple      Live Births  1            Home Medications    Prior to Admission medications  Medication Sig Start Date End Date Taking? Authorizing Provider  clotrimazole -betamethasone  (LOTRISONE ) cream Apply externally BID prn sx up to 2 wks 12/07/23   Copland, Alicia Sanchez, Courtney Sanchez  nystatin  (MYCOSTATIN ) 100000 UNIT/ML suspension 5 mL p.o. swish and retain in mouth for several seconds before spitting every 6 hours until 48 hours after symptoms have resolved 02/24/24  Yes Courtney Sanchez Sanchez, Courtney Sanchez  AJOVY 225 MG/1.5ML SOAJ Inject into the skin. 11/28/22   [provider]  albuterol  (VENTOLIN  HFA) 108 (  90 Base) MCG/ACT inhaler Inhale 2 puffs into the lungs. 08/01/23 07/31/24  [provider]  Azelaic Acid 15 % gel 1(ONE) APPLICATION(S) TOPICAL 2(TWO) TIMES A DAY TO FACE 11/19/23   [provider]  busPIRone (BUSPAR) 10 MG tablet TAKE 1/2 TABLET BY MOUTH TWICE A DAY FOR 5 DAYS THEN 1 TABLET TWICE A DAY 11/10/23   [provider]  cephALEXin (KEFLEX) 500 MG capsule 2(TWO) CAPSULE(S) ORAL 2(TWO) TIMES A DAY Patient not taking: Reported on 12/03/2023 11/19/23   [provider]  clindamycin (CLEOCIN T) 1 % external solution SMARTSIG:Sparingly Topical Twice Daily    [provider]  clonazePAM  (KLONOPIN ) 0.5 MG tablet Take by mouth. 03/04/22   [provider]  GORDAN DAN 300 MG/2ML SOAJ  11/25/23   [provider]  escitalopram (LEXAPRO) 20 MG tablet Take 20 mg by  mouth every morning.    [provider]  fluticasone (FLONASE) 50 MCG/ACT nasal spray Place 2 sprays into both nostrils daily. 08/01/23   [provider]  hydrOXYzine (ATARAX) 10 MG tablet Take by mouth. 11/11/22   [provider]  ipratropium (ATROVENT ) 0.06 % nasal spray Place 2 sprays into both nostrils 4 (four) times daily. 02/10/24   Courtney Courtney NOVAK, Courtney Sanchez  omeprazole (PRILOSEC) 20 MG capsule Take 20 mg by mouth. 12/01/23 12/15/23  [provider]  promethazine -dextromethorphan (PROMETHAZINE -DM) 6.25-15 MG/5ML syrup Take 5 mLs by mouth 4 (four) times daily as needed. 02/16/24   Courtney Courtney NOVAK, Courtney Sanchez  QUEtiapine (SEROQUEL) 200 MG tablet Take 200 mg by mouth at bedtime.    [provider]  rizatriptan (MAXALT) 10 MG tablet Take 10 mg by mouth. 09/07/23   [provider]  spironolactone (ALDACTONE) 100 MG tablet Take 100 mg by mouth. 11/19/23   [provider]  SUMAtriptan (IMITREX) 25 MG tablet  01/04/18   [provider]  tiZANidine (ZANAFLEX) 4 MG tablet Take 4 mg by mouth 3 (three) times daily as needed.    [provider]  topiramate (TOPAMAX) 100 MG tablet Take 100 mg by mouth at bedtime. 09/21/23   [provider]  topiramate (TOPAMAX) 50 MG tablet Take 50 mg by mouth 3 (three) times daily.    [provider]  triamcinolone  cream (KENALOG ) 0.1 % SMARTSIG:Sparingly Topical Twice Daily PRN    [provider]  triamcinolone  ointment (KENALOG ) 0.1 % SMARTSIG:1 Application Topical 2-3 Times Daily 09/14/23   [provider]    Family History Family History  Problem Relation Age of Onset   Bipolar disorder Mother    Depression Mother    Breast cancer Mother 2   Cancer Father        Kidney   Depression Maternal Grandmother    Breast cancer Maternal Grandmother 2       contact/    Social History Social History[1]   Allergies   Cefaclor, Cefadroxil, Cefuroxime, Cephalexin,  Clarithromycin, Clindamycin, Erythromycin, Penicillins, Sulfamethoxazole-trimethoprim, Vitamin a, Morphine, Other, Azithromycin , and Red dye #40 (allura red)   Review of Systems Review of Systems  Constitutional:  Negative for chills, diaphoresis, fatigue and fever.  HENT:  Positive for sore throat. Negative for congestion, ear pain, rhinorrhea, sinus pressure and sinus pain.   Respiratory:  Negative for cough and shortness of breath.   Cardiovascular:  Negative for chest pain.  Gastrointestinal:  Negative for abdominal pain, nausea and vomiting.  Musculoskeletal:  Negative for arthralgias and myalgias.  Skin:  Positive for rash.  Neurological:  Negative for  weakness and headaches.  Hematological:  Negative for adenopathy.     Physical Exam Triage Vital Signs ED Triage Vitals  Encounter Vitals Group     BP      Girls Systolic BP Percentile      Girls Diastolic BP Percentile      Boys Systolic BP Percentile      Boys Diastolic BP Percentile      Pulse      Resp      Temp      Temp src      SpO2      Weight      Height      Head Circumference      Peak Flow      Pain Score      Pain Loc      Pain Education      Exclude from Growth Chart    No data found.  Updated Vital Signs Pulse (!) 116   Temp 99 F (37.2 C) (Oral)   Resp 18   SpO2 99%     Physical Exam Vitals and nursing note reviewed.  Constitutional:      General: She is not in acute distress.    Appearance: Normal appearance. She is not ill-appearing or toxic-appearing.  HENT:     Head: Normocephalic and atraumatic.     Nose: Nose normal.     Mouth/Throat:     Mouth: Mucous membranes are moist.     Pharynx: Oropharynx is clear. Posterior oropharyngeal erythema present.     Comments: Thick white exudates of buccal mucosa and posterior pharynx. Eyes:     General: No scleral icterus.       Right eye: No discharge.        Left eye: No discharge.     Conjunctiva/sclera: Conjunctivae normal.   Cardiovascular:     Rate and Rhythm: Normal rate and regular rhythm.     Heart sounds: Normal heart sounds.  Pulmonary:     Effort: Pulmonary effort is normal. No respiratory distress.     Breath sounds: Normal breath sounds.  Musculoskeletal:     Cervical back: Neck supple.  Skin:    General: Skin is dry.  Neurological:     General: No focal deficit present.     Mental Status: She is alert. Mental status is at baseline.     Motor: No weakness.     Gait: Gait normal.  Psychiatric:        Mood and Affect: Mood normal.        Behavior: Behavior normal.      UC Treatments / Results  Labs (all labs ordered are listed, but only abnormal results are displayed) Labs Reviewed  POCT RAPID STREP A (OFFICE) - Normal    EKG   Radiology No results found.  Procedures Procedures (including critical care time)  Medications Ordered in UC Medications - No data to display  Initial Impression / Assessment and Plan / UC Course  I have reviewed the triage vital signs and the nursing notes.  Pertinent labs & imaging results that were available during my care of the patient were reviewed by me and considered in my medical decision making (see chart for details).   33 year old female presents for sore throat and exudates in mouth x 2 days.  Recently has been on antibiotics and corticosteroids.  Rapid strep negative.  Oral thrush.  Supportive care encouraged increased rest and fluids.  Sent nystatin  mouthwash.  Reviewed return precautions.  Final Clinical Impressions(s) / UC Diagnoses   Final diagnoses:  Sore throat  Oral thrush     Discharge Instructions      - Strep negative. - You have thrush. - I sent an antifungal mouthwash to the pharmacy.     ED Prescriptions     Medication Sig Dispense Auth. Provider   nystatin  (MYCOSTATIN ) 100000 UNIT/ML suspension 5 mL p.o. swish and retain in mouth for several seconds before spitting every 6 hours until 48 hours after  symptoms have resolved 473 mL Courtney Courtney NOVAK, Courtney Sanchez      PDMP not reviewed this encounter.     [1]  Social History Tobacco Use   Smoking status: Former    Current packs/day: 0.50    Types: Cigarettes   Smokeless tobacco: Never  Vaping Use   Vaping status: Every Day  Substance Use Topics   Alcohol use: No   Drug use: No     Courtney Sanchez 02/24/24 1633  "

## 2024-02-24 NOTE — ED Triage Notes (Signed)
 Patient presents to UC for thrush noted 2 days ago. Sore throat since yesterday. Not taking any meds for pain.

## 2024-03-31 ENCOUNTER — Ambulatory Visit
Admission: EM | Admit: 2024-03-31 | Discharge: 2024-03-31 | Disposition: A | Discharge status: Home / Self Care | Attending: Family Medicine | Admitting: Family Medicine

## 2024-03-31 DIAGNOSIS — R052 Subacute cough: Secondary | ICD-10-CM

## 2024-03-31 DIAGNOSIS — J4521 Mild intermittent asthma with (acute) exacerbation: Secondary | ICD-10-CM | POA: Diagnosis not present

## 2024-03-31 MED ORDER — HYDROCOD POLI-CHLORPHE POLI ER 10-8 MG/5ML PO SUER: 5.0000 mL | Freq: Two times a day (BID) | ORAL | 0 refills | Status: AC | PRN

## 2024-03-31 MED ORDER — PREDNISONE 10 MG PO TABS: ORAL_TABLET | ORAL | 0 refills | Status: AC

## 2024-03-31 MED ORDER — ALBUTEROL SULFATE HFA 108 (90 BASE) MCG/ACT IN AERS: 2.0000 | INHALATION_SPRAY | RESPIRATORY_TRACT | 1 refills | Status: AC | PRN

## 2024-03-31 NOTE — ED Provider Notes (Addendum)
 " [facility]    CSN: [id number] Arrival date & time: 03/31/24  1012      History   Chief Complaint Chief Complaint  Patient presents with   Cough   Nasal Congestion    HPI Noelly Lasseigne is a 34 y.o. female.   HPI  History obtained from the patient. Mariafernanda presents for cough, rhinorrhea and nasal congestion for the past month. Has been taking a lot of Mucinex  (4 boxes). She was prescribed a nose spray which isn't help. She has been coughing to the point where she has incontinence. Has productive cough. Took a course of prednisone  which helped but then her symptoms returned.    She works as a LAWYER. Her job COVID tested her and it was negative.    She vapes and is a former cigarette smoker.  Has asthma.       Past Medical History:  Diagnosis Date   Asthma    Endometriosis    Endometriosis    Hidradenitis    Hydronephrosis    IBS (irritable bowel syndrome)    Migraine    Migraine    Pseudotumor cerebri    Sciatica    Scoliosis     Patient Active Problem List   Diagnosis Date Noted   Nasal congestion 12/25/2023   Viral URI 07/30/2023   Elevated blood pressure reading 07/30/2023   Acute bacterial sinusitis 07/30/2023   Family history of breast cancer 05/08/2022   Endometriosis 05/08/2022   Menorrhagia with regular cycle 05/08/2022   Depression 01/06/2022   Symptomatic cholelithiasis    Sinus pain 10/05/2020   Atrophy of right kidney 06/10/2017   GBS (group B Streptococcus carrier), +RV culture, currently pregnant 10/17/2016   Tobacco abuse 10/03/2016   Abdominal pain 09/18/2016   Labor and delivery indication for care or intervention 08/30/2016   Pregnancy 07/03/2016   Abdominal pain affecting pregnancy 06/14/2016   Obesity complicating pregnancy, third trimester 06/11/2016   Supervision of high risk pregnancy, antepartum, third trimester 05/28/2016   S/P spinal fusion 04/29/2016   BMI 40.0-44.9, adult (HCC) 04/29/2016   Back pain with  left-sided sciatica 03/27/2015   Idiopathic scoliosis 04/20/2012   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   Renal cyst 12/16/2011   Pseudotumor cerebri 01/16/2011   Hydronephrosis of right kidney 10/23/2010   Intermittent asthma 10/23/2010   Migraines 10/23/2010   IBS (irritable bowel syndrome) 10/23/2010    Past Surgical History:  Procedure Laterality Date   CHOLECYSTECTOMY     COLONOSCOPY  2001;2008   KIDNEY SURGERY Right 2012   per pt peice of kidney removed hand has only partial function   LAPAROSCOPY  04/2007   SPINAL FUSION     SPINAL FUSION  2008   SPINAL FUSION     TONSILLECTOMY      OB History     Gravida  1   Para  1   Term  1   Preterm      AB      Living  1      SAB      IAB      Ectopic      Multiple      Live Births  1            Home Medications    Prior to Admission medications  Medication Sig Start Date End Date Taking? Authorizing Provider  AJOVY 225 MG/1.5ML SOAJ Inject into the skin. 11/28/22  Yes [provider]  busPIRone (BUSPAR) 10 MG  tablet TAKE 1/2 TABLET BY MOUTH TWICE A DAY FOR 5 DAYS THEN 1 TABLET TWICE A DAY 11/10/23  Yes [provider]  chlorpheniramine-HYDROcodone  (TUSSIONEX) 10-8 MG/5ML Take 5 mLs by mouth every 12 (twelve) hours as needed. 03/31/24  Yes Jerrianne Hartin, DO  Clindamycin-Benzoyl Per, Refr, gel Apply 1 Application topically daily. 03/24/24  Yes [provider]  clonazePAM  (KLONOPIN ) 0.5 MG tablet Take by mouth. 03/04/22  Yes [provider]  clotrimazole -betamethasone  (LOTRISONE ) cream Apply externally BID prn sx up to 2 wks 12/07/23   Copland, Alicia B, PA-C  doxycycline  (VIBRAMYCIN ) 100 MG capsule Take 100 mg by mouth 2 (two) times daily. 03/24/24  Yes [provider]  escitalopram (LEXAPRO) 20 MG tablet Take 20 mg by mouth every morning.   Yes [provider]  predniSONE  (DELTASONE ) 10 MG tablet Take 6 tabs by mouth daily for 2 days, then 5 tabs for 2 days,  then 4 tabs for2 days, then 3 tabs for 2 days, then 2 tabs for 2 days, then 1 tab for 2 days. 03/31/24  Yes Shamonica Schadt, DO  QUEtiapine (SEROQUEL) 200 MG tablet Take 200 mg by mouth at bedtime.   Yes [provider]  spironolactone (ALDACTONE) 100 MG tablet Take 100 mg by mouth. 11/19/23  Yes [provider]  albuterol  (VENTOLIN  HFA) 108 (90 Base) MCG/ACT inhaler Inhale 2 puffs into the lungs every 4 (four) hours as needed for wheezing or shortness of breath. 03/31/24 03/31/25  Nashaun Hillmer, DO  Azelaic Acid 15 % gel 1(ONE) APPLICATION(S) TOPICAL 2(TWO) TIMES A DAY TO FACE Patient not taking: Reported on 03/31/2024 11/19/23   [provider]  cephALEXin (KEFLEX) 500 MG capsule 2(TWO) CAPSULE(S) ORAL 2(TWO) TIMES A DAY Patient not taking: Reported on 12/03/2023 11/19/23   [provider]  clindamycin (CLEOCIN T) 1 % external solution SMARTSIG:Sparingly Topical Twice Daily Patient not taking: Reported on 03/31/2024    [provider]  COSENTYX UNOREADY 300 MG/2ML SOAJ  11/25/23   [provider]  fluticasone (FLONASE) 50 MCG/ACT nasal spray Place 2 sprays into both nostrils daily. Patient not taking: Reported on 03/31/2024 08/01/23   [provider]  hydrOXYzine (ATARAX) 10 MG tablet Take by mouth. 11/11/22   [provider]  ipratropium (ATROVENT ) 0.06 % nasal spray Place 2 sprays into both nostrils 4 (four) times daily. 02/10/24   Arvis Jolan NOVAK, PA-C  nystatin  (MYCOSTATIN ) 100000 UNIT/ML suspension 5 mL p.o. swish and retain in mouth for several seconds before spitting every 6 hours until 48 hours after symptoms have resolved Patient not taking: Reported on 03/31/2024 02/24/24   Arvis Jolan NOVAK, PA-C  omeprazole (PRILOSEC) 20 MG capsule Take 20 mg by mouth. 12/01/23 12/15/23  [provider]  promethazine -dextromethorphan (PROMETHAZINE -DM) 6.25-15 MG/5ML syrup Take 5 mLs by mouth 4 (four) times daily as needed. Patient not  taking: Reported on 03/31/2024 02/16/24   Arvis Jolan B, PA-C  rizatriptan (MAXALT) 10 MG tablet Take 10 mg by mouth. 09/07/23   [provider]  SUMAtriptan (IMITREX) 25 MG tablet  01/04/18   [provider]  tiZANidine (ZANAFLEX) 4 MG tablet Take 4 mg by mouth 3 (three) times daily as needed.    [provider]  topiramate (TOPAMAX) 100 MG tablet Take 100 mg by mouth at bedtime. 09/21/23   [provider]  topiramate (TOPAMAX) 50 MG tablet Take 50 mg by mouth 3 (three) times daily.    [provider]  triamcinolone  cream (KENALOG ) 0.1 %  SMARTSIG:Sparingly Topical Twice Daily PRN Patient not taking: Reported on 03/31/2024    [provider]  triamcinolone  ointment (KENALOG ) 0.1 % SMARTSIG:1 Application Topical 2-3 Times Daily Patient not taking: Reported on 03/31/2024 09/14/23   [provider]    Family History Family History  Problem Relation Age of Onset   Bipolar disorder Mother    Depression Mother    Breast cancer Mother 69   Cancer Father        Kidney   Depression Maternal Grandmother    Breast cancer Maternal Grandmother 45       contact/    Social History Social History[1]   Allergies   Cefaclor, Cefadroxil, Cefuroxime, Cephalexin, Clarithromycin, Clindamycin, Erythromycin, Penicillins, Sulfamethoxazole-trimethoprim, Vitamin a, Morphine, Other, Azithromycin , and Red dye #40 (allura red)   Review of Systems Review of Systems: negative unless otherwise stated in HPI.      Physical Exam Triage Vital Signs ED Triage Vitals [03/31/24 1027]  Encounter Vitals Group     BP      Girls Systolic BP Percentile      Girls Diastolic BP Percentile      Boys Systolic BP Percentile      Boys Diastolic BP Percentile      Pulse      Resp      Temp      Temp src      SpO2      Weight 281 lb 3.2 oz (127.6 kg)     Height      Head Circumference      Peak Flow      Pain Score 0     Pain Loc      Pain Education       Exclude from Growth Chart    No data found.  Updated Vital Signs BP 133/89 (BP Location: Left Arm)   Pulse 84   Temp 98.3 F (36.8 C) (Oral)   Resp 20   Wt 127.6 kg   SpO2 99%   BMI 46.79 kg/m   Visual Acuity Right Eye Distance:   Left Eye Distance:   Bilateral Distance:    Right Eye Near:   Left Eye Near:    Bilateral Near:     Physical Exam GEN:     alert, non-toxic appearing female in no distress    HENT:  mucus membranes moist EYES:   no scleral injection or discharge RESP:  no increased work of breathing, faint expiratory wheezing bilaterally, good air movement bilaterally CVS:   regular rate and rhythm Skin:   warm and dry    UC Treatments / Results  Labs (all labs ordered are listed, but only abnormal results are displayed) Labs Reviewed - No data to display  EKG   Radiology No results found.   Procedures Procedures (including critical care time)  Medications Ordered in UC Medications - No data to display  Initial Impression / Assessment and Plan / UC Course  I have reviewed the triage vital signs and the nursing notes.  Pertinent labs & imaging results that were available during my care of the patient were reviewed by me and considered in my medical decision making (see chart for details).       Pt is a 34 y.o. female who presents for 4-5 weeks of cough that is not improving.  Trinity is  afebrile here without recent antipyretics. Satting well on room air. Overall pt is  non-toxic appearing, well hydrated, without respiratory distress. Pulmonary exam is remarkable  for faint expiratory wheezing with frequent dry cough.  Discussed utility in a chest x-ray given her prolonged cough but she declined.  COVID  and influenza testing deferred due to length of symptoms.  Work given COVID testing was negative.   Treat acute asthma exacerbation with prolonged steroid taper.  Albuterol  inhaler prescribed.  Use every 4-6 hours and right before bed for the  next 48 hours.  Tussionex cough syrup given for cough and allow patient to rest.  Typical duration of symptoms discussed.  Considered antibiotics but she is taking doxycycline  100 mg twice a day for her acne which will cover for atypical bacteria.  Return and ED precautions given and patient voiced understanding.  Work note declined.  Discussed MDM, treatment plan and plan for follow-up with patient who agrees with plan.      Final Clinical Impressions(s) / UC Diagnoses   Final diagnoses:  Subacute cough  Mild intermittent asthma with acute exacerbation     Discharge Instructions      5 out of pharmacy to pick up your antibiotics and steroids.  Use your albuterol  inhaler every 4-6 hours and right before bed for the next 48 hours.  Then you can use your albuterol  inhaler as needed.  Do not drive or operate heavy machinery while taking a prescription cough medication as this medication can cause you to be drowsy.  You declined a chest xray today.  Return to the urgent care or see your primary care provider, if your symptoms are not improving or you get new symptoms.     ED Prescriptions     Medication Sig Dispense Auth. Provider   albuterol  (VENTOLIN  HFA) 108 (90 Base) MCG/ACT inhaler Inhale 2 puffs into the lungs every 4 (four) hours as needed for wheezing or shortness of breath. 6.7 g Adiya Selmer, DO   chlorpheniramine-HYDROcodone  (TUSSIONEX) 10-8 MG/5ML Take 5 mLs by mouth every 12 (twelve) hours as needed. 115 mL Louvenia Golomb, DO   predniSONE  (DELTASONE ) 10 MG tablet Take 6 tabs by mouth daily for 2 days, then 5 tabs for 2 days, then 4 tabs for2 days, then 3 tabs for 2 days, then 2 tabs for 2 days, then 1 tab for 2 days. 42 tablet Micheline Markes, DO      I have reviewed the PDMP during this encounter.       [1]  Social History Tobacco Use   Smoking status: Former    Current packs/day: 0.50    Types: Cigarettes   Smokeless tobacco: Never  Vaping Use   Vaping  status: Every Day  Substance Use Topics   Alcohol use: No   Drug use: No     Kriste Berth, DO 03/31/24 1113  "

## 2024-03-31 NOTE — ED Triage Notes (Signed)
 Cough and nasal congestion/yellowish mucus X one month. .RX nasal spray and Mucinex  not helping.

## 2024-03-31 NOTE — Discharge Instructions (Signed)
 5 out of pharmacy to pick up your antibiotics and steroids.  Use your albuterol  inhaler every 4-6 hours and right before bed for the next 48 hours.  Then you can use your albuterol  inhaler as needed.  Do not drive or operate heavy machinery while taking a prescription cough medication as this medication can cause you to be drowsy.  You declined a chest xray today.  Return to the urgent care or see your primary care provider, if your symptoms are not improving or you get new symptoms.

## 2024-04-20 ENCOUNTER — Ambulatory Visit
